# Patient Record
Sex: Male | Born: 1960
Health system: Southern US, Community
[De-identification: ages and names within clinical notes are randomized; demographics above are authoritative.]

## PROBLEM LIST (undated history)

## (undated) DIAGNOSIS — Z87442 Personal history of urinary calculi: Secondary | ICD-10-CM

## (undated) DIAGNOSIS — K219 Gastro-esophageal reflux disease without esophagitis: Secondary | ICD-10-CM

## (undated) DIAGNOSIS — N200 Calculus of kidney: Secondary | ICD-10-CM

## (undated) DIAGNOSIS — I10 Essential (primary) hypertension: Secondary | ICD-10-CM

## (undated) DIAGNOSIS — I2510 Atherosclerotic heart disease of native coronary artery without angina pectoris: Secondary | ICD-10-CM

## (undated) DIAGNOSIS — H7290 Unspecified perforation of tympanic membrane, unspecified ear: Secondary | ICD-10-CM

## (undated) DIAGNOSIS — G4730 Sleep apnea, unspecified: Secondary | ICD-10-CM

## (undated) HISTORY — PX: NASAL SEPTUM SURGERY: SHX37

## (undated) HISTORY — DX: Unspecified perforation of tympanic membrane, unspecified ear: H72.90

## (undated) HISTORY — DX: Sleep apnea, unspecified: G47.30

## (undated) HISTORY — PX: DENTAL SURGERY: SHX609

## (undated) HISTORY — DX: Gastro-esophageal reflux disease without esophagitis: K21.9

---

## 2006-12-07 ENCOUNTER — Ambulatory Visit (HOSPITAL_COMMUNITY): Admission: RE | Admit: 2006-12-07 | Discharge: 2006-12-07 | Payer: Self-pay | Admitting: Specialist

## 2010-01-11 ENCOUNTER — Encounter (INDEPENDENT_AMBULATORY_CARE_PROVIDER_SITE_OTHER): Payer: Self-pay | Admitting: *Deleted

## 2010-01-24 ENCOUNTER — Ambulatory Visit: Payer: Self-pay | Admitting: Gastroenterology

## 2010-01-28 ENCOUNTER — Ambulatory Visit (HOSPITAL_COMMUNITY): Admission: RE | Admit: 2010-01-28 | Discharge: 2010-01-28 | Payer: Self-pay | Admitting: Gastroenterology

## 2010-02-05 ENCOUNTER — Ambulatory Visit: Payer: Self-pay | Admitting: Gastroenterology

## 2010-02-12 ENCOUNTER — Encounter: Payer: Self-pay | Admitting: Gastroenterology

## 2010-03-18 ENCOUNTER — Telehealth: Payer: Self-pay | Admitting: Gastroenterology

## 2010-03-19 ENCOUNTER — Ambulatory Visit: Payer: Self-pay | Admitting: Gastroenterology

## 2010-04-23 ENCOUNTER — Ambulatory Visit
Admission: RE | Admit: 2010-04-23 | Discharge: 2010-04-23 | Payer: Self-pay | Source: Home / Self Care | Attending: Gastroenterology | Admitting: Gastroenterology

## 2010-04-23 ENCOUNTER — Encounter: Payer: Self-pay | Admitting: Gastroenterology

## 2010-04-24 ENCOUNTER — Encounter: Payer: Self-pay | Admitting: Gastroenterology

## 2010-05-14 NOTE — Assessment & Plan Note (Signed)
Summary: gas//bloating--ch.   History of Present Illness Visit Type: Initial Consult Primary GI MD: Melvia Heaps MD [facility] Primary Provider: Merri Brunette, MD Requesting Provider: Merri Brunette, MD Chief Complaint: Bloating  & gas x 3months History of Present Illness:   Luke Smith is a pleasant 50 year old white male referred at the request of Dr. Renne Crigler for evaluation of abdominal discomfort.  Approximately 3 months ago he and his wife developed an acute diarrheal illness accompanied  by nausea.  Since that time he has been complaining of excess belching and generalized abdominal discomfort.  He has had a few subsequent limited bouts of diarrhea.  He has rare pyrosis.  He recently  took an acid blocker without much improvement.  He has sleep apnea and uses CPAP at night.  He feels that this clearly causes more abdominal distention and excess belching.  He received a course of Flagyl without improvement.  He denies melena or hematochezia.  Incidentally noted were abnormal liver tests.  On January 10, 2010 AST was 63 and ALT 132 phosphatase was normal.  H. pylori antibody was negative.  Hemoccults x3 were negative ALT in August, 2011 was 76.  There is no history of hepatitis.  He did receive hepatitis vaccines.  He drinks up to 4 beers on the weekend.   GI Review of Systems    Reports abdominal pain, belching, and  bloating.     Location of  Abdominal pain: generalized.    Denies acid reflux, chest pain, dysphagia with liquids, dysphagia with solids, heartburn, loss of appetite, nausea, vomiting, vomiting blood, weight loss, and  weight gain.      Reports change in bowel habits.     Denies anal fissure, black tarry stools, constipation, diarrhea, diverticulosis, fecal incontinence, heme positive stool, hemorrhoids, irritable bowel syndrome, jaundice, light color stool, liver problems, rectal bleeding, and  rectal pain. Preventive Screening-Counseling & Management  Alcohol-Tobacco     Smoking  Status: quit      Drug Use:  no.      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  Alcohol Use - yes Daily Caffeine Use Illicit Drug Use - no Smoking Status:  quit Drug Use:  no  Review of Systems       The patient complains of back pain, headaches-new, and sleeping problems.  The patient denies allergy/sinus, anemia, anxiety-new, arthritis/joint pain, blood in urine, breast changes/lumps, change in vision, confusion, cough, coughing up blood, depression-new, fainting, fatigue, fever, hearing problems, heart murmur, heart rhythm changes, itching, menstrual pain, muscle pains/cramps, night sweats, nosebleeds, pregnancy symptoms, shortness of breath, skin rash, sore throat, swelling of feet/legs, swollen lymph glands, thirst - excessive , urination - excessive , urination changes/pain, urine leakage, vision changes, and voice change.         All other systems were reviewed and were negative   Vital Signs:  Patient profile:   50 year old male Height:      69 inches Weight:      186 pounds BMI:     27.57 Pulse rate:   64 / minute Pulse rhythm:   regular BP sitting:   128 / 80  (left arm) Cuff size:   regular  Vitals Entered  By: June McMurray CMA Duncan Dull) (January 24, 2010 10:43 AM)  Physical Exam  Additional Exam:  Physical Exam He Is a Well-Developed Well-Nourished Male  skin: anicteric HEENT: normocephalic; PEERLA; no nasal or pharyngeal abnormalities neck: supple nodes: no cervical lymphadenopathy chest: clear to ausculatation and percussion heart: no murmurs, gallops, or rubs abd: soft, nontender; BS normoactive; no abdominal masses, tenderness,  organomegaly; there is mild abdominal distention and minimal tympany.  There is no succussion splash. rectal: deferred ext: no cynanosis, clubbing, edema skeletal: no deformities neuro: oriented x 3; no focal abnormalities    Impression & Recommendations:  Problem # 1:  DYSPEPSIA (ICD-536.8) Assessment New He has nonspecific symptoms that suggest both upper GI and lower GI abnormalities.  There is a  mild change in bowel habits which raises the question of a lower GI structural abnormalityt.  Alternatively, he may have ulcer or nonulcer dyspepsia.  Recommendations #1 empiric therapy with   zegerid 40 mg q.h.s. #2 colonoscopy  Risks, alternatives, and complications of the procedure, including bleeding, perforation, and possible need for surgery, were explained to the patient.  Patient's questions were answered.  Problem # 2:  NONSPECIFIC ABNORMAL RESULTS LIVR FUNCTION STUDY (ICD-794.8)  Abnormal liver tests could be due to mild hepatic steatosis.  Recommendations #1 abdominal ultrasound  Orders: Ultrasound Abdomen (UAS) Colonoscopy (Colon)  Problem # 3:  SLEEP APNEA (ICD-780.57) Assessment: Comment Only  Patient Instructions: 1)  Copy sent to : Merri Brunette, MD 2)  Your colonoscopy is scheduled for 02/05/2010 at 4pm 3)  Your Ultrasound is scheduled for 01/28/2010 at 9am at [facility] 4)  The medication list was reviewed and reconciled.  All changed / newly prescribed medications were explained.  A complete medication list was provided to the patient / caregiver. 5)  Colonoscopy and Flexible Sigmoidoscopy brochure given.  6)  Conscious Sedation brochure given.  Prescriptions: ZEGERID 40-1100 MG CAPS (OMEPRAZOLE-SODIUM BICARBONATE) take one tab q.h.s.  #30 x 2   Entered and Authorized by:   Louis Meckel MD   Signed by:   Louis Meckel MD on 01/25/2010   Method used:   Electronically to        [facility] [phone]* (retail)       [address]        [place], [place]  [address]       Ph: [phone] or [phone]       Fax: [phone]   RxID:   [id number] MOVIPREP 100 GM  SOLR (PEG-KCL-NACL-NASULF-NA ASC-C) As per prep instructions.  #1 x 0   Entered by:   Merri Ray CMA (AAMA)   Authorized by:   Louis Meckel MD   Signed by:   Merri Ray CMA (AAMA) on 01/24/2010   Method used:   Electronically to        [facility] [phone]* (retail)       [address]       [place], [place]  [address]       Ph: [phone] or [phone]       Fax: [phone]   RxID:   [id number]

## 2010-05-14 NOTE — Progress Notes (Signed)
Summary: Triage  Phone Note Call from Patient Call back at [phone]   Caller: Patient Call For: Dr. Arlyce Dice Reason for Call: Talk to Nurse Summary of Call: Pt is having problems with his stomach, he is still having abdominal discomfort and bloating, would like to discuss with nurse, had a colon in october and is still having the same problems as before the procedure Initial call taken by: [place] Johnson,  March 18, 2010 11:33 AM  Follow-up for Phone Call        Spoke with patient and gave him appointment with Dr. Arlyce Dice tomorrow 03/19/10 11am.  Selinda Michaels, RN Follow-up by: Selinda Michaels RN,  March 18, 2010 1:53 PM

## 2010-05-14 NOTE — Procedures (Signed)
Summary: Colonoscopy  Patient: Luke Smith Note: All result statuses are Final unless otherwise noted.  Tests: (1) Colonoscopy (COL)   COL Colonoscopy           DONE     [facility]     [address]     [place], [place]  [address]           COLONOSCOPY PROCEDURE REPORT           PATIENT:  [name], [name]  MR#:  [id number]     BIRTHDATE:  [birth year], 49 yrs. old  GENDER:  male           ENDOSCOPIST:  Barbette Hair. Arlyce Dice, MD     Referred by:           PROCEDURE DATE:  02/05/2010     PROCEDURE:  Colonoscopy with snare polypectomy     ASA CLASS:  Class II     INDICATIONS:  1) Abdominal pain           MEDICATIONS:   Fentanyl 75 mcg IV, Versed 7 mg IV           DESCRIPTION OF PROCEDURE:   After the risks benefits and     alternatives of the procedure were thoroughly explained, informed     consent was obtained.  Digital rectal exam was performed and     revealed no abnormalities.   The LB CF-[id number] endoscope     was introduced through the anus and advanced to the cecum, which     was identified by both the appendix and ileocecal valve, without     limitations.  The quality of the prep was excellent, using     MoviPrep.  The instrument was then slowly withdrawn as the colon     was fully examined.     <<PROCEDUREIMAGES>>           FINDINGS:  A sessile polyp was found in the descending colon. It     was 4 mm in size. Polyp was snared without cautery. Retrieval was     successful (see image13 and image15). snare polyp  Scattered     diverticula were found (see image14 and image16). sigmoid to     transverse colon  This was otherwise a normal examination of the     colon (see image2, image3, image4, image8, image11, image17, and     image18).   Retroflexed views in the rectum revealed no     abnormalities.    The time to cecum =  2.75  minutes. The scope     was then withdrawn (time =  9.75  min) from the patient and the     procedure completed.        COMPLICATIONS:  None           ENDOSCOPIC IMPRESSION:     1) 4 mm sessile polyp in the descending colon     2) Diverticula, scattered     3) Otherwise normal examination           No findings to explain abdominal discomfort.     RECOMMENDATIONS:     1) If the polyp(s) removed today are proven to be adenomatous     (pre-cancerous) polyps, you will need a repeat colonoscopy in 5     years. Otherwise you should continue to follow colorectal cancer     screening guidelines for "routine risk" patients with colonoscopy     in 10 years.  2) call office next 1-3 days to schedule followup visit in 3-4     weeks     3) begin zegerid at bedtime           REPEAT EXAM:   You will receive a letter from Dr. Arlyce Dice in 1-2     weeks, after reviewing the final pathology, with followup     recommendations.           ______________________________     Barbette Hair Arlyce Dice, MD           CC: Romero Liner, MD           n.     Rosalie Doctor:   Barbette Hair. Kaplan at 02/05/2010 04:03 PM           Page 2 of 3   Assad, [place], [phone]  Note: An exclamation mark (!) indicates a result that was not dispersed into the flowsheet. Document Creation Date: 02/05/2010 4:02 PM _______________________________________________________________________  (1) Order result status: Final Collection or observation date-time: 02/05/2010 15:55 Requested date-time:  Receipt date-time:  Reported date-time:  Referring Physician:   Ordering Physician: Melvia Heaps [phone]) Specimen Source:  Source: Launa Grill Order Number: [id number] Lab site:   Appended Document: Colonoscopy     Procedures Next Due Date:    Colonoscopy: 01/2015

## 2010-05-14 NOTE — Letter (Signed)
Summary: Patient Notice- Polyp Results   Gastroenterology  [address] [place], [place] [address]   Phone: [phone]  Fax: [phone]        February 12, 2010 MRN: [id number]    BRANTLY KALMAN [address] [place], [place]  [address]    Dear Mr. Romey,  I am pleased to inform you that the colon polyp(s) removed during your recent colonoscopy was (were) found to be benign (no cancer detected) upon pathologic examination.  I recommend you have a repeat colonoscopy examination in _5 years to look for recurrent polyps, as having colon polyps increases your risk for having recurrent polyps or even colon cancer in the future.  Should you develop new or worsening symptoms of abdominal pain, bowel habit changes or bleeding from the rectum or bowels, please schedule an evaluation with either your primary care physician or with me.  Additional information/recommendations:  __ No further action with gastroenterology is needed at this time. Please      follow-up with your primary care physician for your other healthcare      needs.  __ Please call [phone] to schedule a return visit to review your      situation.  __ Please keep your follow-up visit as already scheduled.  __x Continue treatment plan as outlined the day of your exam.  Please call us if you are having persistent problems or have questions about your condition that have not been fully answered at this time.  Sincerely,  Louis Meckel MD  This letter has been electronically signed by your physician.  Appended Document: Patient Notice- Polyp Results letter mailed

## 2010-05-14 NOTE — Letter (Signed)
Summary: Results Letter  [facility]  [address] [place], [place] [address]   Phone: [phone]  Fax: [phone]        January 24, 2010 MRN: [id number]    SAVALAS MONJE [address] [place], [place]  [address]    Dear Mr. Holquin,  It is my pleasure to have treated you recently as a new patient in my office. I appreciate your confidence and the opportunity to participate in your care.  Since I do have a busy inpatient endoscopy schedule and office schedule, my office hours vary weekly. I am, however, available for emergency calls everyday through my office. If I am not available for an urgent office appointment, another one of our gastroenterologist will be able to assist you.  My well-trained staff are prepared to help you at all times. For emergencies after office hours, a physician from our Gastroenterology section is always available through my 24 hour answering service  Once again I welcome you as a new patient and I look forward to a happy and healthy relationship             Sincerely,  Louis Meckel MD  This letter has been electronically signed by your physician.  Appended Document: Results Letter LETTER MAILED

## 2010-05-14 NOTE — Letter (Signed)
Summary: [facility] Instructions  [facility]  [address] [place], [place] [address]   Phone: [phone]  Fax: [phone]       Luke Smith    Sep 02, 1960    MRN: [id number]        Procedure Day /Date:TUESDAY 02/05/2010     Arrival Time:3PM     Procedure Time:4PM     Location of Procedure:                    X   [facility] (4th Floor)  PREPARATION FOR COLONOSCOPY WITH MOVIPREP   Starting 5 days prior to your procedure 10/20do not eat nuts, seeds, popcorn, corn, beans, peas,  salads, or any raw vegetables.  Do not take any fiber supplements (e.g. Metamucil, Citrucel, and Benefiber).  THE DAY BEFORE YOUR PROCEDURE         DATE: 02/04/2010  DAY: MONDAY  1.  Drink clear liquids the entire day-NO SOLID FOOD  2.  Do not drink anything colored red or purple.  Avoid juices with pulp.  No orange juice.  3.  Drink at least 64 oz. (8 glasses) of fluid/clear liquids during the day to prevent dehydration and help the prep work efficiently.  CLEAR LIQUIDS INCLUDE: Water Jello Ice Popsicles Tea (sugar ok, no milk/cream) Powdered fruit flavored drinks Coffee (sugar ok, no milk/cream) Gatorade Juice: apple, white grape, white cranberry  Lemonade Clear bullion, consomm, broth Carbonated beverages (any kind) Strained chicken noodle soup Hard Candy                             4.  In the morning, mix first dose of MoviPrep solution:    Empty 1 Pouch A and 1 Pouch B into the disposable container    Add lukewarm drinking water to the top line of the container. Mix to dissolve    Refrigerate (mixed solution should be used within 24 hrs)  5.  Begin drinking the prep at 5:00 p.m. The MoviPrep container is divided by 4 marks.   Every 15 minutes drink the solution down to the next mark (approximately 8 oz) until the full liter is complete.   6.  Follow completed prep with 16 oz of clear liquid of your choice (Nothing red or purple).  Continue to drink  clear liquids until bedtime.  7.  Before going to bed, mix second dose of MoviPrep solution:    Empty 1 Pouch A and 1 Pouch B into the disposable container    Add lukewarm drinking water to the top line of the container. Mix to dissolve    Refrigerate  THE DAY OF YOUR PROCEDURE      DATE: 10/25/2011DAY: TUESDAY  Beginning at 11a.m. (5 hours before procedure):         1. Every 15 minutes, drink the solution down to the next mark (approx 8 oz) until the full liter is complete.  2. Follow completed prep with 16 oz. of clear liquid of your choice.    3. You may drink clear liquids until 2PM (2 HOURS BEFORE PROCEDURE).   MEDICATION INSTRUCTIONS  Unless otherwise instructed, you should take regular prescription medications with a small sip of water   as early as possible the morning of your procedure.        OTHER INSTRUCTIONS  You will need a responsible adult at least 49 years of age to accompany you and drive you home.  This person must remain in the waiting room during your procedure.  Wear loose fitting clothing that is easily removed.  Leave jewelry and other valuables at home.  However, you may wish to bring a book to read or  an iPod/MP3 player to listen to music as you wait for your procedure to start.  Remove all body piercing jewelry and leave at home.  Total time from sign-in until discharge is approximately 2-3 hours.  You should go home directly after your procedure and rest.  You can resume normal activities the  day after your procedure.  The day of your procedure you should not:   Drive   Make legal decisions   Operate machinery   Drink alcohol   Return to work  You will receive specific instructions about eating, activities and medications before you leave.    The above instructions have been reviewed and explained to me by   _______________________    I fully understand and can verbalize these instructions _____________________________  Date _________

## 2010-05-14 NOTE — Letter (Signed)
Summary: New Patient letter  [facility]  [address] [place], [place] [address]   Phone: [phone]  Fax: [phone]       01/11/2010 MRN: [id number]  Luke Smith [address] [place], [place]  [address]  Dear Luke Smith,  Welcome to the Gastroenterology Division at [facility].    You are scheduled to see Dr.  Arlyce Dice on 03-04-10 at 9:00a.m. on the 3rd floor at [facility], [address]  We ask that you try to arrive at our office 15 minutes prior to your appointment time to allow for check-in.  We would like you to complete the enclosed self-administered evaluation form prior to your visit and bring it with you on the day of your appointment.  We will review it with you.  Also, please bring a complete list of all your medications or, if you prefer, bring the medication bottles and we will list them.  Please bring your insurance card so that we may make a copy of it.  If your insurance requires a referral to see a specialist, please bring your referral form from your primary care physician.  Co-payments are due at the time of your visit and may be paid by cash, check or credit card.     Your office visit will consist of a consult with your physician (includes a physical exam), any laboratory testing he/she may order, scheduling of any necessary diagnostic testing (e.g. x-ray, ultrasound, CT-scan), and scheduling of a procedure (e.g. Endoscopy, Colonoscopy) if required.  Please allow enough time on your schedule to allow for any/all of these possibilities.    If you cannot keep your appointment, please call [phone] to cancel or reschedule prior to your appointment date.  This allows [place] the opportunity to schedule an appointment for another patient in need of care.  If you do not cancel or reschedule by 5 p.m. the business day prior to your appointment date, you will be charged a $50.00 late cancellation/no-show fee.    Thank you for choosing  [facility] for your medical needs.  We appreciate the opportunity to care for you.  Please visit [place] at our website  to learn more about our practice.                     Sincerely,                                                             The Gastroenterology Division

## 2010-05-14 NOTE — Letter (Signed)
Summary: EGD Instructions  [facility]  [address] [place], [place] [address]   Phone: [phone]  Fax: [phone]       Luke Smith    [birth year]    MRN: [id number]       Procedure Day /Date:TUESDAY 04/23/2010     Arrival Time: 2PM     Procedure Time:3PM     Location of Procedure:                    X  [facility] (4th Floor)  PREPARATION FOR ENDOSCOPY   On 04/23/2010 THE DAY OF THE PROCEDURE:  1.   No solid foods, milk or milk products are allowed after midnight the night before your procedure.  2.   Do not drink anything colored red or purple.  Avoid juices with pulp.  No orange juice.  3.  You may drink clear liquids until1PM, which is 2 hours before your procedure.                                                                                                CLEAR LIQUIDS INCLUDE: Water Jello Ice Popsicles Tea (sugar ok, no milk/cream) Powdered fruit flavored drinks Coffee (sugar ok, no milk/cream) Gatorade Juice: apple, white grape, white cranberry  Lemonade Clear bullion, consomm, broth Carbonated beverages (any kind) Strained chicken noodle soup Hard Candy   MEDICATION INSTRUCTIONS  Unless otherwise instructed, you should take regular prescription medications with a small sip of water as early as possible the morning of your procedure           OTHER INSTRUCTIONS  You will need a responsible adult at least 49 years of age to accompany you and drive you home.   This person must remain in the waiting room during your procedure.  Wear loose fitting clothing that is easily removed.  Leave jewelry and other valuables at home.  However, you may wish to bring a book to read or an iPod/MP3 player to listen to music as you wait for your procedure to start.  Remove all body piercing jewelry and leave at home.  Total time from sign-in until discharge is approximately 2-3 hours.  You should go home directly after your  procedure and rest.  You can resume normal activities the day after your procedure.  The day of your procedure you should not:   Drive   Make legal decisions   Operate machinery   Drink alcohol   Return to work  You will receive specific instructions about eating, activities and medications before you leave.    The above instructions have been reviewed and explained to me by   _______________________    I fully understand and can verbalize these instructions _____________________________ Date _________

## 2010-05-16 NOTE — Letter (Signed)
Summary: Appt Reminder 2  [facility]  [address] [place], [place] [address]   Phone: [phone]  Fax: [phone]        April 24, 2010 MRN: [id number]    DARYLL SPISAK [address] [place], [place]  [address]    Dear Mr. Pelzer,   You have a return appointment with Dr. Arlyce Dice on 05/27/10 at 9:15am.  Please remember to bring a complete list of the medicines you are taking, your insurance card and your co-pay.  If you have to cancel or reschedule this appointment, please call before 5:00 pm the evening before to avoid a cancellation fee.  If you have any questions or concerns, please call [phone].    Sincerely,    Selinda Michaels RN  Appended Document: Appt Reminder 2 Letter is mailed to the patient's home address

## 2010-05-16 NOTE — Procedures (Signed)
Summary: Upper Endoscopy  Patient: Luke Smith Note: All result statuses are Final unless otherwise noted.  Tests: (1) Upper Endoscopy (EGD)   EGD Upper Endoscopy       DONE     [facility]     [address]     [place], [place]  [address]           ENDOSCOPY PROCEDURE REPORT           PATIENT:  Luke Smith, Luke Smith  MR#:  [id number]     BIRTHDATE:  1961/02/02, 49 yrs. old  GENDER:  male           ENDOSCOPIST:  Barbette Hair. Arlyce Dice, MD     Referred by:  Soyla Murphy. Renne Crigler, M.D.           PROCEDURE DATE:  04/23/2010     PROCEDURE:  EGD, diagnostic 43235     ASA CLASS:  Class II     INDICATIONS:  dyspepsia           MEDICATIONS:   Fentanyl 50 mcg IV, Versed 5 mg IV, glycopyrrolate     (Robinal) 0.2 mg IV, 0.6cc simethancone 0.6 cc PO     TOPICAL ANESTHETIC:  Exactacain Spray           DESCRIPTION OF PROCEDURE:   After the risks benefits and     alternatives of the procedure were thoroughly explained, informed     consent was obtained.  The [facility] [id number] [id number] endoscope was     introduced through the mouth and advanced to the third portion of     the duodenum, without limitations.  The instrument was slowly     withdrawn as the mucosa was fully examined.     <<PROCEDUREIMAGES>>           The upper, middle, and distal third of the esophagus were     carefully inspected and no abnormalities were noted. The z-line     was well seen at the GEJ. The endoscope was pushed into the fundus     which was normal including a retroflexed view. The antrum,gastric     body, first and second part of the duodenum were unremarkable (see     image1, image2, image3, image4, image5, image6, and image7).     Retroflexed views revealed no abnormalities.    The scope was then     withdrawn from the patient and the procedure completed.     COMPLICATIONS:  None           ENDOSCOPIC IMPRESSION:     1) Normal EGD     RECOMMENDATIONS:     1) Trial of amitiza bid     2) Call office next 2-3 days to  schedule an office appointment for     3-4 weeks           REPEAT EXAM:  No           ______________________________     Barbette Hair. Arlyce Dice, MD           CC:           n.     eSIGNED:   Barbette Hair. Kaplan at 04/23/2010 02:58 PM           Phoebe Perch, [id number]  Note: An exclamation mark (!) indicates a result that was not dispersed into the flowsheet. Document Creation Date: 04/23/2010 2:58 PM _______________________________________________________________________  (1) Order result status: Final Collection or observation date-time: 04/23/2010  14:53 Requested date-time:  Receipt date-time:  Reported date-time:  Referring Physician:   Ordering Physician: Melvia Heaps [phone]) Specimen Source:  Source: Launa Grill Order Number: [id number] Lab site:

## 2010-05-16 NOTE — Assessment & Plan Note (Signed)
Summary: Abdominal discomfort/LRH   History of Present Illness Visit Type: Follow-up Visit Primary GI MD: Melvia Heaps MD [facility] Primary Provider: Merri Brunette, MD Requesting Provider: Merri Brunette, MD Chief Complaint: abdominal discomfort  History of Present Illness:   Luke Smith has returned for followup of his abdominal complaints.  Colonoscopy demonstrated a 4 mm descending colon polyp which was removed.  Adenomatous changes were seen.  He continues to complain of postprandial bloating, distention and mild discomfort.  Symptoms did not improve with empiric therapy with Zegerid for one week.   He denies nausea or pyrosis.  He has excess belching.   GI Review of Systems    Reports abdominal pain, acid reflux, and  chest pain.      Denies belching, bloating, dysphagia with liquids, dysphagia with solids, heartburn, loss of appetite, nausea, vomiting, vomiting blood, weight loss, and  weight gain.        Denies anal fissure, black tarry stools, change in bowel habit, constipation, diarrhea, diverticulosis, fecal incontinence, heme positive stool, hemorrhoids, irritable bowel syndrome, jaundice, light color stool, liver problems, rectal bleeding, and  rectal pain.    Current Medications (verified): 1)  Fish Oil 1000 Mg Caps (Omega-3 Fatty Acids) .... 2 By Mouth Once Daily 2)  Vitamin C 1000 Mg Tabs (Ascorbic Acid) .Marland Kitchen.. 1 By Mouth Once Daily 3)  Vitamin D3 5000 Unit Caps (Cholecalciferol) .Marland Kitchen.. 1 By Mouth Once Daily  Allergies (verified): 1)  ! Aspirin  Past History:  Past Medical History: Reviewed history from 01/24/2010 and no changes required. Pneumonia Sleep Apnea  Past Surgical History: Reviewed history from 01/24/2010 and no changes required. Oral surgery  Family History: Reviewed history from 01/24/2010 and no changes required. Family History of Heart Disease:  Bone Cancer: Father No FH of Colon Cancer:  Social History: Reviewed history from 01/24/2010 and no changes  required. 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  Alcohol Use - yes Daily Caffeine Use Illicit Drug Use - no  Review of Systems       The patient complains of cough and sleeping problems.  The patient denies allergy/sinus, anemia, anxiety-new, arthritis/joint pain, back pain, blood in urine, breast changes/lumps, change in vision, confusion, coughing up blood, depression-new, fainting, fatigue, fever, headaches-new, hearing problems, heart murmur, heart rhythm changes, itching, muscle pains/cramps, night sweats, nosebleeds, shortness of breath, skin rash, sore throat, swelling of feet/legs, swollen lymph glands, thirst - excessive, urination - excessive, urination changes/pain, urine leakage, vision changes, and voice change.    Vital Signs:  Patient profile:   50 year old male Height:      69 inches Weight:      186 pounds BMI:     27.57 Pulse rate:   104 / minute Pulse rhythm:   regular BP sitting:   138 / 74  (left arm)  Vitals Entered By: Luke Smith NCMA (March 19, 2010 10:41 AM)  Physical Exam  Additional Exam:  On exam he is a well-developed well-nourished male  Abdominal exam there is mild distention with tympany.  There is no succussion splash.  There are no abdominal masses or organomegaly   Impression & Recommendations:  Problem # 1:  DYSPEPSIA (ICD-536.8) Assessment Unchanged  Plan upper endoscopy.  Patient was also instructed to try an over-the-counter H2 receptor antagonist  Orders: EGD (EGD)  Problem # 2:  PERSONAL HISTORY OF COLONIC POLYPS (ICD-V12.72) Plan followup colonoscopy in 2016  Problem # 3:  NONSPECIFIC ABNORMAL RESULTS LIVR FUNCTION STUDY (ICD-794.8)  Ultrasound  shows mild hepatic steatosis which would account for his LFT abnormalities.  Orders: EGD (EGD)  Patient Instructions: 1)  Copy sent to : Merri Brunette, MD 2)  Your EGD is scheduled on 04/23/2010 at 3pm 3)  Conscious Sedation brochure given.  4)  Upper  Endoscopy brochure given.  5)  The medication list was reviewed and reconciled.  All changed / newly prescribed medications were explained.  A complete medication list was provided to the patient / caregiver.

## 2010-05-16 NOTE — Miscellaneous (Signed)
  Clinical Lists Changes  Medications: Added new medication of AMITIZA 8 MCG CAPS (LUBIPROSTONE) take 1 tab two times a day - Signed Rx of AMITIZA 8 MCG CAPS (LUBIPROSTONE) take 1 tab two times a day;  #15 x 2;  Signed;  Entered by: Louis Meckel MD;  Authorized by: Louis Meckel MD;  Method used: [facility]  [address] [phone] [address] [place], [place], [place]  [address], Ph: [phone] or [phone], Fax: [phone]    Prescriptions: AMITIZA 8 MCG CAPS (LUBIPROSTONE) take 1 tab two times a day  #15 x 2   Entered and Authorized by:   Louis Meckel MD   Signed by:   Louis Meckel MD on 04/23/2010   Method used:   [facility]ron[facility] to        [facility]  [address] [phone] (retail)       [address] [place], [place]  [address]       Ph: [phone] or [phone]       Fax: [phone]   RxID:   [id number]

## 2010-05-27 ENCOUNTER — Ambulatory Visit (INDEPENDENT_AMBULATORY_CARE_PROVIDER_SITE_OTHER): Payer: BC Managed Care – PPO | Admitting: Gastroenterology

## 2010-05-27 ENCOUNTER — Encounter: Payer: Self-pay | Admitting: Gastroenterology

## 2010-05-27 DIAGNOSIS — K3189 Other diseases of stomach and duodenum: Secondary | ICD-10-CM

## 2010-05-27 DIAGNOSIS — R1013 Epigastric pain: Secondary | ICD-10-CM

## 2010-06-05 NOTE — Assessment & Plan Note (Signed)
Summary: EGD FU   History of Present Illness Visit Type: Follow-up Visit Primary GI MD: Melvia Heaps MD [facility] Primary Provider: Merri Brunette, MD Requesting Provider: Merri Brunette, MD Chief Complaint: abdominal pain LLQ, now and then History of Present Illness:   Mr. Luke Smith has returned for followup of his abdominal pain.  He is symptom-free.  He attributes his symptoms to CPAP where he feels that he was swallowing a great deal of air.  He has been off CPAP and has noted a significant improvement.  Amitiza caused diarrhea.   GI Review of Systems    Reports abdominal pain and  bloating.     Location of  Abdominal pain: LUQ.    Denies acid reflux, belching, chest pain, dysphagia with liquids, dysphagia with solids, heartburn, loss of appetite, nausea, vomiting, vomiting blood, weight loss, and  weight gain.        Denies anal fissure, black tarry stools, change in bowel habit, constipation, diarrhea, diverticulosis, fecal incontinence, heme positive stool, hemorrhoids, irritable bowel syndrome, jaundice, light color stool, liver problems, rectal bleeding, and  rectal pain.    Current Medications (verified): 1)  Fish Oil 1000 Mg Caps (Omega-3 Fatty Acids) .... 2 By Mouth Once Daily 2)  Vitamin C 1000 Mg Tabs (Ascorbic Acid) .Marland Kitchen.. 1 By Mouth Once Daily 3)  Vitamin D3 5000 Unit Caps (Cholecalciferol) .Marland Kitchen.. 1 By Mouth Once Daily  Allergies (verified): 1)  ! Aspirin  Past History:  Past Medical History: Reviewed history from 01/24/2010 and no changes required. Pneumonia Sleep Apnea  Past Surgical History: Reviewed history from 01/24/2010 and no changes required. Oral surgery  Family History: Reviewed history from 01/24/2010 and no changes required. Family History of Heart Disease:  Bone Cancer: Father No FH of Colon Cancer:  Social History: Reviewed history from 01/24/2010 and no changes required. 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  Alcohol Use -  yes Daily Caffeine Use Illicit Drug Use - no  Review of Systems  The patient denies allergy/sinus, anemia, anxiety-new, arthritis/joint pain, back pain, blood in urine, breast changes/lumps, change in vision, confusion, cough, coughing up blood, depression-new, fainting, fatigue, fever, headaches-new, hearing problems, heart murmur, heart rhythm changes, itching, menstrual pain, muscle pains/cramps, night sweats, nosebleeds, pregnancy symptoms, shortness of breath, skin rash, sleeping problems, sore throat, swelling of feet/legs, swollen lymph glands, thirst - excessive , urination - excessive , urination changes/pain, urine leakage, vision changes, and voice change.    Vital Signs:  Patient profile:   50 year old male Height:      69 inches Weight:      188.25 pounds BMI:     27.90 Pulse rate:   80 / minute Pulse rhythm:   regular BP sitting:   140 / 92  (left arm) Cuff size:   regular  Vitals Entered By: June McMurray CMA Duncan Dull) (May 27, 2010 9:22 AM)   Impression & Recommendations:  Problem # 1:  DYSPEPSIA (ICD-536.8) This is likely related to aerophagia secondary to CPAP.  Recommendations #1 I instructed the patient to talk with Dr. Renne Crigler regarding CPAP and the manipulation of the apparatus #2 simethicone p.r.n.  Patient Instructions: 1)  Copy sent to : Merri Brunette, MD 2)  Call back as needed  3)  The medication list was reviewed and reconciled.  All changed / newly prescribed medications were explained.  A complete medication list was provided to the patient / caregiver.

## 2011-12-08 ENCOUNTER — Other Ambulatory Visit: Payer: Self-pay | Admitting: Otolaryngology

## 2011-12-19 ENCOUNTER — Ambulatory Visit (HOSPITAL_BASED_OUTPATIENT_CLINIC_OR_DEPARTMENT_OTHER): Payer: BC Managed Care – PPO | Attending: Otolaryngology

## 2011-12-19 VITALS — Ht 69.0 in | Wt 185.0 lb

## 2011-12-19 DIAGNOSIS — Z9989 Dependence on other enabling machines and devices: Secondary | ICD-10-CM

## 2011-12-19 DIAGNOSIS — G4733 Obstructive sleep apnea (adult) (pediatric): Secondary | ICD-10-CM

## 2011-12-27 DIAGNOSIS — G4733 Obstructive sleep apnea (adult) (pediatric): Secondary | ICD-10-CM

## 2011-12-28 NOTE — Procedures (Signed)
NAME:  Luke Smith, LOWERS NO.:  [id number]  MEDICAL RECORD NO.:  [id number]          PATIENT TYPE:  OUT  LOCATION:  SLEEP CENTER                 FACILITY:  [facility]  PHYSICIAN:  Cierah Crader D. Maple Hudson, MD, FCCP, FACPDATE OF BIRTH:  1960-08-26  DATE OF STUDY:  12/19/2011                           NOCTURNAL POLYSOMNOGRAM  REFERRING PHYSICIAN:  Newman Pies, MD  INDICATION FOR STUDY:  Hypersomnia with sleep apnea.  EPWORTH SLEEPINESS SCORE:  9/24.  BMI 27.3, weight 185 pounds, height 69 inches, neck 15 inches.  MEDICATIONS:  Home medications are charted as "none."  SLEEP ARCHITECTURE:  Split study protocol.  During the diagnostic phase, total sleep time minutes with sleep efficiency 93.8%.  Stage I was 2.9%, stage II 97.1%, stage III absent.  REM absent.  Sleep latency 6.5 minutes, awake after sleep onset 1.5 minutes, arousal index 65.  Bedtime Medication:  Ambien.  RESPIRATORY DATA:  Split study protocol.  Apnea-hypopnea index (AHI) 77.5 per hour.  A total of 155 events was scored including 53 obstructive apneas, 3 central apneas, 96 mixed apneas.  Events were associated with nonsupine sleep position.  CPAP was then titrated to 10 CWP, AHI 0 per hour.  He wore a 1 size [employer] Pilairo mask with heated humidifier.  OXYGEN DATA:  Before CPAP, snoring was moderate with oxygen desaturation to a nadir of 63% on room air.  With CPAP titration, mean oxygen saturation held 95.1% on room air and snoring was prevented.  CARDIAC DATA:  Normal sinus rhythm.  MOVEMENT-PARASOMNIA:  No significant movement disturbance.  No bathroom trips.  IMPRESSIONS-RECOMMENDATIONS: 1. Severe obstructive sleep apnea/hypopnea syndrome, AHI 77.5 per hour     with non-supine events.  Moderate snoring and oxygen desaturation     to a nadir of 63% on room air. 2. Successful CPAP titration to 10 CWP, AHI 0 per hour.  He wore a One     Size [employer] Paykel Pilairo mask with heated humidifier.   Snoring     was prevented and mean oxygen saturation held 95.7% on room air.     Aftin Lye D. Maple Hudson, MD, [facility], FACP Diplomate, American Board of Sleep Medicine    CDY/MEDQ  D:  12/27/2011 09:21:15  T:  12/28/2011 04:01:58  Job:  [id number]

## 2011-12-29 ENCOUNTER — Encounter (HOSPITAL_COMMUNITY): Payer: Self-pay | Admitting: Respiratory Therapy

## 2011-12-30 ENCOUNTER — Encounter (HOSPITAL_BASED_OUTPATIENT_CLINIC_OR_DEPARTMENT_OTHER): Payer: BC Managed Care – PPO

## 2011-12-31 ENCOUNTER — Encounter (HOSPITAL_COMMUNITY): Admission: RE | Admit: 2011-12-31 | Payer: BC Managed Care – PPO | Source: Ambulatory Visit

## 2012-01-07 ENCOUNTER — Encounter (HOSPITAL_COMMUNITY): Admission: RE | Payer: Self-pay | Source: Ambulatory Visit

## 2012-01-07 ENCOUNTER — Ambulatory Visit (HOSPITAL_COMMUNITY): Admission: RE | Admit: 2012-01-07 | Payer: BC Managed Care – PPO | Source: Ambulatory Visit | Admitting: Otolaryngology

## 2012-01-07 SURGERY — UPPP (UVULOPALATOPHARYNGOPLASTY)
Anesthesia: General

## 2012-08-24 ENCOUNTER — Other Ambulatory Visit: Payer: Self-pay | Admitting: Registered Nurse

## 2012-09-02 ENCOUNTER — Encounter: Payer: Self-pay | Admitting: Cardiovascular Disease

## 2012-09-02 ENCOUNTER — Ambulatory Visit (INDEPENDENT_AMBULATORY_CARE_PROVIDER_SITE_OTHER): Payer: BC Managed Care – PPO | Admitting: Cardiovascular Disease

## 2012-09-02 ENCOUNTER — Encounter: Payer: Self-pay | Admitting: Cardiology

## 2012-09-02 VITALS — BP 116/80 | HR 76 | Ht 69.0 in | Wt 186.0 lb

## 2012-09-02 DIAGNOSIS — R9439 Abnormal result of other cardiovascular function study: Secondary | ICD-10-CM

## 2012-09-02 NOTE — Patient Instructions (Signed)
Your physician wants you to follow-up in:  12 months.  You will receive a reminder letter in the mail two months in advance. If you don't receive a letter, please call our office to schedule the follow-up appointment.   

## 2012-09-02 NOTE — Assessment & Plan Note (Signed)
Patient had a met test that showed an ischemic response with a flattened VO2 curve and elevated heart rate suggesting microvascular ischemia

## 2012-09-02 NOTE — Progress Notes (Signed)
09/02/2012 AUDIEL SCHEIBER   1960/07/21  [id number]  Primary Physician Londell Moh, MD Primary Cardiologist: Runell Gess MD Roseanne Reno   HPI:  Mr. Mizrahi is a playful fit-appearing 52 year old married Caucasian male father of 2 who works as a Sales promotion account executive at [employer]. He was referred by Dr. Merri Brunette for cardiovascular evaluation because of an abnormal MET test. His Crestor such profiles positive for remote tobacco abuse having quit 11 years ago and smoked 10 pack years prior to that. There is no family history of heart disease. Never had a heart attack or stroke. He does have obstructive sleep apnea intolerant to CPAP. He specifically denies chest pain or shortness of breath.   Current Outpatient Prescriptions  Medication Sig Dispense Refill  . Cholecalciferol (VITAMIN D3) 5000 UNITS TABS Take 5,000 Units by mouth daily.      . fish oil-omega-3 fatty acids 1000 MG capsule Take 1 g by mouth daily.      Marland Kitchen HYDROcodone-acetaminophen (NORCO/VICODIN) 5-325 MG per tablet Take 1 tablet by mouth at bedtime as needed.      . methocarbamol (ROBAXIN) 500 MG tablet Take 1 tablet by mouth at bedtime as needed.      . zolpidem (AMBIEN) 10 MG tablet Take 1 tablet by mouth at bedtime as needed.       No current facility-administered medications for this visit.    Allergies  Allergen Reactions  . Aspirin Other (See Comments)    REACTION: stomach upset    History   Social History  . Marital Status: Married    Spouse Name: N/A    Number of Children: N/A  . Years of Education: N/A   Occupational History  . Not on file.   Social History Main Topics  . Smoking status: Former Smoker -- 0.50 packs/day for 20 years    Types: Cigarettes    Quit date: 04/14/2001  . Smokeless tobacco: Not on file  . Alcohol Use: 3.6 oz/week    6 Cans of beer per week  . Drug Use: No  . Sexually Active: Not on file   Other Topics Concern  . Not on file    Social History Narrative  . No narrative on file     Review of Systems: General: negative for chills, fever, night sweats or weight changes.  Cardiovascular: negative for chest pain, dyspnea on exertion, edema, orthopnea, palpitations, paroxysmal nocturnal dyspnea or shortness of breath Dermatological: negative for rash Respiratory: negative for cough or wheezing Urologic: negative for hematuria Abdominal: negative for nausea, vomiting, diarrhea, bright red blood per rectum, melena, or hematemesis Neurologic: negative for visual changes, syncope, or dizziness All other systems reviewed and are otherwise negative except as noted above.    Blood pressure 116/80, pulse 76, height 5\' 9"  (1.753 m), weight 186 lb (84.369 kg).  General appearance: alert and no distress Neck: no adenopathy, no carotid bruit, no JVD, supple, symmetrical, trachea midline and thyroid not enlarged, symmetric, no tenderness/mass/nodules Lungs: clear to auscultation bilaterally Heart: regular rate and rhythm, S1, S2 normal, no murmur, click, rub or gallop Abdomen: soft, non-tender; bowel sounds normal; no masses,  no organomegaly Extremities: extremities normal, atraumatic, no cyanosis or edema Pulses: 2+ and symmetric  EKG normal sinus rhythm without ST or T wave changes.  ASSESSMENT AND PLAN:   Abnormal stress test Patient had a met test that showed an ischemic response with a flattened VO2 curve and elevated heart rate suggesting microvascular ischemia  Runell Gess MD FACP,FACC,FAHA, [facility] 09/02/2012 4:43 PM

## 2014-12-22 ENCOUNTER — Encounter: Payer: Self-pay | Admitting: Gastroenterology

## 2014-12-26 ENCOUNTER — Encounter: Payer: Self-pay | Admitting: Gastroenterology

## 2015-02-07 ENCOUNTER — Telehealth: Payer: Self-pay | Admitting: Gastroenterology

## 2015-02-08 NOTE — Telephone Encounter (Signed)
Happy to see him.  Please offer my next available ngi appt, not with extender and please do not double book. thanks

## 2015-02-08 NOTE — Telephone Encounter (Signed)
Please see Dr. Ardis Hughs note below and schedule pt

## 2015-02-08 NOTE — Telephone Encounter (Signed)
Previous Deatra Ina pt requesting to see Dr. Ardis Hughs. Pt was offered Nandigam and Armbruster but would like to see Dr. Ardis Hughs. Will you accept this pt? Please advise.

## 2015-11-26 ENCOUNTER — Telehealth: Payer: Self-pay | Admitting: Cardiology

## 2015-11-26 NOTE — Telephone Encounter (Signed)
Received records from [facility] for appointment on 11/29/15 with Kerin Ransom, [place].  Records given to [employer] (medical records) for Luke's schedule on 11/29/15. lp

## 2015-11-29 ENCOUNTER — Encounter: Payer: Self-pay | Admitting: Cardiology

## 2015-11-29 ENCOUNTER — Ambulatory Visit (INDEPENDENT_AMBULATORY_CARE_PROVIDER_SITE_OTHER): Payer: BLUE CROSS/BLUE SHIELD | Admitting: Cardiology

## 2015-11-29 VITALS — BP 140/88 | HR 79 | Ht 69.0 in | Wt 192.4 lb

## 2015-11-29 DIAGNOSIS — G4730 Sleep apnea, unspecified: Secondary | ICD-10-CM | POA: Diagnosis not present

## 2015-11-29 DIAGNOSIS — Z87891 Personal history of nicotine dependence: Secondary | ICD-10-CM | POA: Insufficient documentation

## 2015-11-29 DIAGNOSIS — R7989 Other specified abnormal findings of blood chemistry: Secondary | ICD-10-CM | POA: Diagnosis not present

## 2015-11-29 DIAGNOSIS — R945 Abnormal results of liver function studies: Secondary | ICD-10-CM

## 2015-11-29 DIAGNOSIS — R072 Precordial pain: Secondary | ICD-10-CM | POA: Diagnosis not present

## 2015-11-29 DIAGNOSIS — R079 Chest pain, unspecified: Secondary | ICD-10-CM | POA: Diagnosis not present

## 2015-11-29 DIAGNOSIS — Z720 Tobacco use: Secondary | ICD-10-CM

## 2015-11-29 NOTE — Assessment & Plan Note (Signed)
Not compliant with C-pap recently

## 2015-11-29 NOTE — Assessment & Plan Note (Signed)
Quit 15 yrs ago

## 2015-11-29 NOTE — Patient Instructions (Addendum)
Procedures  Your physician has requested that you have en exercise stress myoview. For further information please visit [email or web address].  --As soon as possible. He is going out of the country next week.--   Labwork  Your physician recommends that you return for lab work the morning of your test.   Follow-Up  Your physician recommends that you schedule a follow-up appointment in: 6 weeks with Dr. Gwenlyn Found.  If you need a refill on your cardiac medications before your next appointment, please call your pharmacy.

## 2015-11-29 NOTE — Assessment & Plan Note (Signed)
Referred by Dr Shelia Media for chest pain

## 2015-11-29 NOTE — Progress Notes (Signed)
11/29/2015 Luke Smith   1961/02/24  [id number]  Primary Physician Horatio Pel, MD Primary Cardiologist: Dr Gwenlyn Found  HPI:  55 y/o Caucasian male with a history of an abnormal Met test in 2014, though the pt says he feels that test was not accurate secondary to technical problems during the test. Dr Gwenlyn Found felt he might have microvascular ischemia. The pt had no further work up and his LOV with Dr berry was 2014.           He is referred today by Dr Shelia Media for evaluation of chest pain. 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 Several weeks ago he developed a perforated ear drum from flying and was placed on antibiotics. Since he has been flying so frequently he has also developed a flair up chronic back pain and has taken Hydrocodone off and on. # weeks ago he was walking with his wife when he developed SSCP and Lt arm numbness. He didn't say anything then, "just walked through it". A couple of weeks later he was in the [place] airport and developed SSCP again, this time almost immediately after taking a Hydrocodone for back pain. He denies any associated nausea, vomiting, or diaphoresis.     Current Outpatient Prescriptions  Medication Sig Dispense Refill  . aspirin 81 MG EC tablet Take 81 mg by mouth daily.     . Cholecalciferol (VITAMIN D3) 5000 UNITS TABS Take 5,000 Units by mouth daily.    . cyclobenzaprine (FLEXERIL) 10 MG tablet Take 10 mg by mouth 3 (three) times daily as needed for muscle spasms.    . fish oil-omega-3 fatty acids 1000 MG capsule Take 1 g by mouth daily.    Marland Kitchen HYDROcodone-acetaminophen (NORCO/VICODIN) 5-325 MG per tablet Take 1 tablet by mouth at bedtime as needed.    . nitroGLYCERIN (NITROSTAT) 0.4 MG SL tablet Place 0.4 mg under the tongue every 5 (five) minutes as needed for chest pain (x 3 doses).     Marland Kitchen omeprazole (PRILOSEC) 40 MG capsule Take 40 mg  by mouth daily.     Marland Kitchen zolpidem (AMBIEN) 10 MG tablet Take 1 tablet by mouth at bedtime as needed.     No current facility-administered medications for this visit.     Allergies  Allergen Reactions  . Aspirin Other (See Comments)    REACTION: acid reflux   FM Hx-no immediate family history of CAD but he has an uncle that had CAD  Social History   Social History  . Marital status: Married    Spouse name: N/A  . Number of children: N/A  . Years of education: N/A   Occupational History  . Not on file.   Social History Main Topics  . Smoking status: Former Smoker    Packs/day: 0.50    Years: 20.00    Types: Cigarettes    Quit date: 04/14/2001  . Smokeless tobacco: Not on file  . Alcohol use 3.6 oz/week    6 Cans of beer per week  . Drug use: No  . Sexual activity: Not on file   Other Topics Concern  . Not on file   Social History Narrative  . No narrative on file     Review of Systems: General: negative for chills, fever, night sweats or weight changes.  Cardiovascular: negative for chest pain, dyspnea on exertion, edema, orthopnea, palpitations, paroxysmal nocturnal dyspnea  or shortness of breath Dermatological: negative for rash Respiratory: negative for cough or wheezing Urologic: negative for hematuria Abdominal: negative for nausea, vomiting, diarrhea, bright red blood per rectum, melena, or hematemesis Neurologic: negative for visual changes, syncope, or dizziness He admits he has not been using his C-pap lat ley All other systems reviewed and are otherwise negative except as noted above.    Blood pressure 140/88, pulse 79, height _0  (1.753 m), weight 192 lb 6.4 oz (87.3 kg).  General appearance: alert, cooperative and no distress Neck: no carotid bruit and no JVD Lungs: clear to auscultation bilaterally Heart: regular rate and rhythm Abdomen: soft, non-tender; bowel sounds normal; no masses,  no organomegaly Extremities: extremities normal, atraumatic,  no cyanosis or edema Pulses: 2+ and symmetric Skin: Skin color, texture, turgor normal. No rashes or lesions Neurologic: Grossly normal  EKG NSR  ASSESSMENT AND PLAN:   Chest pain with moderate risk of acute coronary syndrome Referred by Dr Shelia Media for chest pain. The episode he had when walking sounds suspicious for angina to me.   Sleep apnea Not compliant with C-pap recently  History of smoking Quit 15 yrs ago   PLAN  Will proceed with with Exercise Myoview. He is on a baby ASA and PPI. I ordered fasting lipids the AM of his test. I did not add a beta blocker since he will have an exercise test in the day or two, will consider adding based on stress test results.   Kerin Ransom PA-C 11/29/2015 9:34 AM

## 2015-11-30 ENCOUNTER — Telehealth (HOSPITAL_COMMUNITY): Payer: Self-pay

## 2015-11-30 NOTE — Telephone Encounter (Signed)
Encounter complete. 

## 2015-12-05 ENCOUNTER — Ambulatory Visit (HOSPITAL_COMMUNITY)
Admission: RE | Admit: 2015-12-05 | Discharge: 2015-12-05 | Disposition: A | Discharge status: Home / Self Care | Payer: BLUE CROSS/BLUE SHIELD | Source: Ambulatory Visit | Attending: Cardiovascular Disease | Admitting: Cardiovascular Disease

## 2015-12-05 DIAGNOSIS — Z87891 Personal history of nicotine dependence: Secondary | ICD-10-CM | POA: Diagnosis not present

## 2015-12-05 DIAGNOSIS — R9439 Abnormal result of other cardiovascular function study: Secondary | ICD-10-CM | POA: Insufficient documentation

## 2015-12-05 DIAGNOSIS — Z8249 Family history of ischemic heart disease and other diseases of the circulatory system: Secondary | ICD-10-CM | POA: Diagnosis not present

## 2015-12-05 DIAGNOSIS — Z6828 Body mass index (BMI) 28.0-28.9, adult: Secondary | ICD-10-CM | POA: Insufficient documentation

## 2015-12-05 DIAGNOSIS — G4733 Obstructive sleep apnea (adult) (pediatric): Secondary | ICD-10-CM | POA: Diagnosis not present

## 2015-12-05 DIAGNOSIS — E663 Overweight: Secondary | ICD-10-CM | POA: Insufficient documentation

## 2015-12-05 DIAGNOSIS — R072 Precordial pain: Secondary | ICD-10-CM | POA: Diagnosis not present

## 2015-12-05 LAB — MYOCARDIAL PERFUSION IMAGING
Estimated workload: 11.4 METS
Exercise duration (min): 10 min
Exercise duration (sec): 16 s
LV dias vol: 75 mL (ref 62–150)
LV sys vol: 33 mL
MPHR: 166 {beats}/min
Peak HR: 157 {beats}/min
Percent HR: 94 %
RPE: 17
Rest HR: 73 {beats}/min
SDS: 4
SRS: 4
SSS: 8
TID: 0.96

## 2015-12-05 MED ORDER — TECHNETIUM TC 99M TETROFOSMIN IV KIT
29.6000 | PACK | Freq: Once | INTRAVENOUS | Status: AC | PRN
  Administered 2015-12-05: 29.6 via INTRAVENOUS
  Filled 2015-12-05: qty 30

## 2015-12-05 MED ORDER — TECHNETIUM TC 99M TETROFOSMIN IV KIT
10.3000 | PACK | Freq: Once | INTRAVENOUS | Status: AC | PRN
  Administered 2015-12-05: 10.3 via INTRAVENOUS
  Filled 2015-12-05: qty 10

## 2016-01-04 LAB — COMPLETE METABOLIC PANEL WITH GFR
ALT: 87 U/L — ABNORMAL HIGH (ref 9–46)
AST: 44 U/L — ABNORMAL HIGH (ref 10–35)
Albumin: 4.6 g/dL (ref 3.6–5.1)
Alkaline Phosphatase: 57 U/L (ref 40–115)
BUN: 14 mg/dL (ref 7–25)
CO2: 26 mmol/L (ref 20–31)
Calcium: 9.3 mg/dL (ref 8.6–10.3)
Chloride: 102 mmol/L (ref 98–110)
Creat: 1.23 mg/dL (ref 0.70–1.33)
GFR, Est African American: 76 mL/min (ref >=60)
GFR, Est Non African American: 66 mL/min (ref >=60)
Glucose, Bld: 114 mg/dL — ABNORMAL HIGH (ref 65–99)
Potassium: 4.4 mmol/L (ref 3.5–5.3)
Sodium: 139 mmol/L (ref 135–146)
Total Bilirubin: 0.7 mg/dL (ref 0.2–1.2)
Total Protein: 7 g/dL (ref 6.1–8.1)

## 2016-01-04 LAB — LIPID PANEL
Cholesterol: 234 mg/dL — ABNORMAL HIGH (ref 125–200)
HDL: 43 mg/dL (ref >=40)
LDL Cholesterol: 139 mg/dL — ABNORMAL HIGH (ref <130)
Total CHOL/HDL Ratio: 5.4 Ratio — ABNORMAL HIGH (ref <=5.0)
Triglycerides: 259 mg/dL — ABNORMAL HIGH (ref <150)
VLDL: 52 mg/dL — ABNORMAL HIGH (ref <30)

## 2016-01-10 ENCOUNTER — Encounter: Payer: Self-pay | Admitting: Gastroenterology

## 2016-01-10 ENCOUNTER — Ambulatory Visit (INDEPENDENT_AMBULATORY_CARE_PROVIDER_SITE_OTHER): Payer: BLUE CROSS/BLUE SHIELD | Admitting: Gastroenterology

## 2016-01-10 VITALS — BP 160/88 | HR 84 | Ht 69.0 in | Wt 189.0 lb

## 2016-01-10 DIAGNOSIS — Z1211 Encounter for screening for malignant neoplasm of colon: Secondary | ICD-10-CM

## 2016-01-10 DIAGNOSIS — Z860100 Personal history of colon polyps, unspecified: Secondary | ICD-10-CM

## 2016-01-10 DIAGNOSIS — R0789 Other chest pain: Secondary | ICD-10-CM | POA: Diagnosis not present

## 2016-01-10 DIAGNOSIS — K219 Gastro-esophageal reflux disease without esophagitis: Secondary | ICD-10-CM | POA: Diagnosis not present

## 2016-01-10 MED ORDER — DEXLANSOPRAZOLE 60 MG PO CPDR: 60.0000 mg | DELAYED_RELEASE_CAPSULE | Freq: Every day | ORAL | 3 refills | Status: DC

## 2016-01-10 MED ORDER — RANITIDINE HCL 300 MG PO TABS: 300.0000 mg | ORAL_TABLET | Freq: Every day | ORAL | 3 refills | Status: DC

## 2016-01-10 NOTE — Patient Instructions (Signed)
You have been scheduled for an endoscopy and colonoscopy. Please follow the written instructions given to you at your visit today. Please pick up your prep supplies at the pharmacy within the next 1-3 days. If you use inhalers (even only as needed), please bring them with you on the day of your procedure. Your physician has requested that you go to www.startemmi.com and enter the access code given to you at your visit today. This web site gives a general overview about your procedure. However, you should still follow specific instructions given to you by our office regarding your preparation for the procedure.   You have been scheduled for an Upper GI Series at 02/01/2016. Your appointment is on 10:30am at [facility] . Please arrive 15 minutes prior to your test for registration. Make sure not to eat or drink anything after midnight on the night before your test. If you need to reschedule, please call radiology at [phone]. ________________________________________________________________ An upper GI series uses x rays to help diagnose problems of the upper GI tract, which includes the esophagus, stomach, and duodenum. The duodenum is the first part of the small intestine. An upper GI series is conducted by a radiology technologist or a radiologist-a doctor who specializes in x-ray imaging-at a hospital or outpatient center. While sitting or standing in front of an x-ray machine, the patient drinks barium liquid, which is often white and has a chalky consistency and taste. The barium liquid coats the lining of the upper GI tract and makes signs of disease show up more clearly on x rays. X-ray video, called fluoroscopy, is used to view the barium liquid moving through the esophagus, stomach, and duodenum. Additional x rays and fluoroscopy are performed while the patient lies on an x-ray table. To fully coat the upper GI tract with barium liquid, the technologist or radiologist may press on  the abdomen or ask the patient to change position. Patients hold still in various positions, allowing the technologist or radiologist to take x rays of the upper GI tract at different angles. If a technologist conducts the upper GI series, a radiologist will later examine the images to look for problems.  This test typically takes about 1 hour to complete.     We will send Dexilant to your pharmacy  We will send Zantac to your pharmacy   Gastroesophageal Reflux Disease, Adult Normally, food travels down the esophagus and stays in the stomach to be digested. However, when a person has gastroesophageal reflux disease (GERD), food and stomach acid move back up into the esophagus. When this happens, the esophagus becomes sore and inflamed. Over time, GERD can create small holes (ulcers) in the lining of the esophagus.  CAUSES This condition is caused by a problem with the muscle between the esophagus and the stomach (lower esophageal sphincter, or LES). Normally, the LES muscle closes after food passes through the esophagus to the stomach. When the LES is weakened or abnormal, it does not close properly, and that allows food and stomach acid to go back up into the esophagus. The LES can be weakened by certain dietary substances, medicines, and medical conditions, including:  Tobacco use.  Pregnancy.  Having a hiatal hernia.  Heavy alcohol use.  Certain foods and beverages, such as coffee, chocolate, onions, and peppermint. RISK FACTORS This condition is more likely to develop in:  People who have an increased body weight.  People who have connective tissue disorders.  People who use NSAID medicines. SYMPTOMS Symptoms of  this condition include:  Heartburn.  Difficult or painful swallowing.  The feeling of having a lump in the throat.  Abitter taste in the mouth.  Bad breath.  Having a large amount of saliva.  Having an upset or bloated stomach.  Belching.  Chest  pain.  Shortness of breath or wheezing.  Ongoing (chronic) cough or a night-time cough.  Wearing away of tooth enamel.  Weight loss. Different conditions can cause chest pain. Make sure to see your health care provider if you experience chest pain. DIAGNOSIS Your health care provider will take a medical history and perform a physical exam. To determine if you have mild or severe GERD, your health care provider may also monitor how you respond to treatment. You may also have other tests, including:  An endoscopy toexamine your stomach and esophagus with a small camera.  A test thatmeasures the acidity level in your esophagus.  A test thatmeasures how much pressure is on your esophagus.  A barium swallow or modified barium swallow to show the shape, size, and functioning of your esophagus. TREATMENT The goal of treatment is to help relieve your symptoms and to prevent complications. Treatment for this condition may vary depending on how severe your symptoms are. Your health care provider may recommend:  Changes to your diet.  Medicine.  Surgery. HOME CARE INSTRUCTIONS Diet  Follow a diet as recommended by your health care provider. This may involve avoiding foods and drinks such as:  Coffee and tea (with or without caffeine).  Drinks that containalcohol.  Energy drinks and sports drinks.  Carbonated drinks or sodas.  Chocolate and cocoa.  Peppermint and mint flavorings.  Garlic and onions.  Horseradish.  Spicy and acidic foods, including peppers, chili powder, curry powder, vinegar, hot sauces, and barbecue sauce.  Citrus fruit juices and citrus fruits, such as oranges, lemons, and limes.  Tomato-based foods, such as red sauce, chili, salsa, and pizza with red sauce.  Fried and fatty foods, such as donuts, french fries, potato chips, and high-fat dressings.  High-fat meats, such as hot dogs and fatty cuts of red and white meats, such as rib eye steak,  sausage, ham, and bacon.  High-fat dairy items, such as whole milk, butter, and cream cheese.  Eat small, frequent meals instead of large meals.  Avoid drinking large amounts of liquid with your meals.  Avoid eating meals during the 2-3 hours before bedtime.  Avoid lying down right after you eat.  Do not exercise right after you eat. General Instructions  Pay attention to any changes in your symptoms.  Take over-the-counter and prescription medicines only as told by your health care provider. Do not take aspirin, ibuprofen, or other NSAIDs unless your health care provider told you to do so.  Do not use any tobacco products, including cigarettes, chewing tobacco, and e-cigarettes. If you need help quitting, ask your health care provider.  Wear loose-fitting clothing. Do not wear anything tight around your waist that causes pressure on your abdomen.  Raise (elevate) the head of your bed 6 inches (15cm).  Try to reduce your stress, such as with yoga or meditation. If you need help reducing stress, ask your health care provider.  If you are overweight, reduce your weight to an amount that is healthy for you. Ask your health care provider for guidance about a safe weight loss goal.  Keep all follow-up visits as told by your health care provider. This is important. SEEK MEDICAL CARE IF:  You have  new symptoms.  You have unexplained weight loss.  You have difficulty swallowing, or it hurts to swallow.  You have wheezing or a persistent cough.  Your symptoms do not improve with treatment.  You have a hoarse voice. SEEK IMMEDIATE MEDICAL CARE IF:  You have pain in your arms, neck, jaw, teeth, or back.  You feel sweaty, dizzy, or light-headed.  You have chest pain or shortness of breath.  You vomit and your vomit looks like blood or coffee grounds.  You faint.  Your stool is bloody or black.  You cannot swallow, drink, or eat.   This information is not intended to  replace advice given to you by your health care provider. Make sure you discuss any questions you have with your health care provider.   Document Released: 01/08/2005 Document Revised: 12/20/2014 Document Reviewed: 07/26/2014 Elsevier Interactive Patient Education [employer].      __________________________________________________________________

## 2016-01-10 NOTE — Progress Notes (Signed)
JARICK HARKINS    [id number]    08/28/1960  Primary Care Physician:PHARR,WALTER DAVIDSON, MD  Referring Physician: Deland Pretty, MD [address] [place], [place] [address]  Chief complaint:  GERD, atypical chest pain, surveillance colonsocopy HPI: 59 yr M followed by Dr Deatra Ina in the past here with c/o severe heartburn and had an episode of chest pain, that was relieved after nitroglycerine was similar to the episodes he has had in the past few months, underwent cardiac stress test that was negative for any ischemic heart disease. He also has regurgitation, intermittent cough and sore throat associated with acid taste in the mouth. PPI dose was increased to twice daily with no significant improvement. Denies any dysphagia or odynophagia. Denies any nausea, vomiting, abdominal pain, melena or bright red blood per rectum. Last colonoscopy in 2011 with removal of polyps is due for surveillance colonoscopy.    Outpatient Encounter Prescriptions as of 01/10/2016  Medication Sig  . aspirin 81 MG EC tablet Take 81 mg by mouth daily.   . Cholecalciferol (VITAMIN D3) 5000 UNITS TABS Take 5,000 Units by mouth daily.  . cyclobenzaprine (FLEXERIL) 10 MG tablet Take 10 mg by mouth 3 (three) times daily as needed for muscle spasms.  . fish oil-omega-3 fatty acids 1000 MG capsule Take 1 g by mouth daily.  Marland Kitchen HYDROcodone-acetaminophen (NORCO/VICODIN) 5-325 MG per tablet Take 1 tablet by mouth at bedtime as needed.  . nitroGLYCERIN (NITROSTAT) 0.4 MG SL tablet Place 0.4 mg under the tongue every 5 (five) minutes as needed for chest pain (x 3 doses).   . pantoprazole (PROTONIX) 40 MG tablet Take 40 mg by mouth 2 (two) times daily.  Marland Kitchen zolpidem (AMBIEN) 10 MG tablet Take 1 tablet by mouth at bedtime as needed.  . [DISCONTINUED] omeprazole (PRILOSEC) 40 MG capsule Take 40 mg by mouth daily.    No facility-administered encounter medications on file as of 01/10/2016.     Allergies as  of 01/10/2016 - Review Complete 01/10/2016  Allergen Reaction Noted  . Aspirin Other (See Comments)     Past Medical History:  Diagnosis Date  . GERD (gastroesophageal reflux disease)   . Perforated ear drum    left ear  . Shortness of breath 05/24/2012   Abnormal MET TEST  . Sleep apnea     Past Surgical History:  Procedure Laterality Date  . NASAL SEPTUM SURGERY      Family History  Problem Relation Age of Onset  . Bone cancer Father   . Colon cancer Neg Hx     Social History   Social History  . Marital status: Married    Spouse name: N/A  . Number of children: 2  . Years of education: N/A   Occupational History  . engineer    Social History Main Topics  . Smoking status: Former Smoker    Packs/day: 0.50    Years: 20.00    Types: Cigarettes    Quit date: 04/14/2001  . Smokeless tobacco: Never Used  . Alcohol use 3.6 oz/week    6 Cans of beer per week     Comment: weekends  . Drug use: No  . Sexual activity: Not on file   Other Topics Concern  . Not on file   Social History Narrative  . No narrative on file      Review of systems: Review of Systems  Constitutional: Negative for fever and chills.  HENT: Negative.  Eyes: Negative for blurred vision.  Respiratory:  Positive for cough, Negative for shortness of breath and wheezing.   Cardiovascular: Negative for chest pain and palpitations.  Gastrointestinal: as per HPI Genitourinary: Negative for dysuria, urgency, frequency and hematuria.  Musculoskeletal: Negative for myalgias, back pain and joint pain.  Skin: Negative for itching and rash.  Neurological: Negative for dizziness, tremors, focal weakness, seizures and loss of consciousness.  Endo/Heme/Allergies: Positive for seasonal allergies.  Psychiatric/Behavioral: Negative for depression, suicidal ideas and hallucinations.  All other systems reviewed and are negative.   Physical Exam: Vitals:   01/10/16 0934  BP: (!) 160/88  Pulse: 84    Body mass index is 27.91 kg/m. Gen:      No acute distress HEENT:  EOMI, sclera anicteric Neck:     No masses; no thyromegaly Lungs:    Clear to auscultation bilaterally; normal respiratory effort CV:         Regular rate and rhythm; no murmurs Abd:      + bowel sounds; soft, non-tender; no palpable masses, no distension Ext:    No edema; adequate peripheral perfusion Skin:      Warm and dry; no rash Neuro: alert and oriented x 3 Psych: normal mood and affect  Data Reviewed:  Reviewed labs, radiology imaging, old records and pertinent past GI work up   Assessment and Plan/Recommendations:  4 yr M with h/o GERD here with worsening heartburn and intermittent atypical chest pain Will switch to Dexilant 75m daily for better acid suppression and add Zantac at bedtime for nocturnal acid suppression Schedule for EGD for evaluation and r/o severe esophagitis/candida He is also past due for surveillance colonoscopy, schedule it at the same time The risks and benefits as well as alternatives of endoscopic procedure(s) have been discussed and reviewed. All questions answered. The patient agrees to proceed.  25 minutes was spent face-to-face with the patient. Greater than 50% of the time used for counseling as well as treatment plan and follow-up. She had multiple questions which were answered to her satisfaction  K. VDenzil Magnuson, MD [phone]-Fri 8a-5p [phone] 5p, weekends, holidays  CC: PDeland Pretty MD

## 2016-01-11 ENCOUNTER — Encounter: Payer: Self-pay | Admitting: Gastroenterology

## 2016-01-11 ENCOUNTER — Ambulatory Visit (AMBULATORY_SURGERY_CENTER): Payer: BLUE CROSS/BLUE SHIELD | Admitting: Gastroenterology

## 2016-01-11 VITALS — BP 135/93 | HR 75 | Temp 97.7°F | Resp 17 | Ht 69.0 in | Wt 189.0 lb

## 2016-01-11 DIAGNOSIS — Z1211 Encounter for screening for malignant neoplasm of colon: Secondary | ICD-10-CM | POA: Diagnosis not present

## 2016-01-11 DIAGNOSIS — D122 Benign neoplasm of ascending colon: Secondary | ICD-10-CM

## 2016-01-11 DIAGNOSIS — D124 Benign neoplasm of descending colon: Secondary | ICD-10-CM | POA: Diagnosis not present

## 2016-01-11 DIAGNOSIS — K219 Gastro-esophageal reflux disease without esophagitis: Secondary | ICD-10-CM | POA: Diagnosis not present

## 2016-01-11 MED ORDER — SODIUM CHLORIDE 0.9 % IV SOLN: 500.0000 mL | INTRAVENOUS | Status: DC

## 2016-01-11 MED ORDER — RANITIDINE HCL 300 MG PO TABS: 300.0000 mg | ORAL_TABLET | Freq: Every day | ORAL | 3 refills | Status: DC

## 2016-01-11 NOTE — Progress Notes (Signed)
Report given to PACU RN, vss 

## 2016-01-11 NOTE — Patient Instructions (Signed)
YOU HAD AN ENDOSCOPIC PROCEDURE TODAY AT [facility]:   Refer to the procedure report that was given to you for any specific questions about what was found during the examination.  If the procedure report does not answer your questions, please call your gastroenterologist to clarify.  If you requested that your care partner not be given the details of your procedure findings, then the procedure report has been included in a sealed envelope for you to review at your convenience later.  YOU SHOULD EXPECT: Some feelings of bloating in the abdomen. Passage of more gas than usual.  Walking can help get rid of the air that was put into your GI tract during the procedure and reduce the bloating. If you had a lower endoscopy (such as a colonoscopy or flexible sigmoidoscopy) you may notice spotting of blood in your stool or on the toilet paper. If you underwent a bowel prep for your procedure, you may not have a normal bowel movement for a few days.  Please Note:  You might notice some irritation and congestion in your nose or some drainage.  This is from the oxygen used during your procedure.  There is no need for concern and it should clear up in a day or so.  SYMPTOMS TO REPORT IMMEDIATELY:   Following lower endoscopy (colonoscopy or flexible sigmoidoscopy):  Excessive amounts of blood in the stool  Significant tenderness or worsening of abdominal pains  Swelling of the abdomen that is new, acute  Fever of 100F or higher   Following upper endoscopy (EGD)  Vomiting of blood or coffee ground material  New chest pain or pain under the shoulder blades  Painful or persistently difficult swallowing  New shortness of breath  Fever of 100F or higher  Black, tarry-looking stools  For urgent or emergent issues, a gastroenterologist can be reached at any hour by calling [phone].   DIET:  We do recommend a small meal at first, but then you may proceed to your regular diet.  Drink  plenty of fluids but you should avoid alcoholic beverages for 24 hours.  ACTIVITY:  You should plan to take it easy for the rest of today and you should NOT DRIVE or use heavy machinery until tomorrow (because of the sedation medicines used during the test).    FOLLOW UP: Our staff will call the number listed on your records the next business day following your procedure to check on you and address any questions or concerns that you may have regarding the information given to you following your procedure. If we do not reach you, we will leave a message.  However, if you are feeling well and you are not experiencing any problems, there is no need to return our call.  We will assume that you have returned to your regular daily activities without incident.  If any biopsies were taken you will be contacted by phone or by letter within the next 1-3 weeks.  Please call us at [phone] if you have not heard about the biopsies in 3 weeks.    SIGNATURES/CONFIDENTIALITY: You and/or your care partner have signed paperwork which will be entered into your electronic medical record.  These signatures attest to the fact that that the information above on your After Visit Summary has been reviewed and is understood.  Full responsibility of the confidentiality of this discharge information lies with you and/or your care-partner.  Polyp, diverticulosis, high fiber diet  And hemorrhoid information given.  Await biopsy results from upper and lower endoscopies.  No ibuprofen, naproxen, or other nonsteroidal anti inflammatory meds.  Follow up with Dr. Shelia Media for evaluation of hypertension.

## 2016-01-11 NOTE — Op Note (Addendum)
[facility] Patient Name: Luke Smith Procedure Date: 01/11/2016 10:09 AM MRN: [id number] Endoscopist: Mauri Pole , MD Age: 55 Referring MD:  Date of Birth: Feb 28, 1961 Gender: Male Account #: [id number] Procedure:                Colonoscopy Indications:              Surveillance: Personal history of adenomatous                            polyps on last colonoscopy > 5 years ago, High risk                            colon cancer surveillance: Personal history of                            adenoma less than 10 mm in size Medicines:                Monitored Anesthesia Care Procedure:                Pre-Anesthesia Assessment:                           - Prior to the procedure, a History and Physical                            was performed, and patient medications and                            allergies were reviewed. The patient's tolerance of                            previous anesthesia was also reviewed. The risks                            and benefits of the procedure and the sedation                            options and risks were discussed with the patient.                            All questions were answered, and informed consent                            was obtained. Prior Anticoagulants: The patient has                            taken no previous anticoagulant or antiplatelet                            agents. ASA Grade Assessment: II - A patient with                            mild systemic disease. After reviewing the risks  and benefits, the patient was deemed in                            satisfactory condition to undergo the procedure.                           After obtaining informed consent, the colonoscope                            was passed under direct vision. Throughout the                            procedure, the patient's blood pressure, pulse, and                            oxygen saturations were monitored  continuously. The                            Model CF-HQ190L [id number]) scope was introduced                            through the anus and advanced to the the terminal                            ileum, with identification of the appendiceal                            orifice and IC valve. The colonoscopy was performed                            without difficulty. The patient tolerated the                            procedure well. The quality of the bowel                            preparation was adequate. The ileocecal valve,                            appendiceal orifice, and rectum were photographed. Scope In: 10:23:52 AM Scope Out: 10:37:12 AM Scope Withdrawal Time: 0 hours 8 minutes 12 seconds  Total Procedure Duration: 0 hours 13 minutes 20 seconds  Findings:                 The perianal and digital rectal examinations were                            normal.                           A 9 mm polyp was found in the descending colon. The                            polyp was sessile. The polyp was removed with a  cold snare. Resection and retrieval were complete.                           A 3 mm polyp was found in the ascending colon. The                            polyp was sessile. The polyp was removed with a                            cold biopsy forceps. Resection and retrieval were                            complete.                           Scattered small and large-mouthed diverticula were                            found in the sigmoid colon, descending colon,                            transverse colon and ascending colon. There was                            evidence of diverticular spasm. There was evidence                            of an impacted diverticulum. There was no evidence                            of diverticular bleeding.                           Non-bleeding internal hemorrhoids were found during                             retroflexion. The hemorrhoids were small.                           The exam was otherwise without abnormality.                           The terminal ileum contained a single non-bleeding                            erosion. Complications:            No immediate complications. Estimated Blood Loss:     Estimated blood loss was minimal. Impression:               - One 9 mm polyp in the descending colon, removed                            with a cold snare. Resected and retrieved.                           -  One 3 mm polyp in the ascending colon, removed                            with a cold biopsy forceps. Resected and retrieved.                           - Moderate diverticulosis in the sigmoid colon, in                            the descending colon, in the transverse colon and                            in the ascending colon. There was evidence of                            diverticular spasm. There was evidence of an                            impacted diverticulum. There was no evidence of                            diverticular bleeding.                           - Non-bleeding internal hemorrhoids.                           - The examination was otherwise normal.                           - A single erosion in the terminal ileum likely                            secondary to NSAID use. Recommendation:           - Patient has a contact number available for                            emergencies. The signs and symptoms of potential                            delayed complications were discussed with the                            patient. Return to normal activities tomorrow.                            Written discharge instructions were provided to the                            patient.                           - Resume previous diet.                           -  Continue present medications.                           - Await pathology results.                           -  Repeat colonoscopy in 3 - 5 years for                            surveillance based on pathology results.                           - Return to GI clinic PRN.                           - No ibuprofen, naproxen, or other non-steroidal                            anti-inflammatory drugs.                           -Follow up with PMD for evaluation and management                            of Hypertension (Noted to have elevated BP systolic                            [id number] and diastolic [id number] throughout the procedure                            with good adequate sedation with propofol) Mauri Pole, MD 01/11/2016 10:48:19 AM This report has been signed electronically.

## 2016-01-11 NOTE — Progress Notes (Signed)
Called to room to assist during endoscopic procedure.  Patient ID and intended procedure confirmed with present staff. Received instructions for my participation in the procedure from the performing physician.  

## 2016-01-11 NOTE — Op Note (Signed)
[facility] Patient Name: Luke Smith Procedure Date: 01/11/2016 10:10 AM MRN: [id number] Endoscopist: Mauri Pole , MD Age: 55 Referring MD:  Date of Birth: [birth year] Gender: Male Account #: [id number] Procedure:                Upper GI endoscopy Indications:              Esophageal reflux symptoms that persist despite                            appropriate therapy, Esophageal reflux symptoms                            that recur despite appropriate therapy, Upper                            abdominal symptoms that persist despite an                            appropriate trial of therapy Medicines:                Monitored Anesthesia Care Procedure:                Pre-Anesthesia Assessment:                           - Prior to the procedure, a History and Physical                            was performed, and patient medications and                            allergies were reviewed. The patient's tolerance of                            previous anesthesia was also reviewed. The risks                            and benefits of the procedure and the sedation                            options and risks were discussed with the patient.                            All questions were answered, and informed consent                            was obtained. Prior Anticoagulants: The patient has                            taken no previous anticoagulant or antiplatelet                            agents. ASA Grade Assessment: II - A patient with  mild systemic disease. After reviewing the risks                            and benefits, the patient was deemed in                            satisfactory condition to undergo the procedure.                           After obtaining informed consent, the endoscope was                            passed under direct vision. Throughout the                            procedure, the patient's blood pressure,  pulse, and                            oxygen saturations were monitored continuously. The                            Model GIF-[id number]) scope was introduced                            through the mouth, and advanced to the second part                            of duodenum. The upper GI endoscopy was                            accomplished without difficulty. The patient                            tolerated the procedure well. Scope In: Scope Out: Findings:                 There were esophageal mucosal changes suspicious                            for short-segment Barrett's esophagus present in                            the lower third of the esophagus 38cm from                            incissors with irregular Z-line. The maximum                            longitudinal extent of these mucosal changes was <3                            cm in length. Mucosa was biopsied with a cold  forceps for histology. One specimen bottle was sent                            to pathology.                           The stomach was normal.                           The examined duodenum was normal. Complications:            No immediate complications. Estimated Blood Loss:     Estimated blood loss was minimal. Impression:               - Esophageal mucosal changes suspicious for                            short-segment Barrett's esophagus. Biopsied.                           - Normal stomach.                           - Normal examined duodenum. Recommendation:           - Patient has a contact number available for                            emergencies. The signs and symptoms of potential                            delayed complications were discussed with the                            patient. Return to normal activities tomorrow.                            Written discharge instructions were provided to the                            patient.                            - Resume previous diet.                           - Continue present medications.                           - Await pathology results.                           - No ibuprofen, naproxen, or other non-steroidal                            anti-inflammatory drugs.                           - Repeat upper endoscopy  after studies are complete                            for surveillance based on pathology results.                           - Return to GI clinic PRN. Mauri Pole, MD 01/11/2016 10:41:39 AM This report has been signed electronically.

## 2016-01-14 ENCOUNTER — Other Ambulatory Visit: Payer: Self-pay

## 2016-01-14 ENCOUNTER — Telehealth: Payer: Self-pay | Admitting: Gastroenterology

## 2016-01-14 ENCOUNTER — Telehealth: Payer: Self-pay

## 2016-01-14 MED ORDER — LIDOCAINE VISCOUS 2 % MT SOLN: OROMUCOSAL | 0 refills | Status: DC

## 2016-01-14 NOTE — Telephone Encounter (Signed)
Pt is calling back to see what he needs to do

## 2016-01-14 NOTE — Telephone Encounter (Signed)
  Follow up Call-  Call back number 01/11/2016  Post procedure Call Back phone  # [phone]  Permission to leave phone message Yes  Some recent data might be hidden     Patient questions:  Do you have a fever, pain , or abdominal swelling? No. Pain Score  0 *  Have you tolerated food without any problems? Yes.    Have you been able to return to your normal activities? Yes.    Do you have any questions about your discharge instructions: Diet   No. Medications  No. Follow up visit  No.  Do you have questions or concerns about your Care? No.  Actions: * If pain score is 4 or above: No action needed, pain <4.

## 2016-01-14 NOTE — Telephone Encounter (Signed)
He just had biopsies at EG junction to exclude Barrett's disease, did not have dilation or proximal esophageal biopsies. Continue PPI daily and H2 blocker at bedtime. He can take Lidocaine 2% viscous 5-10 cc as needed q4-6 hours. No eating or drinking after viscous lidocaine for 1-2 hours to prevent aspiration. Thanks

## 2016-01-14 NOTE — Telephone Encounter (Signed)
Advised the patient. Discussed soft foods, putting pills in pudding or yogurt to help swallow and liquids.

## 2016-01-14 NOTE — Telephone Encounter (Signed)
Spoke with Mr Ehly. He has a sharp sensation with swallowing. Definitely worse with solids or hot beverages. Somewhat more painful than before the procedure. Pills are extremely painful and feel like they choke him. He is taking Dexilant am and Zantac pm. Stayed home today. Not able to sleep due to his pain waking him up.

## 2016-01-14 NOTE — Telephone Encounter (Signed)
  Follow up Call-  Call back number 01/11/2016  Post procedure Call Back phone  # [phone]  Permission to leave phone message Yes  Some recent data might be hidden    Patient was called for follow up after his procedure on 01/11/2016. No answer at the number given for follow up phone call. A message was left on the answering machine.

## 2016-01-15 ENCOUNTER — Encounter (HOSPITAL_COMMUNITY): Payer: Self-pay | Admitting: Emergency Medicine

## 2016-01-15 ENCOUNTER — Encounter: Payer: Self-pay | Admitting: Cardiovascular Disease

## 2016-01-15 ENCOUNTER — Inpatient Hospital Stay (HOSPITAL_COMMUNITY)
Admission: EM | Admit: 2016-01-15 | Discharge: 2016-01-22 | DRG: 234 | Disposition: A | Discharge status: Home / Self Care | Payer: BLUE CROSS/BLUE SHIELD | Attending: Surgery | Admitting: Surgery

## 2016-01-15 ENCOUNTER — Emergency Department (HOSPITAL_COMMUNITY): Payer: BLUE CROSS/BLUE SHIELD

## 2016-01-15 ENCOUNTER — Other Ambulatory Visit: Payer: Self-pay | Admitting: *Deleted

## 2016-01-15 ENCOUNTER — Ambulatory Visit (INDEPENDENT_AMBULATORY_CARE_PROVIDER_SITE_OTHER): Payer: BLUE CROSS/BLUE SHIELD | Admitting: Cardiovascular Disease

## 2016-01-15 VITALS — BP 169/100 | HR 85 | Ht 69.0 in | Wt 190.8 lb

## 2016-01-15 DIAGNOSIS — I10 Essential (primary) hypertension: Secondary | ICD-10-CM | POA: Diagnosis present

## 2016-01-15 DIAGNOSIS — R079 Chest pain, unspecified: Secondary | ICD-10-CM

## 2016-01-15 DIAGNOSIS — Z9229 Personal history of other drug therapy: Secondary | ICD-10-CM

## 2016-01-15 DIAGNOSIS — Z79899 Other long term (current) drug therapy: Secondary | ICD-10-CM

## 2016-01-15 DIAGNOSIS — Z7901 Long term (current) use of anticoagulants: Secondary | ICD-10-CM

## 2016-01-15 DIAGNOSIS — I2510 Atherosclerotic heart disease of native coronary artery without angina pectoris: Secondary | ICD-10-CM | POA: Diagnosis present

## 2016-01-15 DIAGNOSIS — Z09 Encounter for follow-up examination after completed treatment for conditions other than malignant neoplasm: Secondary | ICD-10-CM

## 2016-01-15 DIAGNOSIS — D62 Acute posthemorrhagic anemia: Secondary | ICD-10-CM | POA: Diagnosis not present

## 2016-01-15 DIAGNOSIS — Z886 Allergy status to analgesic agent status: Secondary | ICD-10-CM

## 2016-01-15 DIAGNOSIS — Z7982 Long term (current) use of aspirin: Secondary | ICD-10-CM

## 2016-01-15 DIAGNOSIS — E8770 Fluid overload, unspecified: Secondary | ICD-10-CM | POA: Diagnosis present

## 2016-01-15 DIAGNOSIS — G4730 Sleep apnea, unspecified: Secondary | ICD-10-CM | POA: Diagnosis present

## 2016-01-15 DIAGNOSIS — R9439 Abnormal result of other cardiovascular function study: Secondary | ICD-10-CM | POA: Diagnosis present

## 2016-01-15 DIAGNOSIS — K5900 Constipation, unspecified: Secondary | ICD-10-CM | POA: Diagnosis not present

## 2016-01-15 DIAGNOSIS — Z01818 Encounter for other preprocedural examination: Secondary | ICD-10-CM

## 2016-01-15 DIAGNOSIS — E119 Type 2 diabetes mellitus without complications: Secondary | ICD-10-CM | POA: Diagnosis present

## 2016-01-15 DIAGNOSIS — Z87891 Personal history of nicotine dependence: Secondary | ICD-10-CM

## 2016-01-15 DIAGNOSIS — I214 Non-ST elevation (NSTEMI) myocardial infarction: Secondary | ICD-10-CM

## 2016-01-15 DIAGNOSIS — Z951 Presence of aortocoronary bypass graft: Secondary | ICD-10-CM | POA: Diagnosis present

## 2016-01-15 DIAGNOSIS — I252 Old myocardial infarction: Secondary | ICD-10-CM | POA: Diagnosis present

## 2016-01-15 DIAGNOSIS — J982 Interstitial emphysema: Secondary | ICD-10-CM | POA: Diagnosis present

## 2016-01-15 DIAGNOSIS — K219 Gastro-esophageal reflux disease without esophagitis: Secondary | ICD-10-CM | POA: Diagnosis present

## 2016-01-15 HISTORY — DX: Essential (primary) hypertension: I10

## 2016-01-15 LAB — BASIC METABOLIC PANEL
ANION GAP: 10 (ref 5–15)
BUN: 15 mg/dL (ref 6–20)
CHLORIDE: 102 mmol/L (ref 101–111)
CO2: 22 mmol/L (ref 22–32)
Calcium: 9.8 mg/dL (ref 8.9–10.3)
Creatinine, Ser: 0.98 mg/dL (ref 0.61–1.24)
GFR calc non Af Amer: >60 mL/min (ref >60)
Glucose, Bld: 169 mg/dL — ABNORMAL HIGH (ref 65–99)
POTASSIUM: 3.8 mmol/L (ref 3.5–5.1)
Sodium: 134 mmol/L — ABNORMAL LOW (ref 135–145)

## 2016-01-15 LAB — I-STAT TROPONIN, ED: TROPONIN I, POC: 1.92 ng/mL — AB (ref 0.00–0.08)

## 2016-01-15 LAB — CBC
HEMATOCRIT: 45.7 % (ref 39.0–52.0)
HEMOGLOBIN: 15.5 g/dL (ref 13.0–17.0)
MCH: 30.5 pg (ref 26.0–34.0)
MCHC: 33.9 g/dL (ref 30.0–36.0)
MCV: 89.9 fL (ref 78.0–100.0)
Platelets: 242 10*3/uL (ref 150–400)
RBC: 5.09 MIL/uL (ref 4.22–5.81)
RDW: 12.4 % (ref 11.5–15.5)
WBC: 14.7 10*3/uL — ABNORMAL HIGH (ref 4.0–10.5)

## 2016-01-15 LAB — HEPATIC FUNCTION PANEL
ALT: 68 U/L — ABNORMAL HIGH (ref 17–63)
AST: 51 U/L — ABNORMAL HIGH (ref 15–41)
Albumin: 4.2 g/dL (ref 3.5–5.0)
Alkaline Phosphatase: 62 U/L (ref 38–126)
BILIRUBIN DIRECT: 0.2 mg/dL (ref 0.1–0.5)
BILIRUBIN TOTAL: 0.6 mg/dL (ref 0.3–1.2)
Indirect Bilirubin: 0.4 mg/dL (ref 0.3–0.9)
Total Protein: 7 g/dL (ref 6.5–8.1)

## 2016-01-15 LAB — LIPASE, BLOOD: LIPASE: 34 U/L (ref 11–51)

## 2016-01-15 LAB — D-DIMER, QUANTITATIVE (NOT AT ARMC): D DIMER QUANT: 0.28 ug{FEU}/mL (ref 0.00–0.50)

## 2016-01-15 MED ORDER — METOPROLOL TARTRATE 5 MG/5ML IV SOLN
5.0000 mg | Freq: Once | INTRAVENOUS | Status: AC
  Administered 2016-01-15: 5 mg via INTRAVENOUS
  Filled 2016-01-15: qty 5

## 2016-01-15 MED ORDER — HYDROMORPHONE HCL 1 MG/ML IJ SOLN
1.0000 mg | Freq: Once | INTRAMUSCULAR | Status: AC
  Administered 2016-01-15: 1 mg via INTRAVENOUS
  Filled 2016-01-15: qty 1

## 2016-01-15 MED ORDER — LORAZEPAM 2 MG/ML IJ SOLN
0.5000 mg | Freq: Once | INTRAMUSCULAR | Status: AC
  Administered 2016-01-15: 0.5 mg via INTRAVENOUS
  Filled 2016-01-15: qty 1

## 2016-01-15 MED ORDER — HEPARIN (PORCINE) IN NACL 100-0.45 UNIT/ML-% IJ SOLN
1050.0000 [IU]/h | INTRAMUSCULAR | Status: DC
  Administered 2016-01-15: 1050 [IU]/h via INTRAVENOUS
  Filled 2016-01-15: qty 250

## 2016-01-15 MED ORDER — NITROGLYCERIN 2 % TD OINT
1.0000 [in_us] | TOPICAL_OINTMENT | Freq: Once | TRANSDERMAL | Status: AC
  Administered 2016-01-15: 1 [in_us] via TOPICAL
  Filled 2016-01-15: qty 1

## 2016-01-15 MED ORDER — HEPARIN BOLUS VIA INFUSION
4000.0000 [IU] | Freq: Once | INTRAVENOUS | Status: AC
  Administered 2016-01-15: 4000 [IU] via INTRAVENOUS
  Filled 2016-01-15: qty 4000

## 2016-01-15 NOTE — Assessment & Plan Note (Signed)
Mr. Abila has had a month of fairly debilitating chest pain. There are some atypical features were consistent with reflux although the pain has caused diaphoresis, upper extremity radiation, shortness of breath and has been nitrate responsive. A recent Myoview stress test was low risk with diaphragmatic attenuation. Given the severe nature of the pain I feel compelled to perform coronary angiography to rule out with complete certainty at ischemic etiology. We'll plan on performing radial heart cath this coming Thursday.The patient understands that risks included but are not limited to stroke (1 in 1000), death (1 in 67), kidney failure [usually temporary] (1 in 500), bleeding (1 in 200), allergic reaction [possibly serious] (1 in 200). The patient understands and agrees to proceed

## 2016-01-15 NOTE — H&P (Signed)
History and Physical   Admit date: 01/15/2016 Name:  Luke Smith Medical record number: [id number] DOB/Age:  05/24/1960  55 y.o. male  Referring Physician:   [facility]  Primary Cardiologist:  Dr. Quay Burow  Primary Physician:   Dr. Deland Pretty  Chief complaint/reason for admission: Chest pain  HPI:  This very nice 55 year old male has a history of sleep apnea and borderline hypertension.  He has had chest discomfort the past month or so and has been evaluated with a negative myocardial perfusion scan as well as endoscopy.  He is continued to have chest discomfort described as pain in his throat along with some radiation in his chest.  He saw Dr. Gwenlyn Found earlier today and arrangements are made have a catheterization on Thursday.  After leaving the office he had worsening chest discomfort and the discomfort became severe tonight associated with sweating and some mild diaphoresis the discomfort was in his chest also radiated to his left side and he presented to the emergency room.  Initial EKG did not show any acute abnormality on the head ST flattening laterally.  His troponin point-of-care returned elevated 1.92.  He was treated with nitroglycerin as well as narcotics and his pain has improved now and he is comfortable.  He was somewhat hypertensive on arrival here.  He is currently pain-free.  He normally denies PND, orthopnea or edema.    Past Medical History:  Diagnosis Date  . GERD (gastroesophageal reflux disease)   . Hypertension   . Perforated ear drum    left ear  . Sleep apnea      Past Surgical History:  Procedure Laterality Date  . DENTAL SURGERY  childhood  . NASAL SEPTUM SURGERY     Allergies: is allergic to aspirin.   Medications: Prior to Admission medications   Medication Sig Start Date End Date Taking? Authorizing Provider  aspirin 81 MG EC tablet Take 81 mg by mouth daily.  11/26/15  Yes Historical Provider, MD  dexlansoprazole (DEXILANT) 60 MG  capsule Take 1 capsule (60 mg total) by mouth daily. 01/10/16  Yes Mauri Pole, MD  lidocaine (XYLOCAINE) 2 % solution Lidocaine Slurry- 20 mls every 4 to 6 hours as needed for throat pain. Do not eat or drink for 2 hours after using to prevent choking 01/14/16  Yes Mauri Pole, MD  nitroGLYCERIN (NITROSTAT) 0.4 MG SL tablet Place 0.4 mg under the tongue every 5 (five) minutes as needed for chest pain (x 3 doses).  11/26/15  Yes Historical Provider, MD  ranitidine (ZANTAC) 300 MG tablet Take 1 tablet (300 mg total) by mouth at bedtime. 01/11/16  Yes Mauri Pole, MD  zolpidem (AMBIEN) 10 MG tablet Take 1 tablet by mouth at bedtime as needed for sleep.  07/02/12  Yes Historical Provider, MD   Family History:  Family Status  Relation Status  . Father Deceased at age 27  . Mother Alive  . Neg Hx    Social History:   reports that he quit smoking about 14 years ago. His smoking use included Cigarettes. He has a 10.00 pack-year smoking history. He has never used smokeless tobacco. He reports that he drinks about 3.6 oz of alcohol per week . He reports that he does not use drugs.   Social History   Social History Narrative   Married for 37 years.  Works as a Chief Financial Officer for [employer]: He has had some mild back pain  recently. Other than as noted above, the remainder of the review of systems is normal  Physical Exam: BP (!) 158/104   Pulse 84   Temp 97.8 F (36.6 C) (Oral)   Resp 17   Ht 5\' 9"  (1.753 m)   Wt 83.9 kg (185 lb)   SpO2 100%   BMI 27.32 kg/m  General appearance: Pleasant male currently in no acute distress Head: Normocephalic, without obvious abnormality, atraumatic Eyes: conjunctivae/corneas clear. PERRL, EOM's intact. Fundi not examined  Neck: no adenopathy, no carotid bruit, no JVD and supple, symmetrical, trachea midline Lungs: clear to auscultation bilaterally Heart: regular rate and rhythm, S1, S2 normal, no murmur, click, rub or  gallop Abdomen: soft, non-tender; bowel sounds normal; no masses,  no organomegaly Rectal: deferred Extremities: extremities normal, atraumatic, no cyanosis or edema Pulses: 2+ and symmetric Skin: Skin color, texture, turgor normal. No rashes or lesions Neurologic: Grossly normal  Labs: CBC  Recent Labs  01/15/16 2114  WBC 14.7*  RBC 5.09  HGB 15.5  HCT 45.7  PLT 242  MCV 89.9  MCH 30.5  MCHC 33.9  RDW 12.4   CMP   Recent Labs  01/15/16 2114  NA 134*  K 3.8  CL 102  CO2 22  GLUCOSE 169*  BUN 15  CREATININE 0.98  CALCIUM 9.8  PROT 7.0  ALBUMIN 4.2  AST 51*  ALT 68*  ALKPHOS 62  BILITOT 0.6  GFRNONAA >60  GFRAA >60   Cardiac Panel (last 3 results) Troponin (Point of Care Test)  Recent Labs  01/15/16 2135  TROPIPOC 1.92*    EKG: Sinus rhythm, nonspecific ST flattening laterally  Radiology: No active cardiopulmonary disease   IMPRESSIONS: 1.  Chest pain consistent with non-STEMI 2.  Hypertension  PLAN: Admit to telemetry bed, begin intravenous heparin.  Check serial troponins.  Keep nothing by mouth after midnight for catheterization tomorrow.  Catheterization risks discussed again with patient and previously discussed earlier today by Dr. Gwenlyn Found.  Signed: Kerry Hough MD [facility]  01/15/2016, 11:13 PM

## 2016-01-15 NOTE — Progress Notes (Signed)
ANTICOAGULATION CONSULT NOTE - Initial Consult  Pharmacy Consult for Heparin Indication: chest pain/ACS  Allergies  Allergen Reactions  . Aspirin Other (See Comments)    REACTION: acid reflux    Patient Measurements: Height: '5\' 9"'  (175.3 cm) Weight: 185 lb (83.9 kg) IBW/kg (Calculated) : 70.7 Heparin Dosing Weight: 84 kg  Vital Signs: Temp: 97.8 F (36.6 C) (10/03 2033) Temp Source: Oral (10/03 2033) BP: 159/104 (10/03 2100) Pulse Rate: 102 (10/03 2100)  Labs:  Recent Labs  01/15/16 2114  HGB 15.5  HCT 45.7  PLT 242  CREATININE 0.98    Estimated Creatinine Clearance (by C-G formula based on SCr of 0.98 mg/dL) Male: 75.9 mL/min Male: 86.2 mL/min   Medical History: Past Medical History:  Diagnosis Date  . GERD (gastroesophageal reflux disease)   . Perforated ear drum    left ear  . Shortness of breath 05/24/2012   Abnormal MET TEST  . Sleep apnea     Medications:  See electronic med rec  Assessment: 55 y.o. M presents with CP. To begin heparin for r/o ACS. Noted pt saw Dr. Gwenlyn Found 10/3 and was scheduled for cath on [place]. CBC stable.  Goal of Therapy:  Heparin level 0.3-0.7 units/ml Monitor platelets by anticoagulation protocol: Yes   Plan:  Heparin IV bolus 4000 units Heparin gtt at 1050 units/hr Will f/u heparin level in 6 hours Daily heparin level and CBC   Sherlon Handing, PharmD, BCPS Clinical pharmacist, pager [phone] 01/15/2016,10:15 PM

## 2016-01-15 NOTE — ED Provider Notes (Signed)
[facility] DEPT Provider Note   CSN: [id number] Arrival date & time: 01/15/16  2029     History   Chief Complaint Chief Complaint  Patient presents with  . Chest Pain    HPI Luke Smith is a 55 y.o. male.  Patient states that he has been having chest pain off-and-on for a while. He had a stress test that was negative he also had upper endoscopy which did not show disease. He states that he started having mild to moderate chest pain today around 2 PM. he was seen by cardiology and it was arranged for him to get a cardiac cath in 2 days.  The patient states around 6 PM his pain became worse and became sweaty and he came to the hospital   The history is provided by the patient. No language interpreter was used.  Chest Pain   This is a recurrent problem. The current episode started 3 to 5 hours ago. The problem occurs constantly. The problem has been gradually worsening. Associated with: Nothing. The pain is present in the substernal region. The pain is at a severity of 7/10. The pain is moderate. The quality of the pain is described as dull. The pain does not radiate. The symptoms are aggravated by exertion. Pertinent negatives include no abdominal pain, no back pain, no cough and no headaches.  Pertinent negatives for past medical history include no seizures.    Past Medical History:  Diagnosis Date  . GERD (gastroesophageal reflux disease)   . Perforated ear drum    left ear  . Shortness of breath 05/24/2012   Abnormal MET TEST  . Sleep apnea     Patient Active Problem List   Diagnosis Date Noted  . Chest pain with moderate risk of acute coronary syndrome 11/29/2015  . History of smoking 11/29/2015  . Sleep apnea   . Abnormal stress test 09/02/2012  . PERSONAL HISTORY OF COLONIC POLYPS 03/19/2010  . DYSPEPSIA 01/24/2010  . ABDOMINAL PAIN, GENERALIZED 01/24/2010  . NONSPECIFIC ABNORMAL RESULTS LIVR FUNCTION STUDY 01/24/2010    Past Surgical History:  Procedure  Laterality Date  . DENTAL SURGERY  childhood  . NASAL SEPTUM SURGERY      OB History    No data available       Home Medications    Prior to Admission medications   Medication Sig Start Date End Date Taking? Authorizing Provider  aspirin 81 MG EC tablet Take 81 mg by mouth daily.  11/26/15  Yes Historical Provider, MD  dexlansoprazole (DEXILANT) 60 MG capsule Take 1 capsule (60 mg total) by mouth daily. 01/10/16  Yes Mauri Pole, MD  lidocaine (XYLOCAINE) 2 % solution Lidocaine Slurry- 20 mls every 4 to 6 hours as needed for throat pain. Do not eat or drink for 2 hours after using to prevent choking 01/14/16  Yes Mauri Pole, MD  nitroGLYCERIN (NITROSTAT) 0.4 MG SL tablet Place 0.4 mg under the tongue every 5 (five) minutes as needed for chest pain (x 3 doses).  11/26/15  Yes Historical Provider, MD  ranitidine (ZANTAC) 300 MG tablet Take 1 tablet (300 mg total) by mouth at bedtime. 01/11/16  Yes Mauri Pole, MD  zolpidem (AMBIEN) 10 MG tablet Take 1 tablet by mouth at bedtime as needed for sleep.  07/02/12  Yes Historical Provider, MD    Family History Family History  Problem Relation Age of Onset  . Bone cancer Father   . Colon cancer Neg Hx  Social History Social History  Substance Use Topics  . Smoking status: Former Smoker    Packs/day: 0.50    Years: 20.00    Types: Cigarettes    Quit date: 04/14/2001  . Smokeless tobacco: Never Used  . Alcohol use 3.6 oz/week    6 Cans of beer per week     Comment: weekends     Allergies   Aspirin   Review of Systems Review of Systems  Constitutional: Negative for appetite change and fatigue.  HENT: Negative for congestion, ear discharge and sinus pressure.   Eyes: Negative for discharge.  Respiratory: Negative for cough.   Cardiovascular: Positive for chest pain.  Gastrointestinal: Negative for abdominal pain and diarrhea.  Genitourinary: Negative for frequency and hematuria.  Musculoskeletal: Negative  for back pain.  Skin: Negative for rash.  Neurological: Negative for seizures and headaches.  Psychiatric/Behavioral: Negative for hallucinations.     Physical Exam Updated Vital Signs BP (!) 159/104   Pulse 102   Temp 97.8 F (36.6 C) (Oral)   Resp 17   Ht '5\' 9"'  (1.753 m)   Wt 185 lb (83.9 kg)   SpO2 99%   BMI 27.32 kg/m   Physical Exam  Constitutional: He is oriented to person, place, and time. He appears well-developed.  HENT:  Head: Normocephalic.  Eyes: Conjunctivae and EOM are normal. No scleral icterus.  Neck: Neck supple. No thyromegaly present.  Cardiovascular: Normal rate and regular rhythm.  Exam reveals no gallop and no friction rub.   No murmur heard. Pulmonary/Chest: No stridor. He has no wheezes. He has no rales. He exhibits no tenderness.  Abdominal: He exhibits no distension. There is no tenderness. There is no rebound.  Musculoskeletal: Normal range of motion. He exhibits no edema.  Lymphadenopathy:    He has no cervical adenopathy.  Neurological: He is oriented to person, place, and time. He exhibits normal muscle tone. Coordination normal.  Skin: No rash noted. No erythema.  Psychiatric: He has a normal mood and affect. His behavior is normal.     ED Treatments / Results  Labs (all labs ordered are listed, but only abnormal results are displayed) Labs Reviewed  BASIC METABOLIC PANEL - Abnormal; Notable for the following:       Result Value   Sodium 134 (*)    Glucose, Bld 169 (*)    All other components within normal limits  CBC - Abnormal; Notable for the following:    WBC 14.7 (*)    All other components within normal limits  HEPATIC FUNCTION PANEL - Abnormal; Notable for the following:    AST 51 (*)    ALT 68 (*)    All other components within normal limits  LIPASE, BLOOD  D-DIMER, QUANTITATIVE (NOT AT [facility])  HEPARIN LEVEL (UNFRACTIONATED)  HEPARIN LEVEL (UNFRACTIONATED)  I-STAT TROPOININ, ED    EKG  EKG  Interpretation  Date/Time:  Tuesday January 15 2016 20:31:50 EDT Ventricular Rate:  97 PR Interval:    QRS Duration: 101 QT Interval:  376 QTC Calculation: 478 R Axis:   67 Text Interpretation:  Sinus rhythm Probable left atrial enlargement Inferior infarct, age indeterminate Lateral leads are also involved Confirmed by Rennae Ferraiolo  MD, Sagrario Lineberry [id number]) on 01/15/2016 9:49:15 PM       Radiology Dg Chest 2 View  Result Date: 01/15/2016 CLINICAL DATA:  55 year old male with chest pain. EXAM: CHEST  2 VIEW COMPARISON:  None. FINDINGS: The heart size and mediastinal contours are within normal limits. Both  lungs are clear. The visualized skeletal structures are unremarkable. IMPRESSION: No active cardiopulmonary disease. Electronically Signed   By: Anner Crete M.D.   On: 01/15/2016 22:17    Procedures Procedures (including critical care time)  Medications Ordered in ED Medications  heparin bolus via infusion 4,000 Units (not administered)  heparin ADULT infusion 100 units/mL (25000 units/229m sodium chloride 0.45%) (1,050 Units/hr Intravenous New Bag/Given 01/15/16 2233)  metoprolol (LOPRESSOR) injection 5 mg (not administered)  HYDROmorphone (DILAUDID) injection 1 mg (1 mg Intravenous Given 01/15/16 2107)  LORazepam (ATIVAN) injection 0.5 mg (0.5 mg Intravenous Given 01/15/16 2106)  nitroGLYCERIN (NITROGLYN) 2 % ointment 1 inch (1 inch Topical Given 01/15/16 2107)     Initial Impression / Assessment and Plan / ED Course  I have reviewed the triage vital signs and the nursing notes.  Pertinent labs & imaging results that were available during my care of the patient were reviewed by me and considered in my medical decision making (see chart for details).  Clinical Course   CRITICAL CARE Performed by: Naida Escalante L Total critical care time:419mutes Critical care time was exclusive of separately billable procedures and treating other patients. Critical care was necessary to treat or  prevent imminent or life-threatening deterioration. Critical care was time spent personally by me on the following activities: development of treatment plan with patient and/or surrogate as well as nursing, discussions with consultants, evaluation of patient's response to treatment, examination of patient, obtaining history from patient or surrogate, ordering and performing treatments and interventions, ordering and review of laboratory studies, ordering and review of radiographic studies, pulse oximetry and re-evaluation of patient's condition.  Patient had a troponin that was 1.92. Patient's chest pain was improved with 1 inch of Nitropaste 1 mg of Dilaudid and a half milligram Ativan. His EKG had no acute changes. Patient was started on heparin and cardiology will consult on the patient  Final Clinical Impressions(s) / ED Diagnoses   Final diagnoses:  None    New Prescriptions New Prescriptions   No medications on file     JoMilton FergusonMD 01/15/16 2237

## 2016-01-15 NOTE — Patient Instructions (Signed)
Medication Instructions:  NO CHANGES.  PROCEDURES: Your physician has requested that you have a LEFT cardiac catheterization. Cardiac catheterization is used to diagnose and/or treat various heart conditions. Doctors may recommend this procedure for a number of different reasons. The most common reason is to evaluate chest pain. Chest pain can be a symptom of coronary artery disease (CAD), and cardiac catheterization can show whether plaque is narrowing or blocking your heart's arteries. This procedure is also used to evaluate the valves, as well as measure the blood flow and oxygen levels in different parts of your heart. For further information please visit [email or web address].   Following your catheterization, you will not be allowed to drive for 3 days.  No lifting, pushing, or pulling greater that 10 pounds is allowed for 1 week.  You will be required to have the following tests prior to the procedure:  1. Blood work-the blood work can be done no more than 14 days prior to the procedure.  It can be done at any [facility] lab.  There is one downstairs on the first floor of this building and one in the [facility] building [phone]).  2. Chest Xray-the chest xray order has already been placed at the [facility].      Puncture site  RIGHT RADIAL    If you need a refill on your cardiac medications before your next appointment, please call your pharmacy.

## 2016-01-15 NOTE — ED Triage Notes (Signed)
Per [facility]   Pt states he had a cold or a cough about a week ago. 6 weeks agio made an appt with a cardiologist   Substernal CP. And feeling in his throat. Supposed to have a cardiac cath on Thursday. Did well on a stress test 3 weeks ago. Endoscopy on Thur. Heartburn throughout the weekend. Pain went away on itself. When he arrived home the CP came back. Self administered 2 NTG with no relief. 1 NTG with EMS. 6/10 CP prior to EMS NTG and went down to 1/10 pain. No ASA in the last week.

## 2016-01-15 NOTE — Progress Notes (Signed)
01/15/2016 Luke Smith   January 26, 1961  [id number]  Primary Physician Horatio Pel, MD Primary Cardiologist: Lorretta Harp MD Renae Gloss  HPI:  Luke Smith is a fit-appearing 55 year old married Caucasian male father of 2 who works as a Administrator, sports at [employer]. I last saw him in the office 09/02/12. He was referred by Dr. Deland Pretty for cardiovascular evaluation because of an abnormal MET test. His cardiovascular risk profile is  positive for remote tobacco abuse having quit 14 years ago and smoked 10 pack years prior to that. There is no family history of heart disease. Never had a heart attack or stroke. He does have obstructive sleep apnea intolerant to CPAP. I have not seen him back for several years. Over the last month he developed new onset substernal chest burning associated with diaphoresis, shortness of breath and upper extremity radiation. He did undergo colonoscopy and endoscopy recently. This chest pain has been nitrate responsive. A Myoview stress test performed 12/07/15 was low risk evidence of ischemia. There was inferior basal thinning probably related to valve plane artifact.   Current Outpatient Prescriptions  Medication Sig Dispense Refill  . aspirin 81 MG EC tablet Take 81 mg by mouth daily.     . Cholecalciferol (VITAMIN D3) 5000 UNITS TABS Take 5,000 Units by mouth daily.    Marland Kitchen dexlansoprazole (DEXILANT) 60 MG capsule Take 1 capsule (60 mg total) by mouth daily. 90 capsule 3  . fish oil-omega-3 fatty acids 1000 MG capsule Take 1 g by mouth daily.    Marland Kitchen lidocaine (XYLOCAINE) 2 % solution Lidocaine Slurry- 20 mls every 4 to 6 hours as needed for throat pain. Do not eat or drink for 2 hours after using to prevent choking 250 mL 0  . nitroGLYCERIN (NITROSTAT) 0.4 MG SL tablet Place 0.4 mg under the tongue every 5 (five) minutes as needed for chest pain (x 3 doses).     . pantoprazole (PROTONIX) 40 MG tablet     .  ranitidine (ZANTAC) 300 MG tablet Take 1 tablet (300 mg total) by mouth at bedtime. 30 tablet 3  . zolpidem (AMBIEN) 10 MG tablet Take 1 tablet by mouth at bedtime as needed.     Current Facility-Administered Medications  Medication Dose Route Frequency Provider Last Rate Last Dose  . 0.9 %  sodium chloride infusion  500 mL Intravenous Continuous Mauri Pole, MD        Allergies  Allergen Reactions  . Aspirin Other (See Comments)    REACTION: acid reflux    Social History   Social History  . Marital status: Married    Spouse name: N/A  . Number of children: 2  . Years of education: N/A   Occupational History  . engineer    Social History Main Topics  . Smoking status: Former Smoker    Packs/day: 0.50    Years: 20.00    Types: Cigarettes    Quit date: 04/14/2001  . Smokeless tobacco: Never Used  . Alcohol use 3.6 oz/week    6 Cans of beer per week     Comment: weekends  . Drug use: No  . Sexual activity: Not on file   Other Topics Concern  . Not on file   Social History Narrative  . No narrative on file     Review of Systems: General: negative for chills, fever, night sweats or weight changes.  Cardiovascular: negative for chest pain, dyspnea on exertion,  edema, orthopnea, palpitations, paroxysmal nocturnal dyspnea or shortness of breath Dermatological: negative for rash Respiratory: negative for cough or wheezing Urologic: negative for hematuria Abdominal: negative for nausea, vomiting, diarrhea, bright red blood per rectum, melena, or hematemesis Neurologic: negative for visual changes, syncope, or dizziness All other systems reviewed and are otherwise negative except as noted above.    Blood pressure (!) 169/100, pulse 85, height '5\' 9"'  (1.753 m), weight 190 lb 12.8 oz (86.5 kg), SpO2 100 %.  General appearance: alert and no distress Neck: no adenopathy, no carotid bruit, no JVD, supple, symmetrical, trachea midline and thyroid not enlarged, symmetric,  no tenderness/mass/nodules Lungs: clear to auscultation bilaterally Heart: regular rate and rhythm, S1, S2 normal, no murmur, click, rub or gallop Extremities: extremities normal, atraumatic, no cyanosis or edema  EKG not performed today  ASSESSMENT AND PLAN:   Chest pain with moderate risk of acute coronary syndrome Luke Smith has had a month of fairly debilitating chest pain. There are some atypical features were consistent with reflux although the pain has caused diaphoresis, upper extremity radiation, shortness of breath and has been nitrate responsive. A recent Myoview stress test was low risk with diaphragmatic attenuation. Given the severe nature of the pain I feel compelled to perform coronary angiography to rule out with complete certainty at ischemic etiology. We'll plan on performing radial heart cath this coming Thursday.The patient understands that risks included but are not limited to stroke (1 in 1000), death (1 in 46), kidney failure [usually temporary] (1 in 500), bleeding (1 in 200), allergic reaction [possibly serious] (1 in 200). The patient understands and agrees to proceed      Lorretta Harp MD [facility], [facility] 01/15/2016 5:08 PM

## 2016-01-16 ENCOUNTER — Observation Stay (HOSPITAL_COMMUNITY): Payer: BLUE CROSS/BLUE SHIELD

## 2016-01-16 ENCOUNTER — Other Ambulatory Visit: Payer: Self-pay | Admitting: *Deleted

## 2016-01-16 ENCOUNTER — Encounter (HOSPITAL_COMMUNITY): Payer: Self-pay | Admitting: Interventional Cardiology

## 2016-01-16 ENCOUNTER — Encounter (HOSPITAL_COMMUNITY)
Admission: EM | Disposition: A | Discharge status: Home / Self Care | Payer: Self-pay | Source: Home / Self Care | Attending: Surgery

## 2016-01-16 DIAGNOSIS — R079 Chest pain, unspecified: Secondary | ICD-10-CM | POA: Diagnosis present

## 2016-01-16 DIAGNOSIS — I2510 Atherosclerotic heart disease of native coronary artery without angina pectoris: Secondary | ICD-10-CM

## 2016-01-16 DIAGNOSIS — K219 Gastro-esophageal reflux disease without esophagitis: Secondary | ICD-10-CM | POA: Diagnosis present

## 2016-01-16 DIAGNOSIS — I214 Non-ST elevation (NSTEMI) myocardial infarction: Secondary | ICD-10-CM | POA: Diagnosis present

## 2016-01-16 DIAGNOSIS — D62 Acute posthemorrhagic anemia: Secondary | ICD-10-CM | POA: Diagnosis not present

## 2016-01-16 DIAGNOSIS — G4730 Sleep apnea, unspecified: Secondary | ICD-10-CM | POA: Diagnosis present

## 2016-01-16 DIAGNOSIS — Z87891 Personal history of nicotine dependence: Secondary | ICD-10-CM | POA: Diagnosis not present

## 2016-01-16 DIAGNOSIS — K5900 Constipation, unspecified: Secondary | ICD-10-CM | POA: Diagnosis not present

## 2016-01-16 DIAGNOSIS — Z01810 Encounter for preprocedural cardiovascular examination: Secondary | ICD-10-CM | POA: Diagnosis not present

## 2016-01-16 DIAGNOSIS — Z79899 Other long term (current) drug therapy: Secondary | ICD-10-CM | POA: Diagnosis not present

## 2016-01-16 DIAGNOSIS — Z7982 Long term (current) use of aspirin: Secondary | ICD-10-CM | POA: Diagnosis not present

## 2016-01-16 DIAGNOSIS — E8770 Fluid overload, unspecified: Secondary | ICD-10-CM | POA: Diagnosis present

## 2016-01-16 DIAGNOSIS — I10 Essential (primary) hypertension: Secondary | ICD-10-CM | POA: Diagnosis present

## 2016-01-16 DIAGNOSIS — E119 Type 2 diabetes mellitus without complications: Secondary | ICD-10-CM | POA: Diagnosis present

## 2016-01-16 DIAGNOSIS — J982 Interstitial emphysema: Secondary | ICD-10-CM | POA: Diagnosis present

## 2016-01-16 DIAGNOSIS — Z886 Allergy status to analgesic agent status: Secondary | ICD-10-CM | POA: Diagnosis not present

## 2016-01-16 DIAGNOSIS — I25110 Atherosclerotic heart disease of native coronary artery with unstable angina pectoris: Secondary | ICD-10-CM | POA: Diagnosis not present

## 2016-01-16 HISTORY — PX: CARDIAC CATHETERIZATION: SHX172

## 2016-01-16 LAB — TROPONIN I
TROPONIN I: 3.37 ng/mL — AB (ref <0.03)
Troponin I: 10.26 ng/mL (ref <0.03)
Troponin I: 7.43 ng/mL (ref <0.03)

## 2016-01-16 LAB — LIPID PANEL
CHOLESTEROL: 168 mg/dL (ref 0–200)
HDL: 41 mg/dL (ref >40)
LDL CALC: 96 mg/dL (ref 0–99)
Total CHOL/HDL Ratio: 4.1 RATIO
Triglycerides: 156 mg/dL — ABNORMAL HIGH (ref <150)
VLDL: 31 mg/dL (ref 0–40)

## 2016-01-16 LAB — HEPARIN LEVEL (UNFRACTIONATED): HEPARIN UNFRACTIONATED: 0.6 [IU]/mL (ref 0.30–0.70)

## 2016-01-16 LAB — PULMONARY FUNCTION TEST
FEF 25-75 POST: 5.09 L/s
FEF 25-75 PRE: 3.46 L/s
FEF2575-%Change-Post: 46 %
FEF2575-%Pred-Post: 160 %
FEF2575-%Pred-Pre: 109 %
FEV1-%CHANGE-POST: 8 %
FEV1-%PRED-POST: 93 %
FEV1-%PRED-PRE: 85 %
FEV1-POST: 3.41 L
FEV1-Pre: 3.14 L
FEV1FVC-%Change-Post: 6 %
FEV1FVC-%PRED-PRE: 107 %
FEV6-%CHANGE-POST: 1 %
FEV6-%PRED-POST: 84 %
FEV6-%Pred-Pre: 82 %
FEV6-POST: 3.85 L
FEV6-Pre: 3.79 L
FEV6FVC-%CHANGE-POST: 0 %
FEV6FVC-%PRED-POST: 104 %
FEV6FVC-%Pred-Pre: 103 %
FVC-%CHANGE-POST: 1 %
FVC-%PRED-POST: 81 %
FVC-%Pred-Pre: 79 %
FVC-PRE: 3.81 L
FVC-Post: 3.88 L
PRE FEV6/FVC RATIO: 100 %
Post FEV1/FVC ratio: 88 %
Post FEV6/FVC ratio: 100 %
Pre FEV1/FVC ratio: 83 %

## 2016-01-16 LAB — CBC
HCT: 43 % (ref 39.0–52.0)
Hemoglobin: 14.6 g/dL (ref 13.0–17.0)
MCH: 31 pg (ref 26.0–34.0)
MCHC: 34 g/dL (ref 30.0–36.0)
MCV: 91.3 fL (ref 78.0–100.0)
PLATELETS: 214 10*3/uL (ref 150–400)
RBC: 4.71 MIL/uL (ref 4.22–5.81)
RDW: 12.5 % (ref 11.5–15.5)
WBC: 10.3 10*3/uL (ref 4.0–10.5)

## 2016-01-16 LAB — PROTIME-INR
INR: 1.12
Prothrombin Time: 14.4 seconds (ref 11.4–15.2)

## 2016-01-16 LAB — TYPE AND SCREEN
ABO/RH(D): O POS
Antibody Screen: NEGATIVE

## 2016-01-16 SURGERY — LEFT HEART CATH AND CORONARY ANGIOGRAPHY
Anesthesia: LOCAL

## 2016-01-16 MED ORDER — DIAZEPAM 5 MG PO TABS
5.0000 mg | ORAL_TABLET | Freq: Once | ORAL | Status: AC
  Administered 2016-01-17: 5 mg via ORAL
  Filled 2016-01-16: qty 1

## 2016-01-16 MED ORDER — NITROGLYCERIN IN D5W 200-5 MCG/ML-% IV SOLN
INTRAVENOUS | Status: AC
  Filled 2016-01-16: qty 250

## 2016-01-16 MED ORDER — METOPROLOL TARTRATE 25 MG PO TABS
25.0000 mg | ORAL_TABLET | Freq: Two times a day (BID) | ORAL | Status: DC
  Administered 2016-01-16 – 2016-01-17 (×2): 25 mg via ORAL
  Filled 2016-01-16 (×2): qty 1

## 2016-01-16 MED ORDER — ATORVASTATIN CALCIUM 80 MG PO TABS
80.0000 mg | ORAL_TABLET | Freq: Every day | ORAL | Status: DC
  Administered 2016-01-18 – 2016-01-21 (×5): 80 mg via ORAL
  Filled 2016-01-16 (×4): qty 1

## 2016-01-16 MED ORDER — ASPIRIN EC 81 MG PO TBEC: 81.0000 mg | DELAYED_RELEASE_TABLET | Freq: Every day | ORAL | Status: DC

## 2016-01-16 MED ORDER — TRANEXAMIC ACID 1000 MG/10ML IV SOLN
1.5000 mg/kg/h | INTRAVENOUS | Status: AC
  Administered 2016-01-17: 1.5 mg/kg/h via INTRAVENOUS
  Filled 2016-01-16: qty 25

## 2016-01-16 MED ORDER — ACETAMINOPHEN 325 MG PO TABS
650.0000 mg | ORAL_TABLET | ORAL | Status: DC | PRN
  Administered 2016-01-16 – 2016-01-17 (×2): 650 mg via ORAL
  Filled 2016-01-16 (×2): qty 2

## 2016-01-16 MED ORDER — METOPROLOL TARTRATE 5 MG/5ML IV SOLN
INTRAVENOUS | Status: DC | PRN
  Administered 2016-01-16: 5 mg via INTRAVENOUS

## 2016-01-16 MED ORDER — ALBUTEROL SULFATE (2.5 MG/3ML) 0.083% IN NEBU
2.5000 mg | INHALATION_SOLUTION | Freq: Once | RESPIRATORY_TRACT | Status: AC
  Administered 2016-01-16: 2.5 mg via RESPIRATORY_TRACT

## 2016-01-16 MED ORDER — ZOLPIDEM TARTRATE 5 MG PO TABS: 10.0000 mg | ORAL_TABLET | Freq: Every evening | ORAL | Status: DC | PRN

## 2016-01-16 MED ORDER — FENTANYL CITRATE (PF) 100 MCG/2ML IJ SOLN
INTRAMUSCULAR | Status: AC
  Filled 2016-01-16: qty 2

## 2016-01-16 MED ORDER — TEMAZEPAM 15 MG PO CAPS: 15.0000 mg | ORAL_CAPSULE | Freq: Once | ORAL | Status: DC | PRN

## 2016-01-16 MED ORDER — NITROGLYCERIN IN D5W 200-5 MCG/ML-% IV SOLN: 0.0000 ug/min | INTRAVENOUS | Status: DC

## 2016-01-16 MED ORDER — TRANEXAMIC ACID (OHS) BOLUS VIA INFUSION
15.0000 mg/kg | INTRAVENOUS | Status: AC
  Administered 2016-01-17: 1284 mg via INTRAVENOUS
  Filled 2016-01-16: qty 1284

## 2016-01-16 MED ORDER — OXYCODONE-ACETAMINOPHEN 5-325 MG PO TABS: 1.0000 | ORAL_TABLET | ORAL | Status: DC | PRN

## 2016-01-16 MED ORDER — LIDOCAINE HCL (PF) 1 % IJ SOLN
INTRAMUSCULAR | Status: DC | PRN
  Administered 2016-01-16: 2 mL

## 2016-01-16 MED ORDER — SODIUM CHLORIDE 0.9% FLUSH: 3.0000 mL | Freq: Two times a day (BID) | INTRAVENOUS | Status: DC

## 2016-01-16 MED ORDER — POTASSIUM CHLORIDE 2 MEQ/ML IV SOLN
80.0000 meq | INTRAVENOUS | Status: DC
  Filled 2016-01-16: qty 40

## 2016-01-16 MED ORDER — ASPIRIN 81 MG PO CHEW: 81.0000 mg | CHEWABLE_TABLET | ORAL | Status: DC

## 2016-01-16 MED ORDER — SODIUM CHLORIDE 0.9% FLUSH: 3.0000 mL | INTRAVENOUS | Status: DC | PRN

## 2016-01-16 MED ORDER — PANTOPRAZOLE SODIUM 40 MG PO TBEC
40.0000 mg | DELAYED_RELEASE_TABLET | Freq: Every day | ORAL | Status: DC
  Administered 2016-01-16: 40 mg via ORAL
  Filled 2016-01-16: qty 1

## 2016-01-16 MED ORDER — PLASMA-LYTE 148 IV SOLN
INTRAVENOUS | Status: AC
  Administered 2016-01-17: 500 mL
  Filled 2016-01-16: qty 2.5

## 2016-01-16 MED ORDER — ASPIRIN EC 81 MG PO TBEC
81.0000 mg | DELAYED_RELEASE_TABLET | Freq: Every day | ORAL | Status: DC
  Administered 2016-01-16: 81 mg via ORAL
  Filled 2016-01-16: qty 1

## 2016-01-16 MED ORDER — HEPARIN SODIUM (PORCINE) 1000 UNIT/ML IJ SOLN
INTRAMUSCULAR | Status: AC
  Filled 2016-01-16: qty 1

## 2016-01-16 MED ORDER — ONDANSETRON HCL 4 MG/2ML IJ SOLN: 4.0000 mg | Freq: Four times a day (QID) | INTRAMUSCULAR | Status: DC | PRN

## 2016-01-16 MED ORDER — SODIUM CHLORIDE 0.9 % WEIGHT BASED INFUSION: 1.0000 mL/kg/h | INTRAVENOUS | Status: DC

## 2016-01-16 MED ORDER — HEPARIN SODIUM (PORCINE) 1000 UNIT/ML IJ SOLN
INTRAMUSCULAR | Status: DC | PRN
  Administered 2016-01-16: 4500 [IU] via INTRAVENOUS
  Administered 2016-01-16: 4000 [IU] via INTRAVENOUS

## 2016-01-16 MED ORDER — FENTANYL CITRATE (PF) 100 MCG/2ML IJ SOLN
INTRAMUSCULAR | Status: DC | PRN
  Administered 2016-01-16: 50 ug via INTRAVENOUS

## 2016-01-16 MED ORDER — SODIUM CHLORIDE 0.9 % IV SOLN
INTRAVENOUS | Status: DC
  Filled 2016-01-16: qty 30

## 2016-01-16 MED ORDER — HEPARIN (PORCINE) IN NACL 100-0.45 UNIT/ML-% IJ SOLN
1050.0000 [IU]/h | INTRAMUSCULAR | Status: DC
  Administered 2016-01-16: 1050 [IU]/h via INTRAVENOUS
  Filled 2016-01-16: qty 250

## 2016-01-16 MED ORDER — PHENYLEPHRINE HCL 10 MG/ML IJ SOLN
30.0000 ug/min | INTRAVENOUS | Status: AC
  Administered 2016-01-17: 15 ug/min via INTRAVENOUS
  Filled 2016-01-16: qty 2

## 2016-01-16 MED ORDER — SODIUM CHLORIDE 0.9 % IV SOLN: 250.0000 mL | INTRAVENOUS | Status: DC | PRN

## 2016-01-16 MED ORDER — DEXMEDETOMIDINE HCL IN NACL 400 MCG/100ML IV SOLN
0.1000 ug/kg/h | INTRAVENOUS | Status: AC
  Administered 2016-01-17: .3 ug/kg/h via INTRAVENOUS
  Filled 2016-01-16: qty 100

## 2016-01-16 MED ORDER — VANCOMYCIN HCL 10 G IV SOLR
1250.0000 mg | INTRAVENOUS | Status: AC
  Administered 2016-01-17: 1250 mg via INTRAVENOUS
  Filled 2016-01-16: qty 1250

## 2016-01-16 MED ORDER — EPINEPHRINE HCL 1 MG/ML IJ SOLN
0.0000 ug/min | INTRAMUSCULAR | Status: DC
  Filled 2016-01-16: qty 4

## 2016-01-16 MED ORDER — BISACODYL 5 MG PO TBEC
5.0000 mg | DELAYED_RELEASE_TABLET | Freq: Once | ORAL | Status: DC
Start: 2016-01-16 — End: 2016-01-17

## 2016-01-16 MED ORDER — METOPROLOL TARTRATE 12.5 MG HALF TABLET: 12.5000 mg | ORAL_TABLET | Freq: Once | ORAL | Status: DC

## 2016-01-16 MED ORDER — MAGNESIUM SULFATE 50 % IJ SOLN
40.0000 meq | INTRAMUSCULAR | Status: DC
  Filled 2016-01-16: qty 10

## 2016-01-16 MED ORDER — HEPARIN (PORCINE) IN NACL 2-0.9 UNIT/ML-% IJ SOLN
INTRAMUSCULAR | Status: DC | PRN
  Administered 2016-01-16: 1500 mL

## 2016-01-16 MED ORDER — SODIUM CHLORIDE 0.9 % IV SOLN
INTRAVENOUS | Status: AC
  Administered 2016-01-17: 1 [IU]/h via INTRAVENOUS
  Filled 2016-01-16: qty 2.5

## 2016-01-16 MED ORDER — VERAPAMIL HCL 2.5 MG/ML IV SOLN
INTRAVENOUS | Status: DC | PRN
  Administered 2016-01-16: 10 mL via INTRA_ARTERIAL

## 2016-01-16 MED ORDER — IOPAMIDOL (ISOVUE-370) INJECTION 76%
INTRAVENOUS | Status: DC | PRN
  Administered 2016-01-16: 80 mL via INTRA_ARTERIAL

## 2016-01-16 MED ORDER — NITROGLYCERIN IN D5W 200-5 MCG/ML-% IV SOLN
2.0000 ug/min | INTRAVENOUS | Status: AC
  Administered 2016-01-17: 15 ug/min via INTRAVENOUS
  Filled 2016-01-16: qty 250

## 2016-01-16 MED ORDER — MORPHINE SULFATE (PF) 2 MG/ML IV SOLN
2.0000 mg | INTRAVENOUS | Status: DC | PRN
Start: 2016-01-16 — End: 2016-01-17

## 2016-01-16 MED ORDER — HEPARIN (PORCINE) IN NACL 2-0.9 UNIT/ML-% IJ SOLN
INTRAMUSCULAR | Status: AC
  Filled 2016-01-16: qty 1500

## 2016-01-16 MED ORDER — DOPAMINE-DEXTROSE 3.2-5 MG/ML-% IV SOLN
0.0000 ug/kg/min | INTRAVENOUS | Status: DC
Start: 2016-01-17 — End: 2016-01-17
  Filled 2016-01-16: qty 250

## 2016-01-16 MED ORDER — TRANEXAMIC ACID (OHS) PUMP PRIME SOLUTION
2.0000 mg/kg | INTRAVENOUS | Status: DC
  Filled 2016-01-16: qty 1.71

## 2016-01-16 MED ORDER — NITROGLYCERIN 0.4 MG SL SUBL: 0.4000 mg | SUBLINGUAL_TABLET | SUBLINGUAL | Status: DC | PRN

## 2016-01-16 MED ORDER — NITROGLYCERIN IN D5W 200-5 MCG/ML-% IV SOLN
INTRAVENOUS | Status: DC | PRN
  Administered 2016-01-16: 10 ug/min via INTRAVENOUS

## 2016-01-16 MED ORDER — ATORVASTATIN CALCIUM 40 MG PO TABS: 40.0000 mg | ORAL_TABLET | Freq: Every day | ORAL | Status: DC

## 2016-01-16 MED ORDER — IOPAMIDOL (ISOVUE-370) INJECTION 76%
INTRAVENOUS | Status: AC
  Filled 2016-01-16: qty 100

## 2016-01-16 MED ORDER — MIDAZOLAM HCL 2 MG/2ML IJ SOLN
INTRAMUSCULAR | Status: DC | PRN
  Administered 2016-01-16 (×2): 1 mg via INTRAVENOUS

## 2016-01-16 MED ORDER — LIDOCAINE HCL (PF) 1 % IJ SOLN
INTRAMUSCULAR | Status: AC
  Filled 2016-01-16: qty 30

## 2016-01-16 MED ORDER — ASPIRIN 81 MG PO CHEW: 81.0000 mg | CHEWABLE_TABLET | Freq: Every day | ORAL | Status: DC

## 2016-01-16 MED ORDER — CHLORHEXIDINE GLUCONATE CLOTH 2 % EX PADS: 6.0000 | MEDICATED_PAD | Freq: Once | CUTANEOUS | Status: DC

## 2016-01-16 MED ORDER — ALPRAZOLAM 0.25 MG PO TABS: 0.2500 mg | ORAL_TABLET | Freq: Two times a day (BID) | ORAL | Status: DC | PRN

## 2016-01-16 MED ORDER — VERAPAMIL HCL 2.5 MG/ML IV SOLN
INTRAVENOUS | Status: AC
  Filled 2016-01-16: qty 2

## 2016-01-16 MED ORDER — DEXTROSE 5 % IV SOLN
750.0000 mg | INTRAVENOUS | Status: DC
  Filled 2016-01-16: qty 750

## 2016-01-16 MED ORDER — MIDAZOLAM HCL 2 MG/2ML IJ SOLN
INTRAMUSCULAR | Status: AC
  Filled 2016-01-16: qty 2

## 2016-01-16 MED ORDER — SODIUM CHLORIDE 0.9 % WEIGHT BASED INFUSION: 1.0000 mL/kg/h | INTRAVENOUS | Status: AC

## 2016-01-16 MED ORDER — CHLORHEXIDINE GLUCONATE 0.12 % MT SOLN
15.0000 mL | Freq: Once | OROMUCOSAL | Status: AC
  Administered 2016-01-17: 15 mL via OROMUCOSAL
  Filled 2016-01-16: qty 15

## 2016-01-16 MED ORDER — DEXTROSE 5 % IV SOLN
1.5000 g | INTRAVENOUS | Status: AC
  Administered 2016-01-17: .75 g via INTRAVENOUS
  Administered 2016-01-17: 1.5 g via INTRAVENOUS
  Filled 2016-01-16: qty 1.5

## 2016-01-16 MED ORDER — ALPRAZOLAM 0.5 MG PO TABS
0.5000 mg | ORAL_TABLET | Freq: Two times a day (BID) | ORAL | Status: DC | PRN
  Administered 2016-01-16: 0.5 mg via ORAL
  Filled 2016-01-16: qty 1

## 2016-01-16 MED ORDER — SODIUM CHLORIDE 0.9 % WEIGHT BASED INFUSION
3.0000 mL/kg/h | INTRAVENOUS | Status: DC
  Administered 2016-01-16: 3 mL/kg/h via INTRAVENOUS

## 2016-01-16 MED ORDER — METOPROLOL TARTRATE 5 MG/5ML IV SOLN
INTRAVENOUS | Status: AC
  Filled 2016-01-16: qty 5

## 2016-01-16 MED ORDER — CHLORHEXIDINE GLUCONATE CLOTH 2 % EX PADS
6.0000 | MEDICATED_PAD | Freq: Once | CUTANEOUS | Status: AC
  Administered 2016-01-17: 6 via TOPICAL

## 2016-01-16 SURGICAL SUPPLY — 9 items
CATH INFINITI 5 FR JL3.5 (CATHETERS) ×1 IMPLANT
CATH INFINITI JR4 5F (CATHETERS) ×1 IMPLANT
DEVICE RAD COMP TR BAND LRG (VASCULAR PRODUCTS) ×1 IMPLANT
GLIDESHEATH SLEND A-KIT 6F 22G (SHEATH) ×1 IMPLANT
KIT HEART LEFT (KITS) ×2 IMPLANT
PACK CARDIAC CATHETERIZATION (CUSTOM PROCEDURE TRAY) ×2 IMPLANT
TRANSDUCER W/STOPCOCK (MISCELLANEOUS) ×2 IMPLANT
TUBING CIL FLEX 10 FLL-RA (TUBING) ×2 IMPLANT
WIRE SAFE-T 1.5MM-J .035X260CM (WIRE) ×1 IMPLANT

## 2016-01-16 NOTE — Progress Notes (Signed)
ANTICOAGULATION CONSULT NOTE - Follow-up Consult  Pharmacy Consult for Heparin Indication: chest pain/ACS  Allergies  Allergen Reactions  . Aspirin Other (See Comments)    REACTION: acid reflux    Patient Measurements: Height: 5\' 9"  (175.3 cm) Weight: 188 lb 12.8 oz (85.6 kg) IBW/kg (Calculated) : 70.7 Heparin Dosing Weight: 84 kg  Vital Signs: Temp: 97.5 F (36.4 C) (10/04 0009) Temp Source: Oral (10/04 0009) BP: 136/84 (10/04 0009) Pulse Rate: 90 (10/04 0009)  Labs:  Recent Labs  01/15/16 2114 01/16/16 0242 01/16/16 0245  HGB 15.5  --  14.6  HCT 45.7  --  43.0  PLT 242  --  214  HEPARINUNFRC  --  0.60  --   CREATININE 0.98  --   --     Estimated Creatinine Clearance (by C-G formula based on SCr of 0.98 mg/dL) Male: 76.7 mL/min Male: 93.5 mL/min   Assessment: 55 y.o. M on heparin for r/o ACS. Heparin level 0.6 (therapeutic) but drawn a couple of hours early. Plan for cath today.  Goal of Therapy:  Heparin level 0.3-0.7 units/ml Monitor platelets by anticoagulation protocol: Yes   Plan:  Continue heparin gtt at 1050 units/hr Will f/u 6h confirmatory heparin level  Sherlon Handing, PharmD, BCPS Clinical pharmacist, pager [phone] 01/16/2016,3:21 AM

## 2016-01-16 NOTE — Progress Notes (Signed)
ANTICOAGULATION CONSULT NOTE - Follow-up Consult  Pharmacy Consult for Heparin Indication: chest pain/ACS  Allergies  Allergen Reactions  . Aspirin Other (See Comments)    REACTION: acid reflux    Patient Measurements: Height: 5\' 9"  (175.3 cm) Weight: 188 lb 12.8 oz (85.6 kg) IBW/kg (Calculated) : 70.7 Heparin Dosing Weight: 84 kg  Vital Signs: Temp: 97.8 F (36.6 C) (10/04 0359) Temp Source: Oral (10/04 0359) BP: 115/71 (10/04 1030) Pulse Rate: 85 (10/04 1030)  Labs:  Recent Labs  01/15/16 2114 01/16/16 0242 01/16/16 0245 01/16/16 0809  HGB 15.5  --  14.6  --   HCT 45.7  --  43.0  --   PLT 242  --  214  --   LABPROT  --   --  14.4  --   INR  --   --  1.12  --   HEPARINUNFRC  --  0.60  --   --   CREATININE 0.98  --   --   --   TROPONINI  --   --  3.37* 10.26*    Estimated Creatinine Clearance (by C-G formula based on SCr of 0.98 mg/dL) Male: 76.7 mL/min Male: 93.5 mL/min   Assessment: 55 y.o. M on heparin for r/o ACS. He is now s/p cath with 3VCAD with possible plans for CABG. Pharmacy consulted to restart heparin 8 hours post sheath removal (removed ~ 10:15am). -Last heparin rate was 0.6 (collected early) on 1050 units/hr  Goal of Therapy:  Heparin level 0.3-0.7 units/ml Monitor platelets by anticoagulation protocol: Yes   Plan:  -restart heparin at 1050 units/hr at 6:30pm -Heparin level in 8 hours and daily wth CBC daily  Hildred Laser, Pharm D 01/16/2016 11:00 AM

## 2016-01-16 NOTE — Interval H&P Note (Signed)
Cath Lab Visit (complete for each Cath Lab visit)  Clinical Evaluation Leading to the Procedure:   ACS: Yes.    Non-ACS:    Anginal Classification: CCS IV  Anti-ischemic medical therapy: Minimal Therapy (1 class of medications)  Non-Invasive Test Results: Low-risk stress test findings: cardiac mortality <1%/year  Prior CABG: No previous CABG      History and Physical Interval Note:  01/16/2016 9:39 AM  Luke Smith  has presented today for surgery, with the diagnosis of unstable angina  The various methods of treatment have been discussed with the patient and family. After consideration of risks, benefits and other options for treatment, the patient has consented to  Procedure(s): Left Heart Cath and Coronary Angiography (N/A) as a surgical intervention .  The patient's history has been reviewed, patient examined, no change in status, stable for surgery.  I have reviewed the patient's chart and labs.  Questions were answered to the patient's satisfaction.     Luke Smith

## 2016-01-16 NOTE — Progress Notes (Signed)
Back to Room from Cath Lab via pt bed. Denies CP, Denies SOB or other c/o at this time. TR Band intact to Rt wrist with 10 cc air pressure as applied in cath lab. + RP. RUE elevated on pillow. No s/s bruise, bleed, hematoma or other complications at site. Instructed to call prn s/s bleed, numbness, tingling or other. Verbalized understanding. Call Bell in reach.

## 2016-01-16 NOTE — Consult Note (Signed)
[address]       [place] [address]             [phone]      Cardiothoracic Surgery Consultation  Reason for Consult: Severe multi-vessel coronary artery disease s/p NSTEMI Referring Physician:  Dr. Daneen Schick Primary Physician: Dr. Deland Pretty  JI FELDNER is an 55 y.o. male.  HPI:   The patient is a 55 year old gentleman with a history of hypertension, sleep apnea and remote smoking who reports a several month history of substernal chest discomfort radiation up into his throat which he thought was GERD. He underwent EGD and colonoscopy recently which were normal.He was recently in [place] and doing a lot of walking and got the pain but kept walking and it seemed to get better. He was seen by Dr. Gwenlyn Found and underwent a nuclear stress test on 12/05/2015 which showed an EF of 56% with no ischemic changes. His symptoms were not getting better and were occurring at rest and with exertion. He saw Dr. Gwenlyn Found yesterday and arrangements were made for cath on Thursday. After leaving the office he had worsening chest discomfort and diaphoresis and he called 911 last pm. His ECG initially was unremarkable but his troponin poc was 1.92. He was treated with NTG and pain meds and he improved. Cath today shows severe multivessel CAD with 99% mid RCA stenosis with TIMI 2 flow and left to right collaterals. There is a 95% mid LCX stenosis after the first large OM, 85% mid LAD and 75% apical LAD. There is a large diagonal with 70% proximal stenosis. LVEF is 45-50% with an elevated LVEDP of 23. He did not have any further pain until tonight after eating a large dinner and he started getting the typical chest pain which he is having now. His troponin did peak this am at 10.26   Past Medical History:  Diagnosis Date  . GERD (gastroesophageal reflux disease)   . Hypertension   . Perforated ear drum    left ear  . Sleep apnea     Past Surgical History:  Procedure Laterality Date    . CARDIAC CATHETERIZATION N/A 01/16/2016   Procedure: Left Heart Cath and Coronary Angiography;  Surgeon: Belva Crome, MD;  Location: [facility];  Service: Cardiovascular;  Laterality: N/A;  . DENTAL SURGERY  childhood  . NASAL SEPTUM SURGERY      Family History  Problem Relation Age of Onset  . Bone cancer Father   . Colon cancer Neg Hx     Social History:  reports that he quit smoking about 14 years ago. His smoking use included Cigarettes. He has a 10.00 pack-year smoking history. He has never used smokeless tobacco. He reports that he drinks about 3.6 oz of alcohol per week . He reports that he does not use drugs.  Allergies:  Allergies  Allergen Reactions  . Aspirin Other (See Comments)    REACTION: acid reflux    Medications:  I have reviewed the patient's current medications. Prior to Admission:  Facility-Administered Medications Prior to Admission  Medication Dose Route Frequency Provider Last Rate Last Dose  . 0.9 %  sodium chloride infusion  500 mL Intravenous Continuous Mauri Pole, MD       Prescriptions Prior to Admission  Medication Sig Dispense Refill Last Dose  . aspirin 81 MG EC tablet Take 81 mg by mouth daily.    01/15/2016 at Unknown time  .  dexlansoprazole (DEXILANT) 60 MG capsule Take 1 capsule (60 mg total) by mouth daily. 90 capsule 3 01/15/2016 at Unknown time  . lidocaine (XYLOCAINE) 2 % solution Lidocaine Slurry- 20 mls every 4 to 6 hours as needed for throat pain. Do not eat or drink for 2 hours after using to prevent choking 250 mL 0 unk  . nitroGLYCERIN (NITROSTAT) 0.4 MG SL tablet Place 0.4 mg under the tongue every 5 (five) minutes as needed for chest pain (x 3 doses).    unk  . ranitidine (ZANTAC) 300 MG tablet Take 1 tablet (300 mg total) by mouth at bedtime. 30 tablet 3 01/14/2016 at Unknown time  . zolpidem (AMBIEN) 10 MG tablet Take 1 tablet by mouth at bedtime as needed for sleep.    last month   Scheduled: . aspirin EC  81 mg  Oral Daily  . [START ON 01/17/2016] atorvastatin  80 mg Oral q1800  . metoprolol tartrate  25 mg Oral BID  . pantoprazole  40 mg Oral Daily  . sodium chloride flush  3 mL Intravenous Q12H   Continuous: . heparin 1,050 Units/hr (01/16/16 1824)  . nitroGLYCERIN 15 mcg/min (01/16/16 1656)   JKK:XFGHWE chloride, acetaminophen, ALPRAZolam, morphine injection, nitroGLYCERIN, ondansetron (ZOFRAN) IV, oxyCODONE-acetaminophen, sodium chloride flush, zolpidem Anti-infectives    None      Results for orders placed or performed during the hospital encounter of 01/15/16 (from the past 48 hour(s))  D-dimer, quantitative (not at [facility])     Status: None   Collection Time: 01/15/16  8:55 PM  Result Value Ref Range   D-Dimer, Quant 0.28 0.00 - 0.50 ug/mL-FEU    Comment: (NOTE) At the manufacturer cut-off of 0.50 ug/mL FEU, this assay has been documented to exclude PE with a sensitivity and negative predictive value of 97 to 99%.  At this time, this assay has not been approved by the FDA to exclude DVT/VTE. Results should be correlated with clinical presentation.   Basic metabolic panel     Status: Abnormal   Collection Time: 01/15/16  9:14 PM  Result Value Ref Range   Sodium 134 (L) 135 - 145 mmol/L   Potassium 3.8 3.5 - 5.1 mmol/L   Chloride 102 101 - 111 mmol/L   CO2 22 22 - 32 mmol/L   Glucose, Bld 169 (H) 65 - 99 mg/dL   BUN 15 6 - 20 mg/dL   Creatinine, Ser 0.98 0.61 - 1.24 mg/dL   Calcium 9.8 8.9 - 10.3 mg/dL   GFR calc non Af Amer >60 >60 mL/min   GFR calc Af Amer >60 >60 mL/min    Comment: (NOTE) The eGFR has been calculated using the CKD EPI equation. This calculation has not been validated in all clinical situations. eGFR's persistently <60 mL/min signify possible Chronic Kidney Disease.    Anion gap 10 5 - 15  CBC     Status: Abnormal   Collection Time: 01/15/16  9:14 PM  Result Value Ref Range   WBC 14.7 (H) 4.0 - 10.5 K/uL   RBC 5.09 4.22 - 5.81 MIL/uL   Hemoglobin 15.5  13.0 - 17.0 g/dL   HCT 45.7 39.0 - 52.0 %   MCV 89.9 78.0 - 100.0 fL   MCH 30.5 26.0 - 34.0 pg   MCHC 33.9 30.0 - 36.0 g/dL   RDW 12.4 11.5 - 15.5 %   Platelets 242 150 - 400 K/uL  Hepatic function panel     Status: Abnormal   Collection Time: 01/15/16  9:14 PM  Result Value Ref Range   Total Protein 7.0 6.5 - 8.1 g/dL   Albumin 4.2 3.5 - 5.0 g/dL   AST 51 (H) 15 - 41 U/L   ALT 68 (H) 17 - 63 U/L   Alkaline Phosphatase 62 38 - 126 U/L   Total Bilirubin 0.6 0.3 - 1.2 mg/dL   Bilirubin, Direct 0.2 0.1 - 0.5 mg/dL   Indirect Bilirubin 0.4 0.3 - 0.9 mg/dL  Lipase, blood     Status: None   Collection Time: 01/15/16  9:14 PM  Result Value Ref Range   Lipase 34 11 - 51 U/L  I-stat troponin, ED     Status: Abnormal   Collection Time: 01/15/16  9:35 PM  Result Value Ref Range   Troponin i, poc 1.92 (HH) 0.00 - 0.08 ng/mL   Comment NOTIFIED PHYSICIAN    Comment 3            Comment: Due to the release kinetics of cTnI, a negative result within the first hours of the onset of symptoms does not rule out myocardial infarction with certainty. If myocardial infarction is still suspected, repeat the test at appropriate intervals.   Heparin level (unfractionated)     Status: None   Collection Time: 01/16/16  2:42 AM  Result Value Ref Range   Heparin Unfractionated 0.60 0.30 - 0.70 IU/mL    Comment:        IF HEPARIN RESULTS ARE BELOW EXPECTED VALUES, AND PATIENT DOSAGE HAS BEEN CONFIRMED, SUGGEST FOLLOW UP TESTING OF ANTITHROMBIN III LEVELS.   CBC     Status: None   Collection Time: 01/16/16  2:45 AM  Result Value Ref Range   WBC 10.3 4.0 - 10.5 K/uL   RBC 4.71 4.22 - 5.81 MIL/uL   Hemoglobin 14.6 13.0 - 17.0 g/dL   HCT 43.0 39.0 - 52.0 %   MCV 91.3 78.0 - 100.0 fL   MCH 31.0 26.0 - 34.0 pg   MCHC 34.0 30.0 - 36.0 g/dL   RDW 12.5 11.5 - 15.5 %   Platelets 214 150 - 400 K/uL  Troponin I-(serum)     Status: Abnormal   Collection Time: 01/16/16  2:45 AM  Result Value Ref Range    Troponin I 3.37 (HH) <0.03 ng/mL    Comment: CRITICAL RESULT CALLED TO, READ BACK BY AND VERIFIED WITH: BUCKNER B,RN 01/16/16 0341 WAYK   Protime-INR     Status: None   Collection Time: 01/16/16  2:45 AM  Result Value Ref Range   Prothrombin Time 14.4 11.4 - 15.2 seconds   INR 1.12   Lipid panel     Status: Abnormal   Collection Time: 01/16/16  2:48 AM  Result Value Ref Range   Cholesterol 168 0 - 200 mg/dL   Triglycerides 156 (H) <150 mg/dL   HDL 41 >40 mg/dL   Total CHOL/HDL Ratio 4.1 RATIO   VLDL 31 0 - 40 mg/dL   LDL Cholesterol 96 0 - 99 mg/dL    Comment:        Total Cholesterol/HDL:CHD Risk Coronary Heart Disease Risk Table                     Men   Women  1/2 Average Risk   3.4   3.3  Average Risk       5.0   4.4  2 X Average Risk   9.6   7.1  3 X Average Risk  23.4  11.0        Use the calculated Patient Ratio above and the CHD Risk Table to determine the patient's CHD Risk.        ATP III CLASSIFICATION (LDL):  <100     mg/dL   Optimal  100-129  mg/dL   Near or Above                    Optimal  130-159  mg/dL   Borderline  160-189  mg/dL   High  >190     mg/dL   Very High   Troponin I-(serum)     Status: Abnormal   Collection Time: 01/16/16  8:09 AM  Result Value Ref Range   Troponin I 10.26 (HH) <0.03 ng/mL    Comment: CRITICAL VALUE NOTED.  VALUE IS CONSISTENT WITH PREVIOUSLY REPORTED AND CALLED VALUE.  Troponin I-(serum)     Status: Abnormal   Collection Time: 01/16/16 12:56 PM  Result Value Ref Range   Troponin I 7.43 (HH) <0.03 ng/mL    Comment: CRITICAL VALUE NOTED.  VALUE IS CONSISTENT WITH PREVIOUSLY REPORTED AND CALLED VALUE.    Dg Chest 2 View  Result Date: 01/15/2016 CLINICAL DATA:  55 year old male with chest pain. EXAM: CHEST  2 VIEW COMPARISON:  None. FINDINGS: The heart size and mediastinal contours are within normal limits. Both lungs are clear. The visualized skeletal structures are unremarkable. IMPRESSION: No active cardiopulmonary  disease. Electronically Signed   By: Anner Crete M.D.   On: 01/15/2016 22:17    Review of Systems  Constitutional: Positive for diaphoresis, malaise/fatigue and weight loss. Negative for chills and fever.  HENT: Negative.   Eyes: Negative.   Respiratory: Negative for cough and shortness of breath.   Cardiovascular: Positive for chest pain. Negative for palpitations, orthopnea, leg swelling and PND.  Gastrointestinal: Positive for heartburn. Negative for abdominal pain, constipation, diarrhea, nausea and vomiting.  Genitourinary: Negative.   Musculoskeletal: Negative.   Skin: Negative.   Neurological: Negative.   Endo/Heme/Allergies: Negative.   Psychiatric/Behavioral: Negative.    Blood pressure (!) 145/81, pulse (!) 103, temperature 98.1 F (36.7 C), temperature source Oral, resp. rate 15, height 5' 9" (1.753 m), weight 85.6 kg (188 lb 12.8 oz), SpO2 97 %. Physical Exam  Constitutional: He is oriented to person, place, and time. He appears well-developed and well-nourished.  Looks uncomfortable  HENT:  Head: Normocephalic and atraumatic.  Mouth/Throat: Oropharynx is clear and moist.  Eyes: EOM are normal. Pupils are equal, round, and reactive to light.  Neck: Normal range of motion. Neck supple. No JVD present. No thyromegaly present.  Cardiovascular: Normal rate, regular rhythm, normal heart sounds and intact distal pulses.  Exam reveals no gallop and no friction rub.   No murmur heard. Respiratory: Effort normal and breath sounds normal. No respiratory distress. He has no rales.  GI: Soft. Bowel sounds are normal. He exhibits no distension and no mass. There is no tenderness.  Musculoskeletal: Normal range of motion. He exhibits no edema.  Lymphadenopathy:    He has no cervical adenopathy.  Neurological: He is alert and oriented to person, place, and time. He has normal strength. No cranial nerve deficit or sensory deficit.  Skin: Skin is warm and dry.  Psychiatric: He has  a normal mood and affect.   Belva Crome, MD (Primary)    Procedures   Left Heart Cath and Coronary Angiography  Conclusion    Severe three-vessel coronary disease with 99% mid RCA serving  as a culprit for presentation and non-ST elevation MI. RCA has TIMI grade 2 flow and has left-to-right collaterals.  85% segmental mid LAD stenosis and apical 75% stenosis. The large diagonal contains segmental 60-70% narrowing.  90-95% mid circumflex after the first large obtuse marginal branch origin.  Inferobasal akinesis with mid inferior wall hypokinesis. EF 45-50%. Elevated LVEDP.  RECOMMENDATIONS:   After discussion with the patient, TCTS consult for surgical revascularization was recommended. TCT S was notified. Potential Friday surgery.  Start beta blocker therapy.  Resume IV heparin  Stepdown status.  Prolonged pain prior to surgery unresolved by medical therapy should potentially be treated with PCI of the right coronary.  Indications   NSTEMI (non-ST elevated myocardial infarction) ([place]) [I21.4 (ICD-10-CM)]  Procedural Details/Technique   Technical Details The right radial area was sterilely prepped and draped. Intravenous sedation with Versed and fentanyl was administered. 1% Xylocaine was infiltrated to achieve local analgesia. A double wall stick with an angiocath was utilized to obtain intra-arterial access. The modified Seldinger technique was used to place a 7F " Slender" sheath in the right radial artery. Weight based heparin was administered. Coronary angiography was done using 5 F catheters. Right coronary angiography was performed with a JR4. Left ventricular hemodymic recordings and angiography was done using the JR 4 catheter and hand injection. Left coronary angiography was performed with a JL 3.5 cm.  Hemostasis was achieved using a pneumatic band.  During this procedure the patient is administered a total of Versed 2 mg and Fentanyl 50 mg to achieve and maintain  moderate conscious sedation. The patient's heart rate, blood pressure, and oxygen saturation are monitored continuously during the procedure. The period of conscious sedation is 23 minutes, of which I was present face-to-face 100% of this time.   Estimated blood loss <50 mL.  During this procedure the patient was administered the following to achieve and maintain moderate conscious sedation: Versed 2 mg, Fentanyl 50 mcg, while the patient's heart rate, blood pressure, and oxygen saturation were continuously monitored. The period of conscious sedation was 23 minutes, of which I was present face-to-face 100% of this time.    Coronary Findings   Dominance: Co-dominant  Left Anterior Descending  Prox LAD lesion, 40% stenosed. The lesion is eccentric.  Mid LAD lesion, 85% stenosed.  Dist LAD-1 lesion, 50% stenosed.  Dist LAD-2 lesion, 70% stenosed.  First Diagonal Branch  Vessel is small in size.  Second Diagonal Branch  2nd Diag lesion, 70% stenosed.  Third Diagonal Branch  Vessel is small in size.  Left Circumflex  Mid Cx to Dist Cx lesion, 95% stenosed.  Second Obtuse Marginal Branch  Vessel is small in size.  Right Coronary Artery  Mid RCA lesion, 99% stenosed. The lesion is type C with left-to-right collateral flow.  Right Posterior Descending Artery  RPDA filled by collaterals from Dist LAD.  First Right Posterolateral  1st RPLB filled by collaterals from 3rd Mrg.  Wall Motion   Resting               Left Heart   Left Ventricle The left ventricular size is normal. There is mild left ventricular systolic dysfunction. LV end diastolic pressure is mildly elevated. The left ventricular ejection fraction is 45-50% by visual estimate. There are LV function abnormalities due to segmental dysfunction.    Coronary Diagrams   Diagnostic Diagram     Implants     No implant documentation for this case.  PACS Images   Show images  for Cardiac catheterization   Link to  Procedure Log   Procedure Log    Hemo Data   Flowsheet Row Most Recent Value  AO Systolic Pressure 945 mmHg  AO Diastolic Pressure 79 mmHg  AO Mean 99 mmHg  LV Systolic Pressure 038 mmHg  LV Diastolic Pressure 0 mmHg  LV EDP 23 mmHg  Arterial Occlusion Pressure Extended Systolic Pressure 882 mmHg  Arterial Occlusion Pressure Extended Diastolic Pressure 74 mmHg  Arterial Occlusion Pressure Extended Mean Pressure 91 mmHg  Left Ventricular Apex Extended Systolic Pressure 800 mmHg  Left Ventricular Apex Extended Diastolic Pressure 7 mmHg  Left Ventricular Apex Extended EDP Pressure 26 mmHg    Assessment/Plan:  I have personally reviewed and interpreted his cardiac cath. This 55 year old has severe multi-vessel CAD with subtotal stenoses in the mid RCA and mid LCX as well as significant LAD and diagonal stenoses. He has had onset of angina after eating dinner tonight and is having his NTG titrated up. He is to start on heparin soon. I think CABG is the best long term treatment for his coronary artery disease and I would do that tonight if he had not eating a large dinner a few minutes ago. I discussed the case with Dr. Burt Knack and I would plan to do tomorrow afternoon if he can be cooled off with NTG and heparin tonight. If he continues to have chest pain then I think the best alternative is return to the cath lab for PCI of the RCA and possibly the LCX. I reviewed the cath with the patient and his wife. I discussed the operative procedure with them including alternatives, benefits and risks; including but not limited to bleeding, blood transfusion, infection, stroke, myocardial infarction, graft failure, heart block requiring a permanent pacemaker, organ dysfunction, and death.  [name] understands and agrees to proceed.  We will schedule surgery for tomorrow afternoon unless he requires PCI tonight.  I spent 80 minutes performing this consultation and > 50% of this time was spent face to  face counseling and coordinating the care of this patient's severe multi-vessel coronary artery disease.    Gaye Pollack 01/16/2016, 6:15 PM

## 2016-01-17 ENCOUNTER — Inpatient Hospital Stay (HOSPITAL_COMMUNITY): Payer: BLUE CROSS/BLUE SHIELD

## 2016-01-17 ENCOUNTER — Encounter (HOSPITAL_COMMUNITY)
Admission: EM | Disposition: A | Discharge status: Home / Self Care | Payer: Self-pay | Source: Home / Self Care | Attending: Surgery

## 2016-01-17 ENCOUNTER — Inpatient Hospital Stay (HOSPITAL_COMMUNITY): Payer: BLUE CROSS/BLUE SHIELD | Admitting: Anesthesiology

## 2016-01-17 ENCOUNTER — Encounter (HOSPITAL_COMMUNITY): Payer: Self-pay | Admitting: Certified Registered Nurse Anesthetist

## 2016-01-17 ENCOUNTER — Encounter (HOSPITAL_COMMUNITY): Payer: Self-pay

## 2016-01-17 ENCOUNTER — Ambulatory Visit (HOSPITAL_COMMUNITY): Admit: 2016-01-17 | Payer: BLUE CROSS/BLUE SHIELD | Admitting: Cardiovascular Disease

## 2016-01-17 DIAGNOSIS — Z951 Presence of aortocoronary bypass graft: Secondary | ICD-10-CM | POA: Diagnosis present

## 2016-01-17 DIAGNOSIS — Z01810 Encounter for preprocedural cardiovascular examination: Secondary | ICD-10-CM

## 2016-01-17 DIAGNOSIS — Z87891 Personal history of nicotine dependence: Secondary | ICD-10-CM

## 2016-01-17 DIAGNOSIS — I214 Non-ST elevation (NSTEMI) myocardial infarction: Principal | ICD-10-CM

## 2016-01-17 HISTORY — PX: CORONARY ARTERY BYPASS GRAFT: SHX141

## 2016-01-17 HISTORY — PX: TEE WITHOUT CARDIOVERSION: SHX5443

## 2016-01-17 LAB — POCT I-STAT, CHEM 8
BUN: 14 mg/dL (ref 6–20)
BUN: 13 mg/dL (ref 6–20)
BUN: 13 mg/dL (ref 6–20)
BUN: 13 mg/dL (ref 6–20)
BUN: 13 mg/dL (ref 6–20)
CALCIUM ION: 1.24 mmol/L (ref 1.15–1.40)
CALCIUM ION: 1.07 mmol/L — AB (ref 1.15–1.40)
CHLORIDE: 102 mmol/L (ref 101–111)
CHLORIDE: 102 mmol/L (ref 101–111)
CHLORIDE: 100 mmol/L — AB (ref 101–111)
CHLORIDE: 97 mmol/L — AB (ref 101–111)
CHLORIDE: 99 mmol/L — AB (ref 101–111)
CREATININE: 0.7 mg/dL (ref 0.61–1.24)
CREATININE: 0.7 mg/dL (ref 0.61–1.24)
Calcium, Ion: 1.23 mmol/L (ref 1.15–1.40)
Calcium, Ion: 1.05 mmol/L — ABNORMAL LOW (ref 1.15–1.40)
Calcium, Ion: 1.1 mmol/L — ABNORMAL LOW (ref 1.15–1.40)
Creatinine, Ser: 0.7 mg/dL (ref 0.61–1.24)
Creatinine, Ser: 0.8 mg/dL (ref 0.61–1.24)
Creatinine, Ser: 0.8 mg/dL (ref 0.61–1.24)
GLUCOSE: 102 mg/dL — AB (ref 65–99)
GLUCOSE: 128 mg/dL — AB (ref 65–99)
Glucose, Bld: 108 mg/dL — ABNORMAL HIGH (ref 65–99)
Glucose, Bld: 113 mg/dL — ABNORMAL HIGH (ref 65–99)
Glucose, Bld: 175 mg/dL — ABNORMAL HIGH (ref 65–99)
HCT: 26 % — ABNORMAL LOW (ref 39.0–52.0)
HEMATOCRIT: 36 % — AB (ref 39.0–52.0)
HEMATOCRIT: 32 % — AB (ref 39.0–52.0)
HEMATOCRIT: 27 % — AB (ref 39.0–52.0)
HEMATOCRIT: 28 % — AB (ref 39.0–52.0)
HEMOGLOBIN: 10.9 g/dL — AB (ref 13.0–17.0)
HEMOGLOBIN: 9.2 g/dL — AB (ref 13.0–17.0)
Hemoglobin: 12.2 g/dL — ABNORMAL LOW (ref 13.0–17.0)
Hemoglobin: 8.8 g/dL — ABNORMAL LOW (ref 13.0–17.0)
Hemoglobin: 9.5 g/dL — ABNORMAL LOW (ref 13.0–17.0)
POTASSIUM: 4.4 mmol/L (ref 3.5–5.1)
POTASSIUM: 4.3 mmol/L (ref 3.5–5.1)
POTASSIUM: 4.9 mmol/L (ref 3.5–5.1)
Potassium: 4.1 mmol/L (ref 3.5–5.1)
Potassium: 4.5 mmol/L (ref 3.5–5.1)
SODIUM: 139 mmol/L (ref 135–145)
SODIUM: 138 mmol/L (ref 135–145)
SODIUM: 131 mmol/L — AB (ref 135–145)
SODIUM: 134 mmol/L — AB (ref 135–145)
Sodium: 139 mmol/L (ref 135–145)
TCO2: 26 mmol/L (ref 0–100)
TCO2: 27 mmol/L (ref 0–100)
TCO2: 27 mmol/L (ref 0–100)
TCO2: 26 mmol/L (ref 0–100)
TCO2: 24 mmol/L (ref 0–100)

## 2016-01-17 LAB — BASIC METABOLIC PANEL
Anion gap: 11 (ref 5–15)
BUN: 12 mg/dL (ref 6–20)
CALCIUM: 9.3 mg/dL (ref 8.9–10.3)
CO2: 27 mmol/L (ref 22–32)
CREATININE: 1.22 mg/dL (ref 0.61–1.24)
Chloride: 100 mmol/L — ABNORMAL LOW (ref 101–111)
Glucose, Bld: 127 mg/dL — ABNORMAL HIGH (ref 65–99)
Potassium: 4 mmol/L (ref 3.5–5.1)
SODIUM: 138 mmol/L (ref 135–145)

## 2016-01-17 LAB — POCT I-STAT 3, ART BLOOD GAS (G3+)
ACID-BASE DEFICIT: 1 mmol/L (ref 0.0–2.0)
Acid-Base Excess: 3 mmol/L — ABNORMAL HIGH (ref 0.0–2.0)
Acid-base deficit: 2 mmol/L (ref 0.0–2.0)
BICARBONATE: 27.7 mmol/L (ref 20.0–28.0)
BICARBONATE: 24.3 mmol/L (ref 20.0–28.0)
Bicarbonate: 23.9 mmol/L (ref 20.0–28.0)
O2 Saturation: 100 %
O2 Saturation: 97 %
O2 Saturation: 92 %
PCO2 ART: 41 mmHg (ref 32.0–48.0)
PCO2 ART: 48 mmHg (ref 32.0–48.0)
PH ART: 7.438 (ref 7.350–7.450)
TCO2: 29 mmol/L (ref 0–100)
TCO2: 26 mmol/L (ref 0–100)
TCO2: 25 mmol/L (ref 0–100)
pCO2 arterial: 41.6 mmHg (ref 32.0–48.0)
pH, Arterial: 7.374 (ref 7.350–7.450)
pH, Arterial: 7.309 — ABNORMAL LOW (ref 7.350–7.450)
pO2, Arterial: 369 mmHg — ABNORMAL HIGH (ref 83.0–108.0)
pO2, Arterial: 91 mmHg (ref 83.0–108.0)
pO2, Arterial: 71 mmHg — ABNORMAL LOW (ref 83.0–108.0)

## 2016-01-17 LAB — CBC
HCT: 44.2 % (ref 39.0–52.0)
HEMATOCRIT: 34.7 % — AB (ref 39.0–52.0)
HEMOGLOBIN: 14.4 g/dL (ref 13.0–17.0)
Hemoglobin: 11.5 g/dL — ABNORMAL LOW (ref 13.0–17.0)
MCH: 30.4 pg (ref 26.0–34.0)
MCH: 30 pg (ref 26.0–34.0)
MCHC: 32.6 g/dL (ref 30.0–36.0)
MCHC: 33.1 g/dL (ref 30.0–36.0)
MCV: 93.2 fL (ref 78.0–100.0)
MCV: 90.6 fL (ref 78.0–100.0)
PLATELETS: 222 10*3/uL (ref 150–400)
PLATELETS: 169 10*3/uL (ref 150–400)
RBC: 4.74 MIL/uL (ref 4.22–5.81)
RBC: 3.83 MIL/uL — AB (ref 4.22–5.81)
RDW: 12.8 % (ref 11.5–15.5)
RDW: 12.4 % (ref 11.5–15.5)
WBC: 9.5 10*3/uL (ref 4.0–10.5)
WBC: 14.9 10*3/uL — AB (ref 4.0–10.5)

## 2016-01-17 LAB — VAS US DOPPLER PRE CABG
LCCAPDIAS: 17 cm/s
LEFT ECA DIAS: -13 cm/s
LEFT VERTEBRAL DIAS: 10 cm/s
LICAPDIAS: -18 cm/s
LICAPSYS: -73 cm/s
Left CCA dist dias: -21 cm/s
Left CCA dist sys: -70 cm/s
Left CCA prox sys: 81 cm/s
Left ICA dist dias: -16 cm/s
Left ICA dist sys: -57 cm/s
RCCAPDIAS: 19 cm/s
RCCAPSYS: 91 cm/s
RIGHT ECA DIAS: -11 cm/s
RIGHT VERTEBRAL DIAS: 12 cm/s
Right cca dist sys: -59 cm/s

## 2016-01-17 LAB — APTT: APTT: 36 s (ref 24–36)

## 2016-01-17 LAB — POCT I-STAT 4, (NA,K, GLUC, HGB,HCT)
Glucose, Bld: 145 mg/dL — ABNORMAL HIGH (ref 65–99)
HCT: 33 % — ABNORMAL LOW (ref 39.0–52.0)
HEMOGLOBIN: 11.2 g/dL — AB (ref 13.0–17.0)
POTASSIUM: 4 mmol/L (ref 3.5–5.1)
Sodium: 139 mmol/L (ref 135–145)

## 2016-01-17 LAB — GLUCOSE, CAPILLARY
GLUCOSE-CAPILLARY: 133 mg/dL — AB (ref 65–99)
Glucose-Capillary: 131 mg/dL — ABNORMAL HIGH (ref 65–99)

## 2016-01-17 LAB — HEPARIN LEVEL (UNFRACTIONATED): HEPARIN UNFRACTIONATED: 0.49 [IU]/mL (ref 0.30–0.70)

## 2016-01-17 LAB — PROTIME-INR
INR: 1.28
Prothrombin Time: 16.1 seconds — ABNORMAL HIGH (ref 11.4–15.2)

## 2016-01-17 LAB — MRSA PCR SCREENING: MRSA BY PCR: NEGATIVE

## 2016-01-17 LAB — ABO/RH: ABO/RH(D): O POS

## 2016-01-17 LAB — PLATELET COUNT

## 2016-01-17 SURGERY — LEFT HEART CATH AND CORONARY ANGIOGRAPHY

## 2016-01-17 SURGERY — CORONARY ARTERY BYPASS GRAFTING (CABG)
Anesthesia: General | Site: Chest

## 2016-01-17 MED ORDER — LACTATED RINGERS IV SOLN: INTRAVENOUS | Status: DC

## 2016-01-17 MED ORDER — LACTATED RINGERS IV SOLN
INTRAVENOUS | Status: DC | PRN
  Administered 2016-01-17 (×2): via INTRAVENOUS

## 2016-01-17 MED ORDER — METOPROLOL TARTRATE 12.5 MG HALF TABLET
12.5000 mg | ORAL_TABLET | Freq: Two times a day (BID) | ORAL | Status: DC
  Administered 2016-01-18: 12.5 mg via ORAL
  Filled 2016-01-17 (×2): qty 1

## 2016-01-17 MED ORDER — METOPROLOL TARTRATE 25 MG/10 ML ORAL SUSPENSION: 12.5000 mg | Freq: Two times a day (BID) | ORAL | Status: DC

## 2016-01-17 MED ORDER — DEXTROSE 5 % IV SOLN
1.5000 g | Freq: Two times a day (BID) | INTRAVENOUS | Status: DC
  Administered 2016-01-17 – 2016-01-18 (×3): 1.5 g via INTRAVENOUS
  Filled 2016-01-17 (×4): qty 1.5

## 2016-01-17 MED ORDER — TRAMADOL HCL 50 MG PO TABS
50.0000 mg | ORAL_TABLET | ORAL | Status: DC | PRN
  Administered 2016-01-18 – 2016-01-19 (×2): 100 mg via ORAL
  Filled 2016-01-17 (×2): qty 2

## 2016-01-17 MED ORDER — SODIUM CHLORIDE 0.9 % IV SOLN: INTRAVENOUS | Status: DC

## 2016-01-17 MED ORDER — ALBUMIN HUMAN 5 % IV SOLN: 250.0000 mL | INTRAVENOUS | Status: AC | PRN

## 2016-01-17 MED ORDER — POTASSIUM CHLORIDE 10 MEQ/50ML IV SOLN: 10.0000 meq | INTRAVENOUS | Status: AC

## 2016-01-17 MED ORDER — ASPIRIN EC 325 MG PO TBEC
325.0000 mg | DELAYED_RELEASE_TABLET | Freq: Every day | ORAL | Status: DC
  Administered 2016-01-18: 325 mg via ORAL
  Filled 2016-01-17: qty 1

## 2016-01-17 MED ORDER — BISACODYL 10 MG RE SUPP: 10.0000 mg | Freq: Every day | RECTAL | Status: DC

## 2016-01-17 MED ORDER — DEXMEDETOMIDINE HCL IN NACL 200 MCG/50ML IV SOLN
0.0000 ug/kg/h | INTRAVENOUS | Status: DC
  Filled 2016-01-17: qty 50

## 2016-01-17 MED ORDER — BISACODYL 5 MG PO TBEC
10.0000 mg | DELAYED_RELEASE_TABLET | Freq: Every day | ORAL | Status: DC
Start: 2016-01-18 — End: 2016-01-19
  Administered 2016-01-18: 10 mg via ORAL
  Filled 2016-01-17: qty 2

## 2016-01-17 MED ORDER — FENTANYL CITRATE (PF) 250 MCG/5ML IJ SOLN
INTRAMUSCULAR | Status: DC | PRN
  Administered 2016-01-17 (×3): 100 ug via INTRAVENOUS
  Administered 2016-01-17: 150 ug via INTRAVENOUS
  Administered 2016-01-17: 500 ug via INTRAVENOUS
  Administered 2016-01-17: 150 ug via INTRAVENOUS
  Administered 2016-01-17 (×3): 100 ug via INTRAVENOUS
  Administered 2016-01-17: 150 ug via INTRAVENOUS

## 2016-01-17 MED ORDER — PLASMA-LYTE 148 IV SOLN
INTRAVENOUS | Status: DC | PRN
  Administered 2016-01-17: 500 mL via INTRAVASCULAR

## 2016-01-17 MED ORDER — MAGNESIUM SULFATE 4 GM/100ML IV SOLN
4.0000 g | Freq: Once | INTRAVENOUS | Status: AC
  Administered 2016-01-17: 4 g via INTRAVENOUS
  Filled 2016-01-17: qty 100

## 2016-01-17 MED ORDER — ORAL CARE MOUTH RINSE
15.0000 mL | OROMUCOSAL | Status: DC
  Administered 2016-01-17: 15 mL via OROMUCOSAL

## 2016-01-17 MED ORDER — OXYCODONE HCL 5 MG PO TABS
5.0000 mg | ORAL_TABLET | ORAL | Status: DC | PRN
  Administered 2016-01-18 (×2): 5 mg via ORAL
  Administered 2016-01-18 – 2016-01-19 (×5): 10 mg via ORAL
  Filled 2016-01-17: qty 2
  Filled 2016-01-17: qty 1
  Filled 2016-01-17 (×5): qty 2

## 2016-01-17 MED ORDER — SODIUM CHLORIDE 0.9 % IV SOLN
INTRAVENOUS | Status: DC
  Administered 2016-01-17: 1.5 [IU]/h via INTRAVENOUS
  Filled 2016-01-17: qty 2.5

## 2016-01-17 MED ORDER — HEPARIN SODIUM (PORCINE) 1000 UNIT/ML IJ SOLN
INTRAMUSCULAR | Status: DC | PRN
  Administered 2016-01-17: 30000 [IU] via INTRAVENOUS

## 2016-01-17 MED ORDER — LACTATED RINGERS IV SOLN
INTRAVENOUS | Status: DC | PRN
  Administered 2016-01-17 (×2): via INTRAVENOUS

## 2016-01-17 MED ORDER — MIDAZOLAM HCL 2 MG/2ML IJ SOLN
INTRAMUSCULAR | Status: AC
  Administered 2016-01-17: 2 mg
  Filled 2016-01-17: qty 2

## 2016-01-17 MED ORDER — MIDAZOLAM HCL 10 MG/2ML IJ SOLN
INTRAMUSCULAR | Status: AC
Start: 2016-01-17 — End: 2016-01-17
  Filled 2016-01-17: qty 2

## 2016-01-17 MED ORDER — MORPHINE SULFATE (PF) 2 MG/ML IV SOLN
1.0000 mg | INTRAVENOUS | Status: AC | PRN
  Administered 2016-01-17 (×2): 2 mg via INTRAVENOUS
  Administered 2016-01-17: 1 mg via INTRAVENOUS
  Filled 2016-01-17: qty 1

## 2016-01-17 MED ORDER — NITROGLYCERIN IN D5W 200-5 MCG/ML-% IV SOLN: 0.0000 ug/min | INTRAVENOUS | Status: DC

## 2016-01-17 MED ORDER — SODIUM CHLORIDE 0.45 % IV SOLN: INTRAVENOUS | Status: DC | PRN

## 2016-01-17 MED ORDER — ALBUMIN HUMAN 5 % IV SOLN
INTRAVENOUS | Status: DC | PRN
  Administered 2016-01-17: 19:00:00 via INTRAVENOUS

## 2016-01-17 MED ORDER — LACTATED RINGERS IV SOLN
INTRAVENOUS | Status: DC | PRN
  Administered 2016-01-17 (×2): via INTRAVENOUS

## 2016-01-17 MED ORDER — MIDAZOLAM HCL 5 MG/5ML IJ SOLN
INTRAMUSCULAR | Status: DC | PRN
  Administered 2016-01-17: 4 mg via INTRAVENOUS
  Administered 2016-01-17: 3 mg via INTRAVENOUS

## 2016-01-17 MED ORDER — 0.9 % SODIUM CHLORIDE (POUR BTL) OPTIME
TOPICAL | Status: DC | PRN
  Administered 2016-01-17: 6000 mL

## 2016-01-17 MED ORDER — ROCURONIUM BROMIDE 10 MG/ML (PF) SYRINGE
PREFILLED_SYRINGE | INTRAVENOUS | Status: DC | PRN
  Administered 2016-01-17: 100 mg via INTRAVENOUS
  Administered 2016-01-17 (×2): 50 mg via INTRAVENOUS

## 2016-01-17 MED ORDER — PANTOPRAZOLE SODIUM 40 MG PO TBEC: 40.0000 mg | DELAYED_RELEASE_TABLET | Freq: Every day | ORAL | Status: DC

## 2016-01-17 MED ORDER — MORPHINE SULFATE (PF) 2 MG/ML IV SOLN
2.0000 mg | INTRAVENOUS | Status: DC | PRN
  Administered 2016-01-18: 4 mg via INTRAVENOUS
  Administered 2016-01-18 (×4): 2 mg via INTRAVENOUS
  Filled 2016-01-17 (×2): qty 1
  Filled 2016-01-17: qty 2
  Filled 2016-01-17 (×3): qty 1

## 2016-01-17 MED ORDER — ORAL CARE MOUTH RINSE
15.0000 mL | Freq: Two times a day (BID) | OROMUCOSAL | Status: DC
  Administered 2016-01-18 – 2016-01-21 (×5): 15 mL via OROMUCOSAL

## 2016-01-17 MED ORDER — SODIUM CHLORIDE 0.9% FLUSH: 3.0000 mL | INTRAVENOUS | Status: DC | PRN

## 2016-01-17 MED ORDER — ONDANSETRON HCL 4 MG/2ML IJ SOLN: 4.0000 mg | Freq: Four times a day (QID) | INTRAMUSCULAR | Status: DC | PRN

## 2016-01-17 MED ORDER — SODIUM CHLORIDE 0.9 % IV SOLN
INTRAVENOUS | Status: DC | PRN
  Administered 2016-01-17: 19:00:00 via INTRAVENOUS

## 2016-01-17 MED ORDER — DOPAMINE-DEXTROSE 1.6-5 MG/ML-% IV SOLN
INTRAVENOUS | Status: DC | PRN
  Administered 2016-01-17: 3 ug/kg/min via INTRAVENOUS

## 2016-01-17 MED ORDER — ACETAMINOPHEN 160 MG/5ML PO SOLN: 650.0000 mg | Freq: Once | ORAL | Status: AC

## 2016-01-17 MED ORDER — ARTIFICIAL TEARS OP OINT
TOPICAL_OINTMENT | OPHTHALMIC | Status: DC | PRN
  Administered 2016-01-17: 1 via OPHTHALMIC

## 2016-01-17 MED ORDER — CHLORHEXIDINE GLUCONATE 0.12% ORAL RINSE (MEDLINE KIT): 15.0000 mL | Freq: Two times a day (BID) | OROMUCOSAL | Status: DC

## 2016-01-17 MED ORDER — ACETAMINOPHEN 160 MG/5ML PO SOLN
1000.0000 mg | Freq: Four times a day (QID) | ORAL | Status: DC
  Administered 2016-01-17: 1000 mg
  Filled 2016-01-17: qty 40.6

## 2016-01-17 MED ORDER — MIDAZOLAM HCL 2 MG/2ML IJ SOLN
2.0000 mg | INTRAMUSCULAR | Status: DC | PRN
  Filled 2016-01-17: qty 2

## 2016-01-17 MED ORDER — FENTANYL CITRATE (PF) 250 MCG/5ML IJ SOLN
INTRAMUSCULAR | Status: AC
  Filled 2016-01-17: qty 10

## 2016-01-17 MED ORDER — FENTANYL CITRATE (PF) 100 MCG/2ML IJ SOLN
INTRAMUSCULAR | Status: AC
  Administered 2016-01-17: 100 ug
  Filled 2016-01-17: qty 2

## 2016-01-17 MED ORDER — ASPIRIN 81 MG PO CHEW: 324.0000 mg | CHEWABLE_TABLET | Freq: Every day | ORAL | Status: DC

## 2016-01-17 MED ORDER — PROTAMINE SULFATE 10 MG/ML IV SOLN
INTRAVENOUS | Status: DC | PRN
  Administered 2016-01-17: 270 mg via INTRAVENOUS
  Administered 2016-01-17: 10 mg via INTRAVENOUS

## 2016-01-17 MED ORDER — SODIUM CHLORIDE 0.9 % IV SOLN: 250.0000 mL | INTRAVENOUS | Status: DC

## 2016-01-17 MED ORDER — FAMOTIDINE IN NACL 20-0.9 MG/50ML-% IV SOLN
20.0000 mg | Freq: Two times a day (BID) | INTRAVENOUS | Status: AC
  Administered 2016-01-17 – 2016-01-18 (×2): 20 mg via INTRAVENOUS
  Filled 2016-01-17: qty 50

## 2016-01-17 MED ORDER — HEPARIN SODIUM (PORCINE) 1000 UNIT/ML IJ SOLN: INTRAMUSCULAR | Status: DC | PRN

## 2016-01-17 MED ORDER — FENTANYL CITRATE (PF) 250 MCG/5ML IJ SOLN
INTRAMUSCULAR | Status: AC
  Filled 2016-01-17: qty 25

## 2016-01-17 MED ORDER — THROMBIN 20000 UNITS EX SOLR
OROMUCOSAL | Status: DC | PRN
  Administered 2016-01-17 (×3): 4 mL via TOPICAL

## 2016-01-17 MED ORDER — METOPROLOL TARTRATE 5 MG/5ML IV SOLN: 2.5000 mg | INTRAVENOUS | Status: DC | PRN

## 2016-01-17 MED ORDER — ACETAMINOPHEN 500 MG PO TABS
1000.0000 mg | ORAL_TABLET | Freq: Four times a day (QID) | ORAL | Status: DC
  Administered 2016-01-18 – 2016-01-19 (×4): 1000 mg via ORAL
  Filled 2016-01-17 (×4): qty 2

## 2016-01-17 MED ORDER — PHENYLEPHRINE HCL 10 MG/ML IJ SOLN
0.0000 ug/min | INTRAVENOUS | Status: DC
  Administered 2016-01-17: 15 ug/min via INTRAVENOUS
  Filled 2016-01-17: qty 2

## 2016-01-17 MED ORDER — INSULIN REGULAR BOLUS VIA INFUSION
0.0000 [IU] | Freq: Three times a day (TID) | INTRAVENOUS | Status: DC
  Filled 2016-01-17: qty 10

## 2016-01-17 MED ORDER — THROMBIN 20000 UNITS EX SOLR
CUTANEOUS | Status: AC
  Filled 2016-01-17: qty 20000

## 2016-01-17 MED ORDER — HEMOSTATIC AGENTS (NO CHARGE) OPTIME
TOPICAL | Status: DC | PRN
  Administered 2016-01-17: 1 via TOPICAL

## 2016-01-17 MED ORDER — PROPOFOL 10 MG/ML IV BOLUS
INTRAVENOUS | Status: DC | PRN
  Administered 2016-01-17: 50 mg via INTRAVENOUS

## 2016-01-17 MED ORDER — CHLORHEXIDINE GLUCONATE 0.12 % MT SOLN
15.0000 mL | OROMUCOSAL | Status: AC
  Administered 2016-01-17: 15 mL via OROMUCOSAL

## 2016-01-17 MED ORDER — LACTATED RINGERS IV SOLN: 500.0000 mL | Freq: Once | INTRAVENOUS | Status: DC | PRN

## 2016-01-17 MED ORDER — SODIUM CHLORIDE 0.9% FLUSH
3.0000 mL | Freq: Two times a day (BID) | INTRAVENOUS | Status: DC
  Administered 2016-01-18 – 2016-01-19 (×2): 3 mL via INTRAVENOUS

## 2016-01-17 MED ORDER — PROPOFOL 10 MG/ML IV BOLUS
INTRAVENOUS | Status: AC
  Filled 2016-01-17: qty 20

## 2016-01-17 MED ORDER — VANCOMYCIN HCL IN DEXTROSE 1-5 GM/200ML-% IV SOLN
1000.0000 mg | Freq: Once | INTRAVENOUS | Status: AC
  Administered 2016-01-18: 1000 mg via INTRAVENOUS
  Filled 2016-01-17 (×2): qty 200

## 2016-01-17 MED ORDER — ACETAMINOPHEN 650 MG RE SUPP
650.0000 mg | Freq: Once | RECTAL | Status: AC
  Administered 2016-01-17: 650 mg via RECTAL

## 2016-01-17 MED ORDER — THROMBIN 20000 UNITS EX SOLR
CUTANEOUS | Status: DC | PRN
  Administered 2016-01-17: 20000 [IU] via TOPICAL

## 2016-01-17 MED ORDER — DOCUSATE SODIUM 100 MG PO CAPS
200.0000 mg | ORAL_CAPSULE | Freq: Every day | ORAL | Status: DC
  Administered 2016-01-18: 200 mg via ORAL
  Filled 2016-01-17: qty 2

## 2016-01-17 MED ORDER — DOPAMINE-DEXTROSE 3.2-5 MG/ML-% IV SOLN
3.0000 ug/kg/min | INTRAVENOUS | Status: DC
  Administered 2016-01-17: 3 ug/kg/min via INTRAVENOUS

## 2016-01-17 SURGICAL SUPPLY — 109 items
ADH SKN CLS APL DERMABOND .7 (GAUZE/BANDAGES/DRESSINGS) ×2
BAG DECANTER FOR FLEXI CONT (MISCELLANEOUS) ×3 IMPLANT
BANDAGE ACE 4X5 VEL STRL LF (GAUZE/BANDAGES/DRESSINGS) ×3 IMPLANT
BANDAGE ACE 6X5 VEL STRL LF (GAUZE/BANDAGES/DRESSINGS) ×3 IMPLANT
BASKET HEART (ORDER IN 25'S) (MISCELLANEOUS) ×1
BASKET HEART (ORDER IN 25S) (MISCELLANEOUS) ×2 IMPLANT
BLADE STERNUM SYSTEM 6 (BLADE) ×3 IMPLANT
BLADE SURG 11 STRL SS (BLADE) ×1 IMPLANT
BNDG GAUZE ELAST 4 BULKY (GAUZE/BANDAGES/DRESSINGS) ×3 IMPLANT
CANISTER SUCTION 2500CC (MISCELLANEOUS) ×3 IMPLANT
CATH ROBINSON RED A/P 18FR (CATHETERS) ×6 IMPLANT
CATH THORACIC 28FR (CATHETERS) ×4 IMPLANT
CATH THORACIC 36FR (CATHETERS) ×3 IMPLANT
CATH THORACIC 36FR RT ANG (CATHETERS) ×3 IMPLANT
CLIP FOGARTY SPRING 6M (CLIP) ×1 IMPLANT
CLIP TI MEDIUM 24 (CLIP) IMPLANT
CLIP TI WIDE RED SMALL 24 (CLIP) ×3 IMPLANT
CONN 1/2X3/8X3/8 Y GISH (MISCELLANEOUS) IMPLANT
CONN Y 3/8X3/8X3/8  BEN (MISCELLANEOUS) ×1
CONN Y 3/8X3/8X3/8 BEN (MISCELLANEOUS) IMPLANT
CRADLE DONUT ADULT HEAD (MISCELLANEOUS) ×3 IMPLANT
DERMABOND ADVANCED (GAUZE/BANDAGES/DRESSINGS) ×1
DERMABOND ADVANCED .7 DNX12 (GAUZE/BANDAGES/DRESSINGS) IMPLANT
DRAPE CARDIOVASCULAR INCISE (DRAPES) ×3
DRAPE SLUSH/WARMER DISC (DRAPES) ×3 IMPLANT
DRAPE SRG 135X102X78XABS (DRAPES) ×2 IMPLANT
DRSG COVADERM 4X14 (GAUZE/BANDAGES/DRESSINGS) ×3 IMPLANT
ELECT CAUTERY BLADE 6.4 (BLADE) ×3 IMPLANT
ELECT REM PT RETURN 9FT ADLT (ELECTROSURGICAL) ×6
ELECTRODE REM PT RTRN 9FT ADLT (ELECTROSURGICAL) ×4 IMPLANT
FELT TEFLON 1X6 (MISCELLANEOUS) ×6 IMPLANT
GAUZE SPONGE 4X4 12PLY STRL (GAUZE/BANDAGES/DRESSINGS) ×6 IMPLANT
GLOVE BIO SURGEON STRL SZ 6 (GLOVE) IMPLANT
GLOVE BIO SURGEON STRL SZ 6.5 (GLOVE) IMPLANT
GLOVE BIO SURGEON STRL SZ7 (GLOVE) IMPLANT
GLOVE BIO SURGEON STRL SZ7.5 (GLOVE) IMPLANT
GLOVE BIOGEL M 6.5 STRL (GLOVE) ×4 IMPLANT
GLOVE BIOGEL M STER SZ 6 (GLOVE) ×1 IMPLANT
GLOVE BIOGEL PI IND STRL 6 (GLOVE) IMPLANT
GLOVE BIOGEL PI IND STRL 6.5 (GLOVE) IMPLANT
GLOVE BIOGEL PI IND STRL 7.0 (GLOVE) IMPLANT
GLOVE BIOGEL PI INDICATOR 6 (GLOVE)
GLOVE BIOGEL PI INDICATOR 6.5 (GLOVE) ×1
GLOVE BIOGEL PI INDICATOR 7.0 (GLOVE)
GLOVE EUDERMIC 7 POWDERFREE (GLOVE) ×6 IMPLANT
GLOVE ORTHO TXT STRL SZ7.5 (GLOVE) IMPLANT
GOWN STRL REUS W/ TWL LRG LVL3 (GOWN DISPOSABLE) ×8 IMPLANT
GOWN STRL REUS W/ TWL XL LVL3 (GOWN DISPOSABLE) ×2 IMPLANT
GOWN STRL REUS W/TWL LRG LVL3 (GOWN DISPOSABLE) ×18
GOWN STRL REUS W/TWL XL LVL3 (GOWN DISPOSABLE) ×6
HEMOSTAT POWDER SURGIFOAM 1G (HEMOSTASIS) ×9 IMPLANT
HEMOSTAT SURGICEL 2X14 (HEMOSTASIS) ×3 IMPLANT
INSERT FOGARTY 61MM (MISCELLANEOUS) IMPLANT
INSERT FOGARTY XLG (MISCELLANEOUS) IMPLANT
KIT BASIN OR (CUSTOM PROCEDURE TRAY) ×3 IMPLANT
KIT CATH CPB BARTLE (MISCELLANEOUS) ×3 IMPLANT
KIT ROOM TURNOVER OR (KITS) ×3 IMPLANT
KIT SUCTION CATH 14FR (SUCTIONS) ×3 IMPLANT
KIT VASOVIEW HEMOPRO VH 3000 (KITS) ×3 IMPLANT
NS IRRIG 1000ML POUR BTL (IV SOLUTION) ×15 IMPLANT
PACK OPEN HEART (CUSTOM PROCEDURE TRAY) ×3 IMPLANT
PAD ARMBOARD 7.5X6 YLW CONV (MISCELLANEOUS) ×6 IMPLANT
PAD ELECT DEFIB RADIOL ZOLL (MISCELLANEOUS) ×3 IMPLANT
PENCIL BUTTON HOLSTER BLD 10FT (ELECTRODE) ×3 IMPLANT
PUNCH AORTIC ROTATE 4.0MM (MISCELLANEOUS) IMPLANT
PUNCH AORTIC ROTATE 4.5MM 8IN (MISCELLANEOUS) ×3 IMPLANT
PUNCH AORTIC ROTATE 5MM 8IN (MISCELLANEOUS) IMPLANT
SET CARDIOPLEGIA MPS 5001102 (MISCELLANEOUS) ×1 IMPLANT
SLEEVE SURGEON STRL (DRAPES) ×1 IMPLANT
SPONGE INTESTINAL PEANUT (DISPOSABLE) IMPLANT
SPONGE LAP 18X18 X RAY DECT (DISPOSABLE) ×2 IMPLANT
SPONGE LAP 4X18 X RAY DECT (DISPOSABLE) ×4 IMPLANT
SUT BONE WAX W31G (SUTURE) ×3 IMPLANT
SUT MNCRL AB 4-0 PS2 18 (SUTURE) ×1 IMPLANT
SUT PROLENE 3 0 SH DA (SUTURE) IMPLANT
SUT PROLENE 3 0 SH1 36 (SUTURE) ×3 IMPLANT
SUT PROLENE 4 0 RB 1 (SUTURE)
SUT PROLENE 4 0 SH DA (SUTURE) IMPLANT
SUT PROLENE 4-0 RB1 .5 CRCL 36 (SUTURE) IMPLANT
SUT PROLENE 5 0 C 1 36 (SUTURE) IMPLANT
SUT PROLENE 6 0 C 1 30 (SUTURE) IMPLANT
SUT PROLENE 7 0 BV 1 (SUTURE) ×1 IMPLANT
SUT PROLENE 7 0 BV1 MDA (SUTURE) ×3 IMPLANT
SUT PROLENE 8 0 BV175 6 (SUTURE) ×2 IMPLANT
SUT SILK  1 MH (SUTURE) ×1
SUT SILK 1 MH (SUTURE) IMPLANT
SUT SILK 2 0 SH (SUTURE) ×1 IMPLANT
SUT STEEL STERNAL CCS#1 18IN (SUTURE) IMPLANT
SUT STEEL SZ 6 DBL 3X14 BALL (SUTURE) ×3 IMPLANT
SUT VIC AB 1 CTX 36 (SUTURE) ×6
SUT VIC AB 1 CTX36XBRD ANBCTR (SUTURE) ×4 IMPLANT
SUT VIC AB 2-0 CT1 27 (SUTURE) ×3
SUT VIC AB 2-0 CT1 TAPERPNT 27 (SUTURE) IMPLANT
SUT VIC AB 2-0 CTX 27 (SUTURE) IMPLANT
SUT VIC AB 3-0 SH 27 (SUTURE)
SUT VIC AB 3-0 SH 27X BRD (SUTURE) IMPLANT
SUT VIC AB 3-0 X1 27 (SUTURE) IMPLANT
SUT VICRYL 4-0 PS2 18IN ABS (SUTURE) IMPLANT
SUTURE E-PAK OPEN HEART (SUTURE) ×3 IMPLANT
SYSTEM SAHARA CHEST DRAIN ATS (WOUND CARE) ×3 IMPLANT
TAPE CLOTH SURG 4X10 WHT LF (GAUZE/BANDAGES/DRESSINGS) ×1 IMPLANT
TAPE PAPER 2X10 WHT MICROPORE (GAUZE/BANDAGES/DRESSINGS) ×1 IMPLANT
TOWEL OR 17X24 6PK STRL BLUE (TOWEL DISPOSABLE) ×3 IMPLANT
TOWEL OR 17X26 10 PK STRL BLUE (TOWEL DISPOSABLE) ×3 IMPLANT
TRAY FOLEY IC TEMP SENS 16FR (CATHETERS) ×3 IMPLANT
TUBE SUCT INTRACARD DLP 20F (MISCELLANEOUS) ×1 IMPLANT
TUBING INSUFFLATION (TUBING) ×3 IMPLANT
UNDERPAD 30X30 (UNDERPADS AND DIAPERS) ×3 IMPLANT
WATER STERILE IRR 1000ML POUR (IV SOLUTION) ×6 IMPLANT

## 2016-01-17 NOTE — Anesthesia Preprocedure Evaluation (Signed)
Anesthesia Evaluation  Patient identified by MRN, date of birth, ID band Patient awake    Reviewed: Allergy & Precautions, H&P , NPO status , Patient's Chart, lab work & pertinent test results  Airway Mallampati: III  TM Distance: >3 FB Neck ROM: Full    Dental no notable dental hx. (+) Teeth Intact, Dental Advisory Given   Pulmonary sleep apnea , former smoker,    Pulmonary exam normal breath sounds clear to auscultation       Cardiovascular hypertension, + CAD  negative cardio ROS   Rhythm:Regular Rate:Normal     Neuro/Psych negative neurological ROS  negative psych ROS   GI/Hepatic Neg liver ROS, GERD  Medicated and Controlled,  Endo/Other  negative endocrine ROS  Renal/GU negative Renal ROS  negative genitourinary   Musculoskeletal   Abdominal   Peds  Hematology negative hematology ROS (+)   Anesthesia Other Findings   Reproductive/Obstetrics negative OB ROS                             Anesthesia Physical Anesthesia Plan  ASA: IV  Anesthesia Plan: General   Post-op Pain Management:    Induction: Intravenous  Airway Management Planned: Oral ETT  Additional Equipment: Arterial line, CVP, PA Cath, TEE and Ultrasound Guidance Line Placement  Intra-op Plan:   Post-operative Plan: Post-operative intubation/ventilation  Informed Consent: I have reviewed the patients History and Physical, chart, labs and discussed the procedure including the risks, benefits and alternatives for the proposed anesthesia with the patient or authorized representative who has indicated his/her understanding and acceptance.   Dental advisory given  Plan Discussed with: CRNA  Anesthesia Plan Comments:         Anesthesia Quick Evaluation

## 2016-01-17 NOTE — Anesthesia Procedure Notes (Signed)
Procedure Name: Intubation Date/Time: 01/17/2016 2:05 PM Performed by: Clearnce Sorrel Pre-anesthesia Checklist: Patient identified, Emergency Drugs available, Suction available, Patient being monitored and Timeout performed Patient Re-evaluated:Patient Re-evaluated prior to inductionOxygen Delivery Method: Circle system utilized Preoxygenation: Pre-oxygenation with 100% oxygen Intubation Type: IV induction Ventilation: Mask ventilation without difficulty, Oral airway inserted - appropriate to patient size and Two handed mask ventilation required Laryngoscope Size: 4 and Glidescope Grade View: Grade I Tube type: Oral Tube size: 8.0 mm Number of attempts: 1 Airway Equipment and Method: Stylet Placement Confirmation: ETT inserted through vocal cords under direct vision,  positive ETCO2 and breath sounds checked- equal and bilateral Secured at: 23 cm Tube secured with: Tape Dental Injury: Teeth and Oropharynx as per pre-operative assessment

## 2016-01-17 NOTE — Transfer of Care (Signed)
Immediate Anesthesia Transfer of Care Note  Patient: Luke Smith  Procedure(s) Performed: Procedure(s): CORONARY ARTERY BYPASS GRAFTING (CABG) x 4 (LIMA to LAD, SVG to DIAGONAL, SVG to OM, and RIMA to RCA) with EVH from [place] and BILATERAL MAMMARY HARVEST (N/A) TRANSESOPHAGEAL ECHOCARDIOGRAM (TEE) (N/A)  Patient Location: SICU  Anesthesia Type:General  Level of Consciousness: Patient remains intubated per anesthesia plan  Airway & Oxygen Therapy: Patient remains intubated per anesthesia plan and Patient placed on Ventilator (see vital sign flow sheet for setting)  Post-op Assessment: Report given to RN and Post -op Vital signs reviewed and stable  Post vital signs: Reviewed and stable  Last Vitals:  Vitals:   01/17/16 0801 01/17/16 1218  BP: 125/81 (!) 133/98  Pulse: 82 100  Resp: 16   Temp: 36.7 C     Last Pain:  Vitals:   01/17/16 0801  TempSrc: Oral  PainSc:       Patients Stated Pain Goal: 0 ([id number] [phone])  Complications: No apparent anesthesia complications

## 2016-01-17 NOTE — Progress Notes (Signed)
ANTICOAGULATION CONSULT NOTE - Follow-up Consult  Pharmacy Consult for Heparin Indication: chest pain/ACS  Allergies  Allergen Reactions  . Aspirin Other (See Comments)    REACTION: acid reflux    Patient Measurements: Height: 5\' 9"  (175.3 cm) Weight: 184 lb (83.5 kg) IBW/kg (Calculated) : 70.7 Heparin Dosing Weight: 84 kg  Vital Signs: Temp: 98.5 F (36.9 C) (10/05 0610) Temp Source: Oral (10/05 0610) BP: 120/83 (10/05 0610) Pulse Rate: 85 (10/05 0610)  Labs:  Recent Labs  01/15/16 2114 01/16/16 0242 01/16/16 0245 01/16/16 0809 01/16/16 1256 01/17/16 0551  HGB 15.5  --  14.6  --   --  14.4  HCT 45.7  --  43.0  --   --  44.2  PLT 242  --  214  --   --  222  LABPROT  --   --  14.4  --   --   --   INR  --   --  1.12  --   --   --   HEPARINUNFRC  --  0.60  --   --   --  0.49  CREATININE 0.98  --   --   --   --  1.22  TROPONINI  --   --  3.37* 10.26* 7.43*  --     Estimated Creatinine Clearance (by C-G formula based on SCr of 1.22 mg/dL) Male: 60.8 mL/min Male: 69.2 mL/min   Assessment: 55 y.o. M on heparin for r/o ACS. He is now s/p cath with 3VCAD with plans for CABG on 10/6. Pharmacy consulted to dose heparin -Heparin level = 0.49 and at goal  Goal of Therapy:  Heparin level 0.3-0.7 units/ml Monitor platelets by anticoagulation protocol: Yes   Plan:  -No heparin changes needed -Heparin level daily wth CBC daily  Hildred Laser, Pharm D 01/17/2016 8:00 AM

## 2016-01-17 NOTE — Progress Notes (Signed)
Rapid Wean Protocol Begun, RT and RN at bedside to monitor. Patient complaining of some pain but tolerating well otherwise.

## 2016-01-17 NOTE — Op Note (Signed)
CARDIOVASCULAR SURGERY OPERATIVE NOTE  01/17/2016  Surgeon:  Gaye Pollack, MD  First Assistant: Lars Pinks,  PA-C   Preoperative Diagnosis:  Severe multi-vessel coronary artery disease   Postoperative Diagnosis:  Same   Procedure:  1. Median Sternotomy 2. Extracorporeal circulation 3.   Coronary artery bypass grafting x 4   Left internal mammary graft to the LAD  Right internal mammary graft to the RCA  SVG to diagonal  SVG to OM2 4.   Endoscopic vein harvest from the right leg   Anesthesia:  General Endotracheal   Clinical History/Surgical Indication:  The patient is a 55 year old gentleman with a history of hypertension, sleep apnea and remote smoking who reports a several month history of substernal chest discomfort radiation up into his throat which he thought was GERD. He underwent EGD and colonoscopy recently which were normal.He was recently in [place] and doing a lot of walking and got the pain but kept walking and it seemed to get better. He was seen by Dr. Gwenlyn Found and underwent a nuclear stress test on 12/05/2015 which showed an EF of 56% with no ischemic changes. His symptoms were not getting better and were occurring at rest and with exertion. He saw Dr. Gwenlyn Found yesterday and arrangements were made for cath on Thursday. After leaving the office he had worsening chest discomfort and diaphoresis and he called 911 last pm. His ECG initially was unremarkable but his troponin poc was 1.92. He was treated with NTG and pain meds and he improved. Cath today shows severe multivessel CAD with 99% mid RCA stenosis with TIMI 2 flow and left to right collaterals. There is a 95% mid LCX stenosis after the first large OM, 85% mid LAD and 75% apical LAD. There is a large diagonal with 70% proximal stenosis. LVEF is 45-50% with an elevated LVEDP of 23. He did not have any further pain until  tonight after eating a large dinner and he started getting the typical chest pain which he is having now. His troponin did peak at 10.26.   I have personally reviewed and interpreted his cardiac cath. This 55 year old has severe multi-vessel CAD with subtotal stenoses in the mid RCA and mid LCX as well as significant LAD and diagonal stenoses. I reviewed the cath with the patient and his wife. I discussed the operative procedure with them including alternatives, benefits and risks; including but not limited to bleeding, blood transfusion, infection, stroke, myocardial infarction, graft failure, heart block requiring a permanent pacemaker, organ dysfunction, and death.  [name] understands and agrees to proceed.   Preparation:  The patient was seen in the preoperative holding area and the correct patient, correct operation were confirmed with the patient after reviewing the medical record and catheterization. The consent was signed by me. Preoperative antibiotics were given. A pulmonary arterial line and radial arterial line were placed by the anesthesia team. The patient was taken back to the operating room and positioned supine on the operating room table. After being placed under general endotracheal anesthesia by the anesthesia team a foley catheter was placed. The neck, chest, abdomen, and both legs were prepped with betadine soap and solution and draped in the usual sterile manner. A surgical time-out was taken and the correct patient and operative procedure were confirmed with the nursing and anesthesia staff.   Cardiopulmonary Bypass:  A median sternotomy was performed. The pericardium was opened in the midline. Right ventricular function appeared normal. The ascending aorta was  of normal size and had no palpable plaque. There were no contraindications to aortic cannulation or cross-clamping. The patient was fully systemically heparinized and the ACT was maintained > 400 sec. The proximal  aortic arch was cannulated with a 20 F aortic cannula for arterial inflow. Venous cannulation was performed via the right atrial appendage using a two-staged venous cannula. An antegrade cardioplegia/vent cannula was inserted into the mid-ascending aorta. Aortic occlusion was performed with a single cross-clamp. Systemic cooling to 32 degrees Centigrade and topical cooling of the heart with iced saline were used. Hyperkalemic antegrade cold blood cardioplegia was used to induce diastolic arrest and was then given at about 20 minute intervals throughout the period of arrest to maintain myocardial temperature at or below 10 degrees centigrade. A temperature probe was inserted into the interventricular septum and an insulating pad was placed in the pericardium.   Left internal mammary artery harvest:  The left side of the sternum was retracted using the Rultract retractor. The left internal mammary artery was harvested as a pedicle graft. All side branches were clipped. It was a medium-sized vessel of good quality with excellent blood flow. It was ligated distally and divided. It was sprayed with topical papaverine solution to prevent vasospasm.  Right internal mammary artery harvest:  The right side of the sternum was retracted using the Rultract retractor. The right internal mammary artery was harvested as a pedicle graft. All side branches were clipped. It was a medium-sized vessel of good quality with excellent blood flow. It was ligated distally and divided. It was sprayed with topical papaverine solution to prevent vasospasm.   Endoscopic vein harvest:  The right greater saphenous vein was harvested endoscopically through a 2 cm incision medial to the right knee. It was harvested from the thigh. It was a medium to large -sized vein of good quality. The side branches were all ligated with 4-0 silk ties.    Coronary arteries:  The coronary arteries were examined.   LAD:  Large vessel with  diffuse distal disease. The diagonal was diseased throughout the proximal portion but the distal vessel was graftable.  LCX:  The OM2 is a large graftable vessel with no distal disease  RCA:  The RCA was located deep in the AV groove in its mid-portion where it was a large vessel with no significant disease.   Grafts:  1. LIMA to the LAD: 2.0 mm. It was sewn end to side using 8-0 prolene continuous suture. 2. RIMA to the RCA:  3.0 mm. It was sewn end to side using 7-0 prolene continuous suture. 3. SVG to diagonal:  1.6 mm. It was sewn end to side using 7-0 prolene continuous suture. 4. SVG to OM2:  1.75 mm. It was sewn end to side using 7-0 prolene continuous suture.  The proximal vein graft anastomoses were performed to the mid-ascending aorta using continuous 6-0 prolene suture. Graft markers were placed around the proximal anastomoses.   Completion:  The patient was rewarmed to 37 degrees Centigrade. The clamp was removed from the LIMA and RIMA pedicle and there was rapid warming of the septum and return of ventricular fibrillation. The crossclamp was removed with a time of 89 minutes. There was spontaneous return of sinus rhythm. The distal and proximal anastomoses were checked for hemostasis. The position of the grafts was satisfactory. Two temporary epicardial pacing wires were placed on the right atrium and two on the right ventricle. The patient was weaned from CPB without difficulty on no inotropes.  CPB time was 111 minutes. Cardiac output was 5 LPM. Heparin was fully reversed with protamine and the aortic and venous cannulas removed. Hemostasis was achieved. Mediastinal and bilateral pleural drainage tubes were placed. The sternum was closed with double #6 stainless steel wires. The fascia was closed with continuous # 1 vicryl suture. The subcutaneous tissue was closed with 2-0 vicryl continuous suture. The skin was closed with 3-0 vicryl subcuticular suture. All sponge, needle, and  instrument counts were reported correct at the end of the case. Dry sterile dressings were placed over the incisions and around the chest tubes which were connected to pleurevac suction. The patient was then transported to the surgical intensive care unit in critical but stable condition.

## 2016-01-17 NOTE — Progress Notes (Signed)
Pre-op Cardiac Surgery  Carotid Findings:  There is no obvious evidence of hemodynamically significant internal carotid artery stenosis bilaterally. Vertebral arteries are patent with antegrade flow.  Upper Extremity Right Left  Brachial Pressures 126-Triphasic Triphasic  Radial Waveforms Triphasic Triphasic  Ulnar Waveforms Triphasic Triphasic  Palmar Arch (Allen's Test) Signal obliterates with radial compression, is unaffected with ulnar compression. Within normal limits   Lower  Extremity Right Left  Dorsalis Pedis Triphasic Triphasic  Posterior Tibial Triphasic Triphasic    01/17/2016 10:04 AM Maudry Mayhew, BS, RVT, RDCS, RDMS

## 2016-01-17 NOTE — Anesthesia Postprocedure Evaluation (Signed)
Anesthesia Post Note  Patient: Luke Smith  Procedure(s) Performed: Procedure(s) (LRB): CORONARY ARTERY BYPASS GRAFTING (CABG) x 4 (LIMA to LAD, SVG to DIAGONAL, SVG to OM, and RIMA to RCA) with EVH from [place] and BILATERAL MAMMARY HARVEST (N/A) TRANSESOPHAGEAL ECHOCARDIOGRAM (TEE) (N/A)  Patient location during evaluation: PACU Anesthesia Type: General Level of consciousness: awake and alert Pain management: pain level controlled Vital Signs Assessment: post-procedure vital signs reviewed and stable Respiratory status: spontaneous breathing, nonlabored ventilation, respiratory function stable, patient remains intubated per anesthesia plan and patient on ventilator - see flowsheet for VS Cardiovascular status: blood pressure returned to baseline and stable Postop Assessment: no signs of nausea or vomiting Anesthetic complications: no    Last Vitals:  Vitals:   01/17/16 0801 01/17/16 1218  BP: 125/81 (!) 133/98  Pulse: 82 100  Resp: 16   Temp: 36.7 C     Last Pain:  Vitals:   01/17/16 0801  TempSrc: Oral  PainSc:                  Chessica Audia A

## 2016-01-17 NOTE — Progress Notes (Signed)
Patient Name: Luke Smith Date of Encounter: 01/17/2016   SUBJECTIVE  No chest pain or sob.   CURRENT MEDS . aspirin EC  81 mg Oral Daily  . atorvastatin  80 mg Oral q1800  . bisacodyl  5 mg Oral Once  . cefUROXime (ZINACEF)  IV  1.5 g Intravenous To OR  . cefUROXime (ZINACEF)  IV  750 mg Intravenous To OR  . chlorhexidine  15 mL Mouth/Throat Once  . Chlorhexidine Gluconate Cloth  6 each Topical Once  . dexmedetomidine  0.1-0.7 mcg/kg/hr Intravenous To OR  . diazepam  5 mg Oral Once  . DOPamine  0-10 mcg/kg/min Intravenous To OR  . epinephrine  0-10 mcg/min Intravenous To OR  . heparin-papaverine-plasmalyte irrigation   Irrigation To OR  . heparin 30,000 units/NS 1000 mL solution for CELLSAVER   Other To OR  . insulin (NOVOLIN-R) infusion   Intravenous To OR  . magnesium sulfate  40 mEq Other To OR  . metoprolol tartrate  12.5 mg Oral Once  . metoprolol tartrate  25 mg Oral BID  . nitroGLYCERIN  2-200 mcg/min Intravenous To OR  . pantoprazole  40 mg Oral Daily  . phenylephrine (NEO-SYNEPHRINE) Adult infusion  30-200 mcg/min Intravenous To OR  . potassium chloride  80 mEq Other To OR  . sodium chloride flush  3 mL Intravenous Q12H  . tranexamic acid (CYKLOKAPRON) infusion ([facility])  1.5 mg/kg/hr Intravenous To OR  . tranexamic acid  15 mg/kg Intravenous To OR  . tranexamic acid  2 mg/kg Intracatheter To OR  . vancomycin  1,250 mg Intravenous To OR    OBJECTIVE  Vitals:   01/16/16 2045 01/17/16 0019 01/17/16 0610 01/17/16 0801  BP: 128/80 119/78 120/83 125/81  Pulse: (!) 104 92 85 82  Resp: 18 15 18 16   Temp: 98.6 F (37 C) 98.6 F (37 C) 98.5 F (36.9 C) 98.1 F (36.7 C)  TempSrc: Oral Oral Oral Oral  SpO2: 98% 100% 98% 97%  Weight:   184 lb (83.5 kg)   Height:   5\' 9"  (1.753 m)     Intake/Output Summary (Last 24 hours) at 01/17/16 0911 Last data filed at 01/17/16 0300  Gross per 24 hour  Intake          1475.59 ml  Output                0 ml  Net           1475.59 ml   Filed Weights   01/16/16 0009 01/16/16 0359 01/17/16 0610  Weight: 188 lb 12.8 oz (85.6 kg) 188 lb 12.8 oz (85.6 kg) 184 lb (83.5 kg)    PHYSICAL EXAM  General: Pleasant, NAD. Neuro: Alert and oriented X 3. Moves all extremities spontaneously. Psych: Normal affect. HEENT:  Normal  Neck: Supple without bruits or JVD. Lungs:  Resp regular and unlabored, CTA. Heart: RRR no s3, s4, or murmurs. Abdomen: Soft, non-tender, non-distended, BS + x 4.  Extremities: No clubbing, cyanosis or edema. DP/PT/Radials 2+ and equal bilaterally. R radial cath site without hematoma or bruise.   Accessory Clinical Findings  CBC  Recent Labs  01/16/16 0245 01/17/16 0551  WBC 10.3 9.5  HGB 14.6 14.4  HCT 43.0 44.2  MCV 91.3 93.2  PLT 214 [id number]   Basic Metabolic Panel  Recent Labs  01/15/16 2114 01/17/16 0551  NA 134* 138  K 3.8 4.0  CL 102 100*  CO2 22 27  GLUCOSE 169* 127*  BUN 15 12  CREATININE 0.98 1.22  CALCIUM 9.8 9.3   Liver Function Tests  Recent Labs  01/15/16 2114  AST 51*  ALT 68*  ALKPHOS 62  BILITOT 0.6  PROT 7.0  ALBUMIN 4.2    Recent Labs  01/15/16 2114  LIPASE 34   Cardiac Enzymes  Recent Labs  01/16/16 0245 01/16/16 0809 01/16/16 1256  TROPONINI 3.37* 10.26* 7.43*   BNP Invalid input(s): POCBNP D-Dimer  Recent Labs  01/15/16 2055  DDIMER 0.28   Hemoglobin A1C No results for input(s): HGBA1C in the last 72 hours. Fasting Lipid Panel  Recent Labs  01/16/16 0248  CHOL 168  HDL 41  LDLCALC 96  TRIG 156*  CHOLHDL 4.1   Thyroid Function Tests No results for input(s): TSH, T4TOTAL, T3FREE, THYROIDAB in the last 72 hours.  Invalid input(s): FREET3  TELE  Sinus rhythm  Radiology/Studies  Dg Chest 2 View  Result Date: 01/15/2016 CLINICAL DATA:  55 year old male with chest pain. EXAM: CHEST  2 VIEW COMPARISON:  None. FINDINGS: The heart size and mediastinal contours are within normal limits. Both lungs are clear. The  visualized skeletal structures are unremarkable. IMPRESSION: No active cardiopulmonary disease. Electronically Signed   By: Anner Crete M.D.   On: 01/15/2016 22:17    Belva Crome, MD (Primary)    Procedures   Left Heart Cath and Coronary Angiography  Conclusion    Severe three-vessel coronary disease with 99% mid RCA serving as a culprit for presentation and non-ST elevation MI. RCA has TIMI grade 2 flow and has left-to-right collaterals.  85% segmental mid LAD stenosis and apical 75% stenosis. The large diagonal contains segmental 60-70% narrowing.  90-95% mid circumflex after the first large obtuse marginal branch origin.  Inferobasal akinesis with mid inferior wall hypokinesis. EF 45-50%. Elevated LVEDP.  RECOMMENDATIONS:   After discussion with the patient, TCTS consult for surgical revascularization was recommended. TCT S was notified. Potential Friday surgery.  Start beta blocker therapy.  Resume IV heparin  Stepdown status.  Prolonged pain prior to surgery unresolved by medical therapy should potentially be treated with PCI of the right coronary     ASSESSMENT AND PLAN Active Problems:   Abnormal stress test   History of smoking   NSTEMI (non-ST elevated myocardial infarction) [facility])    Plan: Cath showed severe 3V disease as above.  For cath today afternoon or tomorrow. He is chest pain free. Troponin trending down. Continue ASA, statin, and BB.   Jarrett Soho PA-C Pager [phone]    Patient seen and examined. Agree with assessment and plan. No recurrent chest pain today, last episode yesterday with dinner.  Peak troponin 10.26 c/w NSTEMI. Will obtain repeat ECG today pre-op to see if evolutionary changes. For CABG this afternoon.   Troy Sine, MD, [facility] 01/17/2016 10:31 AM

## 2016-01-17 NOTE — Procedures (Signed)
Extubation Procedure Note  Patient Details:   Name: Luke Smith DOB: 1960/11/19 MRN: [id number]   Airway Documentation:     Evaluation  O2 sats: stable throughout Complications: No apparent complications Patient did tolerate procedure well. Bilateral Breath Sounds: Clear, Diminished   Yes  Jori Moll 01/17/2016, 11:42 PM   Patient performed a NIF of -20 and FVC of .5L  (Dr. Roxan Hockey aware and said proceed to extubate) Cuff leak present prior to extubation and patient tolerated well. Patient extubated to 6L nasal cannula and RT will continue to monitor.

## 2016-01-17 NOTE — Brief Op Note (Signed)
01/15/2016 - 01/17/2016  6:03 PM  PATIENT:  Luke Smith  54 y.o. male  PRE-OPERATIVE DIAGNOSIS:  1. S/p NSTEMI 2. Severe Multi Vessel Coronary Disease  POST-OPERATIVE DIAGNOSIS:  1. S/p NSTEMI 2. Severe Multi Vessel Coronary Disease   PROCEDURE:TRANSESOPHAGEAL ECHOCARDIOGRAM (TEE),  MEDIAN STERNOTOMY for CORONARY ARTERY BYPASS GRAFTING (CABG) x 4 (LIMA to LAD, SVG to DIAGONAL, SVG to OM, and RIMA to RCA) with EVH from [place] and BILATERAL MAMMARY HARVEST  SURGEON:  Surgeon(s) and Role:    * Gaye Pollack, MD - Primary  PHYSICIAN ASSISTANT: Lars Pinks PA-C  ANESTHESIA:   general  EBL:  Total I/O In: 2000 [I.V.:2000] Out: 500 [Urine:500]   DRAINS: Chest tubes placed in the mediastinal and pleural spaces   COUNTS CORRECT:  YES  DICTATION: .Dragon Dictation  PLAN OF CARE: Admit to inpatient   PATIENT DISPOSITION:  ICU - intubated and hemodynamically stable.   Delay start of Pharmacological VTE agent (>24hrs) due to surgical blood loss or risk of bleeding: yes  BASELINE WEIGHT: 83.5 kg

## 2016-01-17 NOTE — Anesthesia Procedure Notes (Addendum)
Central Venous Catheter Insertion Performed by: anesthesiologist 01/17/2016 1:30 PM Patient location: Pre-op. Preanesthetic checklist: patient identified, IV checked, site marked, risks and benefits discussed, surgical consent, monitors and equipment checked, pre-op evaluation, timeout performed and anesthesia consent Position: Trendelenburg Lidocaine 1% used for infiltration Landmarks identified and Seldinger technique used Catheter size: 9 Fr Central line was placed.MAC introducer Swan type and PA catheter depth:thermodilution and 50PA Cath depth:50 Procedure performed using ultrasound guided technique. Attempts: 1 Following insertion, line sutured and dressing applied. Post procedure assessment: blood return through all ports, free fluid flow and no air. Patient tolerated the procedure well with no immediate complications.

## 2016-01-17 NOTE — Progress Notes (Signed)
  Echocardiogram Echocardiogram Transesophageal has been performed.  Tresa Res 01/17/2016, 3:21 PM

## 2016-01-17 NOTE — Progress Notes (Signed)
Dr. Roxan Hockey notified about borderline gas and NIF .5 L. Verbal order to extubate. Will continue to monitor patient.

## 2016-01-17 NOTE — Progress Notes (Signed)
Discussed with pt and family sternal precautions, mobility, IS and d/c planning. Gave [facility] booklet, care guide, and video to watch. Voiced understanding. Wife will be with pt at d/c. [id number] Yves Dill CES, ACSM 11:19 AM 01/17/2016

## 2016-01-18 ENCOUNTER — Inpatient Hospital Stay (HOSPITAL_COMMUNITY): Payer: BLUE CROSS/BLUE SHIELD

## 2016-01-18 ENCOUNTER — Encounter (HOSPITAL_COMMUNITY)
Admission: EM | Disposition: A | Discharge status: Home / Self Care | Payer: Self-pay | Source: Home / Self Care | Attending: Surgery

## 2016-01-18 ENCOUNTER — Encounter (HOSPITAL_COMMUNITY): Payer: Self-pay | Admitting: Surgery

## 2016-01-18 LAB — POCT I-STAT, CHEM 8
BUN: 15 mg/dL (ref 6–20)
CALCIUM ION: 1.13 mmol/L — AB (ref 1.15–1.40)
CHLORIDE: 101 mmol/L (ref 101–111)
CREATININE: 1 mg/dL (ref 0.61–1.24)
Glucose, Bld: 144 mg/dL — ABNORMAL HIGH (ref 65–99)
HEMATOCRIT: 35 % — AB (ref 39.0–52.0)
Hemoglobin: 11.9 g/dL — ABNORMAL LOW (ref 13.0–17.0)
Potassium: 4.2 mmol/L (ref 3.5–5.1)
SODIUM: 136 mmol/L (ref 135–145)
TCO2: 26 mmol/L (ref 0–100)

## 2016-01-18 LAB — BASIC METABOLIC PANEL
Anion gap: 7 (ref 5–15)
BUN: 11 mg/dL (ref 6–20)
CALCIUM: 8 mg/dL — AB (ref 8.9–10.3)
CO2: 25 mmol/L (ref 22–32)
CREATININE: 0.97 mg/dL (ref 0.61–1.24)
Chloride: 104 mmol/L (ref 101–111)
GFR calc non Af Amer: >60 mL/min (ref >60)
Glucose, Bld: 143 mg/dL — ABNORMAL HIGH (ref 65–99)
Potassium: 4.1 mmol/L (ref 3.5–5.1)
Sodium: 136 mmol/L (ref 135–145)

## 2016-01-18 LAB — CBC
HEMATOCRIT: 33.8 % — AB (ref 39.0–52.0)
HEMATOCRIT: 36.1 % — AB (ref 39.0–52.0)
Hemoglobin: 11.1 g/dL — ABNORMAL LOW (ref 13.0–17.0)
Hemoglobin: 11.6 g/dL — ABNORMAL LOW (ref 13.0–17.0)
MCH: 29.9 pg (ref 26.0–34.0)
MCH: 30 pg (ref 26.0–34.0)
MCHC: 32.8 g/dL (ref 30.0–36.0)
MCHC: 32.1 g/dL (ref 30.0–36.0)
MCV: 91.1 fL (ref 78.0–100.0)
MCV: 93.3 fL (ref 78.0–100.0)
PLATELETS: 176 10*3/uL (ref 150–400)
Platelets: 209 10*3/uL (ref 150–400)
RBC: 3.71 MIL/uL — ABNORMAL LOW (ref 4.22–5.81)
RBC: 3.87 MIL/uL — ABNORMAL LOW (ref 4.22–5.81)
RDW: 12.6 % (ref 11.5–15.5)
RDW: 13 % (ref 11.5–15.5)
WBC: 13.2 10*3/uL — ABNORMAL HIGH (ref 4.0–10.5)
WBC: 16.1 10*3/uL — ABNORMAL HIGH (ref 4.0–10.5)

## 2016-01-18 LAB — POCT I-STAT 3, ART BLOOD GAS (G3+)
ACID-BASE DEFICIT: 2 mmol/L (ref 0.0–2.0)
BICARBONATE: 23.6 mmol/L (ref 20.0–28.0)
O2 Saturation: 94 %
PH ART: 7.348 — AB (ref 7.350–7.450)
TCO2: 25 mmol/L (ref 0–100)
pCO2 arterial: 43.2 mmHg (ref 32.0–48.0)
pO2, Arterial: 77 mmHg — ABNORMAL LOW (ref 83.0–108.0)

## 2016-01-18 LAB — GLUCOSE, CAPILLARY
GLUCOSE-CAPILLARY: 104 mg/dL — AB (ref 65–99)
GLUCOSE-CAPILLARY: 108 mg/dL — AB (ref 65–99)
GLUCOSE-CAPILLARY: 134 mg/dL — AB (ref 65–99)
GLUCOSE-CAPILLARY: 141 mg/dL — AB (ref 65–99)
GLUCOSE-CAPILLARY: 143 mg/dL — AB (ref 65–99)
GLUCOSE-CAPILLARY: 75 mg/dL (ref 65–99)
Glucose-Capillary: 128 mg/dL — ABNORMAL HIGH (ref 65–99)
Glucose-Capillary: 130 mg/dL — ABNORMAL HIGH (ref 65–99)
Glucose-Capillary: 149 mg/dL — ABNORMAL HIGH (ref 65–99)
Glucose-Capillary: 99 mg/dL (ref 65–99)
Glucose-Capillary: 121 mg/dL — ABNORMAL HIGH (ref 65–99)
Glucose-Capillary: 121 mg/dL — ABNORMAL HIGH (ref 65–99)
Glucose-Capillary: 112 mg/dL — ABNORMAL HIGH (ref 65–99)
Glucose-Capillary: 138 mg/dL — ABNORMAL HIGH (ref 65–99)

## 2016-01-18 LAB — HEMOGLOBIN A1C
Hgb A1c MFr Bld: 5.5 % (ref 4.8–5.6)
Mean Plasma Glucose: 111 mg/dL

## 2016-01-18 LAB — HEMOGLOBIN AND HEMATOCRIT, BLOOD
HEMATOCRIT: 26.3 % — AB (ref 39.0–52.0)
Hemoglobin: 8.7 g/dL — ABNORMAL LOW (ref 13.0–17.0)

## 2016-01-18 LAB — MAGNESIUM
MAGNESIUM: 2.2 mg/dL (ref 1.7–2.4)
Magnesium: 2.6 mg/dL — ABNORMAL HIGH (ref 1.7–2.4)

## 2016-01-18 LAB — CREATININE, SERUM
Creatinine, Ser: 1.12 mg/dL (ref 0.61–1.24)
GFR calc Af Amer: >60 mL/min (ref >60)
GFR calc non Af Amer: >60 mL/min (ref >60)

## 2016-01-18 SURGERY — CORONARY ARTERY BYPASS GRAFTING (CABG)
Anesthesia: General | Site: Chest

## 2016-01-18 MED ORDER — ENOXAPARIN SODIUM 40 MG/0.4ML ~~LOC~~ SOLN
40.0000 mg | Freq: Every day | SUBCUTANEOUS | Status: DC
  Administered 2016-01-18 – 2016-01-21 (×4): 40 mg via SUBCUTANEOUS
  Filled 2016-01-18 (×4): qty 0.4

## 2016-01-18 MED ORDER — INSULIN ASPART 100 UNIT/ML ~~LOC~~ SOLN
0.0000 [IU] | SUBCUTANEOUS | Status: DC
  Administered 2016-01-18 – 2016-01-19 (×3): 2 [IU] via SUBCUTANEOUS

## 2016-01-18 MED ORDER — POTASSIUM CHLORIDE CRYS ER 20 MEQ PO TBCR
20.0000 meq | EXTENDED_RELEASE_TABLET | Freq: Once | ORAL | Status: AC
  Administered 2016-01-18: 20 meq via ORAL
  Filled 2016-01-18: qty 1

## 2016-01-18 MED ORDER — FUROSEMIDE 10 MG/ML IJ SOLN
40.0000 mg | Freq: Once | INTRAMUSCULAR | Status: AC
  Administered 2016-01-18: 40 mg via INTRAVENOUS
  Filled 2016-01-18: qty 4

## 2016-01-18 MED FILL — Lidocaine HCl IV Inj 20 MG/ML: INTRAVENOUS | Qty: 5 | Status: AC

## 2016-01-18 MED FILL — Mannitol IV Soln 20%: INTRAVENOUS | Qty: 500 | Status: AC

## 2016-01-18 MED FILL — Potassium Chloride Inj 2 mEq/ML: INTRAVENOUS | Qty: 40 | Status: AC

## 2016-01-18 MED FILL — Heparin Sodium (Porcine) Inj 1000 Unit/ML: INTRAMUSCULAR | Qty: 30 | Status: AC

## 2016-01-18 MED FILL — Heparin Sodium (Porcine) Inj 1000 Unit/ML: INTRAMUSCULAR | Qty: 10 | Status: AC

## 2016-01-18 MED FILL — Sodium Bicarbonate IV Soln 8.4%: INTRAVENOUS | Qty: 50 | Status: AC

## 2016-01-18 MED FILL — Sodium Chloride IV Soln 0.9%: INTRAVENOUS | Qty: 2000 | Status: AC

## 2016-01-18 MED FILL — Magnesium Sulfate Inj 50%: INTRAMUSCULAR | Qty: 10 | Status: AC

## 2016-01-18 MED FILL — Electrolyte-R (PH 7.4) Solution: INTRAVENOUS | Qty: 1000 | Status: AC

## 2016-01-18 NOTE — Progress Notes (Signed)
1 Day Post-Op Procedure(s) (LRB): CORONARY ARTERY BYPASS GRAFTING (CABG) x 4 (LIMA to LAD, SVG to DIAGONAL, SVG to OM, and RIMA to RCA) with EVH from [place] and BILATERAL MAMMARY HARVEST (N/A) TRANSESOPHAGEAL ECHOCARDIOGRAM (TEE) (N/A) Subjective: Sore  Objective: Vital signs in last 24 hours: Temp:  [98.1 F (36.7 C)-99.9 F (37.7 C)] 98.8 F (37.1 C) (10/06 0900) Pulse Rate:  [88-114] 101 (10/06 0900) Cardiac Rhythm: Sinus tachycardia (10/06 0800) Resp:  [12-30] 14 (10/06 0900) BP: (83-133)/(45-98) 112/69 (10/06 0900) SpO2:  [93 %-100 %] 96 % (10/06 0900) Arterial Line BP: (74-130)/(55-79) 98/58 (10/06 0900) FiO2 (%):  [40 %-50 %] 40 % (10/05 2305) Weight:  [89 kg (196 lb 3.4 oz)] 89 kg (196 lb 3.4 oz) (10/06 0500)  Hemodynamic parameters for last 24 hours: PAP: (30-54)/(13-31) 35/15 CO:  [3.6 L/min-5.9 L/min] 4.5 L/min CI:  [1.8 L/min/m2-3 L/min/m2] 2.3 L/min/m2  Intake/Output from previous day: 10/05 0701 - 10/06 0700 In: 6253.2 [I.V.:5393.2; Blood:210; NG/GT:80; IV Piggyback:570] Out: [id number] [Urine:3575; Emesis/NG output:70; Blood:1000; Chest Tube:480] Intake/Output this shift: Total I/O In: 62.9 [I.V.:62.9] Out: 85 [Urine:75; Chest Tube:10]  General appearance: alert and cooperative Neurologic: intact Heart: regular rate and rhythm, S1, S2 normal, no murmur, click, rub or gallop Lungs: clear to auscultation bilaterally Extremities: mild edema Wound: dressings dry  Lab Results:  Recent Labs  01/17/16 2033 01/18/16 0403  WBC 14.9* 13.2*  HGB 11.5* 11.1*  HCT 34.7* 33.8*  PLT 169 176   BMET:  Recent Labs  01/17/16 0551  01/17/16 1847 01/17/16 2016 01/18/16 0403  NA 138  < > 134* 139 136  K 4.0  < > 4.5 4.0 4.1  CL 100*  < > 99*  --  104  CO2 27  --   --   --  25  GLUCOSE 127*  < > 175* 145* 143*  BUN 12  < > 13  --  11  CREATININE 1.22  < > 0.70  --  0.97  CALCIUM 9.3  --   --   --  8.0*  < > = values in this interval  not displayed.  PT/INR:  Recent Labs  01/17/16 2033  LABPROT 16.1*  INR 1.28   ABG    Component Value Date/Time   PHART 7.348 (L) 01/18/2016 0052   HCO3 23.6 01/18/2016 0052   TCO2 25 01/18/2016 0052   ACIDBASEDEF 2.0 01/18/2016 0052   O2SAT 94.0 01/18/2016 0052   CBG (last 3)   Recent Labs  01/18/16 0609 01/18/16 0700 01/18/16 0758  GLUCAP 99 121* 75   CXR: clear  ECG: NSR, no acute changes from preop. Possible inferior infarct  Assessment/Plan: S/P Procedure(s) (LRB): CORONARY ARTERY BYPASS GRAFTING (CABG) x 4 (LIMA to LAD, SVG to DIAGONAL, SVG to OM, and RIMA to RCA) with EVH from [place] and BILATERAL MAMMARY HARVEST (N/A) TRANSESOPHAGEAL ECHOCARDIOGRAM (TEE) (N/A)  He is hemodynamically stable in sinus rhythm Mobilize preop Hgb A1c was 5.5 with no hx of DM. DC insulin drip and follow sliding scale today but I don't think he will need this long. Diuresis: wt is 12 lbs over preop. Continue foley due to diuresing patient and patient in ICU See progression orders   LOS: 2 days    Gaye Pollack 01/18/2016

## 2016-01-18 NOTE — Progress Notes (Signed)
Patient ID: Luke Smith, male   DOB: 09-17-1960, 55 y.o.   MRN: [id number] EVENING ROUNDS NOTE :     [address]       [place] [address]             [phone]                 1 Day Post-Op Procedure(s) (LRB): CORONARY ARTERY BYPASS GRAFTING (CABG) x 4 (LIMA to LAD, SVG to DIAGONAL, SVG to OM, and RIMA to RCA) with EVH from [place] and BILATERAL MAMMARY HARVEST (N/A) TRANSESOPHAGEAL ECHOCARDIOGRAM (TEE) (N/A)  Total Length of Stay:  LOS: 2 days  BP 101/75   Pulse (!) 111   Temp 98.3 F (36.8 C)   Resp (!) 22   Ht 5\' 9"  (1.753 m)   Wt 196 lb 3.4 oz (89 kg)   SpO2 93%   BMI 28.98 kg/m   .Intake/Output      10/05 0701 - 10/06 0700 10/06 0701 - 10/07 0700   P.O.     I.V. (mL/kg) 5393.2 (60.6) 242.9 (2.7)   Blood 210    NG/GT 80    IV Piggyback 570 100   Total Intake(mL/kg) 6253.2 (70.3) 342.9 (3.9)   Urine (mL/kg/hr) 3575 (1.7) 1105 (1)   Emesis/NG output 70 (0)    Blood 1000 (0.5)    Chest Tube 480 (0.2) 10 (0)   Total Output 5125 1115   Net +1128.2 -772.1          . sodium chloride    . sodium chloride 250 mL (01/18/16 0600)  . sodium chloride 10 mL/hr at 01/17/16 2000  . dexmedetomidine Stopped (01/17/16 2225)  . DOPamine Stopped (01/18/16 0100)  . lactated ringers 20 mL/hr at 01/17/16 2100  . lactated ringers 20 mL/hr at 01/17/16 2000  . nitroGLYCERIN 0 mcg/min (01/17/16 2100)  . phenylephrine (NEO-SYNEPHRINE) Adult infusion Stopped (01/18/16 0200)     Lab Results  Component Value Date   WBC 16.1 (H) 01/18/2016   HGB 11.9 (L) 01/18/2016   HCT 35.0 (L) 01/18/2016   PLT 209 01/18/2016   GLUCOSE 144 (H) 01/18/2016   CHOL 168 01/16/2016   TRIG 156 (H) 01/16/2016   HDL 41 01/16/2016   LDLCALC 96 01/16/2016   ALT 68 (H) 01/15/2016   AST 51 (H) 01/15/2016   NA 136 01/18/2016   K 4.2 01/18/2016   CL 101 01/18/2016   CREATININE 1.00 01/18/2016   BUN 15 01/18/2016   CO2 25 01/18/2016   INR 1.28 01/17/2016     HGBA1C 5.5 01/16/2016   Stable day Tubes out  Stepdown in am  Grace Isaac MD  Beeper [phone] Office [phone] 01/18/2016 6:51 PM

## 2016-01-19 ENCOUNTER — Inpatient Hospital Stay (HOSPITAL_COMMUNITY): Payer: BLUE CROSS/BLUE SHIELD

## 2016-01-19 LAB — GLUCOSE, CAPILLARY
GLUCOSE-CAPILLARY: 128 mg/dL — AB (ref 65–99)
GLUCOSE-CAPILLARY: 123 mg/dL — AB (ref 65–99)
GLUCOSE-CAPILLARY: 142 mg/dL — AB (ref 65–99)
Glucose-Capillary: 108 mg/dL — ABNORMAL HIGH (ref 65–99)
Glucose-Capillary: 131 mg/dL — ABNORMAL HIGH (ref 65–99)
Glucose-Capillary: 98 mg/dL (ref 65–99)

## 2016-01-19 LAB — BASIC METABOLIC PANEL
ANION GAP: 6 (ref 5–15)
BUN: 16 mg/dL (ref 6–20)
CHLORIDE: 103 mmol/L (ref 101–111)
CO2: 26 mmol/L (ref 22–32)
Calcium: 8.1 mg/dL — ABNORMAL LOW (ref 8.9–10.3)
Creatinine, Ser: 1.09 mg/dL (ref 0.61–1.24)
GFR calc Af Amer: >60 mL/min (ref >60)
Glucose, Bld: 130 mg/dL — ABNORMAL HIGH (ref 65–99)
POTASSIUM: 4.1 mmol/L (ref 3.5–5.1)
SODIUM: 135 mmol/L (ref 135–145)

## 2016-01-19 LAB — CBC
HEMATOCRIT: 32.6 % — AB (ref 39.0–52.0)
HEMOGLOBIN: 10.3 g/dL — AB (ref 13.0–17.0)
MCH: 29.3 pg (ref 26.0–34.0)
MCHC: 31.6 g/dL (ref 30.0–36.0)
MCV: 92.6 fL (ref 78.0–100.0)
Platelets: 163 10*3/uL (ref 150–400)
RBC: 3.52 MIL/uL — AB (ref 4.22–5.81)
RDW: 12.8 % (ref 11.5–15.5)
WBC: 11.7 10*3/uL — AB (ref 4.0–10.5)

## 2016-01-19 MED ORDER — OXYCODONE HCL 5 MG PO TABS
5.0000 mg | ORAL_TABLET | ORAL | Status: DC | PRN
  Administered 2016-01-19 – 2016-01-22 (×11): 10 mg via ORAL
  Filled 2016-01-19 (×13): qty 2

## 2016-01-19 MED ORDER — ONDANSETRON HCL 4 MG/2ML IJ SOLN: 4.0000 mg | Freq: Four times a day (QID) | INTRAMUSCULAR | Status: DC | PRN

## 2016-01-19 MED ORDER — TRAMADOL HCL 50 MG PO TABS
50.0000 mg | ORAL_TABLET | ORAL | Status: DC | PRN
  Administered 2016-01-19: 50 mg via ORAL
  Administered 2016-01-19: 100 mg via ORAL
  Administered 2016-01-20 (×2): 50 mg via ORAL
  Administered 2016-01-20 – 2016-01-22 (×3): 100 mg via ORAL
  Filled 2016-01-19 (×2): qty 1
  Filled 2016-01-19 (×5): qty 2

## 2016-01-19 MED ORDER — SODIUM CHLORIDE 0.9 % IV SOLN: 250.0000 mL | INTRAVENOUS | Status: DC | PRN

## 2016-01-19 MED ORDER — MOVING RIGHT ALONG BOOK
Freq: Once | Status: AC
  Administered 2016-01-19: 1
  Filled 2016-01-19: qty 1

## 2016-01-19 MED ORDER — ASPIRIN EC 81 MG PO TBEC
81.0000 mg | DELAYED_RELEASE_TABLET | Freq: Every day | ORAL | Status: DC
  Administered 2016-01-19 – 2016-01-22 (×4): 81 mg via ORAL
  Filled 2016-01-19 (×4): qty 1

## 2016-01-19 MED ORDER — BISACODYL 10 MG RE SUPP: 10.0000 mg | Freq: Every day | RECTAL | Status: DC | PRN

## 2016-01-19 MED ORDER — BISACODYL 5 MG PO TBEC
10.0000 mg | DELAYED_RELEASE_TABLET | Freq: Every day | ORAL | Status: DC | PRN
  Administered 2016-01-19 – 2016-01-21 (×3): 10 mg via ORAL
  Filled 2016-01-19 (×2): qty 2

## 2016-01-19 MED ORDER — INSULIN ASPART 100 UNIT/ML ~~LOC~~ SOLN
0.0000 [IU] | Freq: Three times a day (TID) | SUBCUTANEOUS | Status: DC
  Administered 2016-01-19 – 2016-01-20 (×3): 2 [IU] via SUBCUTANEOUS

## 2016-01-19 MED ORDER — DOCUSATE SODIUM 100 MG PO CAPS
200.0000 mg | ORAL_CAPSULE | Freq: Every day | ORAL | Status: DC
  Administered 2016-01-19 – 2016-01-21 (×3): 200 mg via ORAL
  Filled 2016-01-19 (×3): qty 2

## 2016-01-19 MED ORDER — ONDANSETRON HCL 4 MG PO TABS: 4.0000 mg | ORAL_TABLET | Freq: Four times a day (QID) | ORAL | Status: DC | PRN

## 2016-01-19 MED ORDER — SODIUM CHLORIDE 0.9% FLUSH
3.0000 mL | Freq: Two times a day (BID) | INTRAVENOUS | Status: DC
  Administered 2016-01-19 – 2016-01-22 (×7): 3 mL via INTRAVENOUS

## 2016-01-19 MED ORDER — SODIUM CHLORIDE 0.9% FLUSH: 3.0000 mL | INTRAVENOUS | Status: DC | PRN

## 2016-01-19 MED ORDER — METOPROLOL TARTRATE 12.5 MG HALF TABLET
12.5000 mg | ORAL_TABLET | Freq: Two times a day (BID) | ORAL | Status: DC
  Administered 2016-01-19 (×2): 12.5 mg via ORAL
  Filled 2016-01-19 (×2): qty 1

## 2016-01-19 MED ORDER — PANTOPRAZOLE SODIUM 40 MG PO TBEC
40.0000 mg | DELAYED_RELEASE_TABLET | Freq: Every day | ORAL | Status: DC
  Administered 2016-01-19 – 2016-01-22 (×4): 40 mg via ORAL
  Filled 2016-01-19 (×4): qty 1

## 2016-01-19 NOTE — Progress Notes (Signed)
Right IJ removed as per order and pressure applied to area x 10 minutes.  Pressure dressing applied to area and patient instructed on rationale for bedrest x 1 hour.   Wife in attendance.  Pain currently being managed well with oxycodone approximately Q 3 H.

## 2016-01-19 NOTE — Plan of Care (Signed)
Problem: Cardiac: Goal: Hemodynamic stability will improve Outcome: Progressing EPW intact Patient on portable monitor on 2W for continued postoperative monitoring.  Problem: Education: Goal: Knowledge of disease or condition will improve Outcome: Progressing Reviewed "Moving Right Along" book with patient and wife.  Responds well to "teach-back" sessions with staff.

## 2016-01-19 NOTE — Progress Notes (Signed)
Patient transferred to 2W21 and introduced to primary nurse, Jeneen Rinks, and to unit routine.  Patient and wife progressing with post-op education process and wife given copy of post-op care book for her use.  The teaching session consisted of a discussion about pain management and it's importance in relation to overall well-being and increased mobility.  Importance of pulmonary toileting also re-enforced.  Comprehension of information discussed ascertained by asking questions and utilizing an open dialogue.

## 2016-01-19 NOTE — Progress Notes (Addendum)
Patient ID: JAECION ALBINI, male   DOB: 01/03/1961, 55 y.o.   MRN: [id number] TCTS DAILY ICU PROGRESS NOTE                   [address]            [place] [address]          [phone]   2 Days Post-Op Procedure(s) (LRB): CORONARY ARTERY BYPASS GRAFTING (CABG) x 4 (LIMA to LAD, SVG to DIAGONAL, SVG to OM, and RIMA to RCA) with EVH from [place] and BILATERAL MAMMARY HARVEST (N/A) TRANSESOPHAGEAL ECHOCARDIOGRAM (TEE) (N/A)  Total Length of Stay:  LOS: 3 days   Subjective: Walked around unit   Objective: Vital signs in last 24 hours: Temp:  [97.7 F (36.5 C)-98.7 F (37.1 C)] 97.7 F (36.5 C) (10/07 0759) Pulse Rate:  [96-111] 103 (10/07 0700) Cardiac Rhythm: Sinus tachycardia (10/07 0900) Resp:  [13-23] 17 (10/07 0700) BP: (93-117)/(60-84) 105/76 (10/07 0700) SpO2:  [89 %-99 %] 98 % (10/07 0900) Weight:  [194 lb 0.1 oz (88 kg)] 194 lb 0.1 oz (88 kg) (10/07 0500)  Filed Weights   01/17/16 0610 01/18/16 0500 01/19/16 0500  Weight: 184 lb (83.5 kg) 196 lb 3.4 oz (89 kg) 194 lb 0.1 oz (88 kg)    Weight change: -2 lb 3.3 oz (-1 kg)   Hemodynamic parameters for last 24 hours:    Intake/Output from previous day: 10/06 0701 - 10/07 0700 In: 570.9 [I.V.:400.9; IV Piggyback:150] Out: 1440 [Urine:1430; Chest Tube:10]  Intake/Output this shift: Total I/O In: 120 [P.O.:120] Out: 25 [Urine:25]  Current Meds: Scheduled Meds: . acetaminophen  1,000 mg Oral Q6H   Or  . acetaminophen (TYLENOL) oral liquid 160 mg/5 mL  1,000 mg Per Tube Q6H  . aspirin EC  325 mg Oral Daily   Or  . aspirin  324 mg Per Tube Daily  . atorvastatin  80 mg Oral q1800  . bisacodyl  10 mg Oral Daily   Or  . bisacodyl  10 mg Rectal Daily  . cefUROXime (ZINACEF)  IV  1.5 g Intravenous Q12H  . docusate sodium  200 mg Oral Daily  . enoxaparin (LOVENOX) injection  40 mg Subcutaneous QHS  . insulin aspart  0-24 Units Subcutaneous Q4H  . mouth rinse  15 mL  Mouth Rinse BID  . metoprolol tartrate  12.5 mg Oral BID   Or  . metoprolol tartrate  12.5 mg Per Tube BID  . pantoprazole  40 mg Oral Daily  . sodium chloride flush  3 mL Intravenous Q12H   Continuous Infusions: . sodium chloride    . sodium chloride 250 mL (01/18/16 0600)  . sodium chloride Stopped (01/19/16 0700)  . dexmedetomidine Stopped (01/17/16 2225)  . DOPamine Stopped (01/18/16 0100)  . nitroGLYCERIN 0 mcg/min (01/17/16 2100)  . phenylephrine (NEO-SYNEPHRINE) Adult infusion Stopped (01/18/16 0200)   PRN Meds:.sodium chloride, metoprolol, midazolam, morphine injection, ondansetron (ZOFRAN) IV, oxyCODONE, sodium chloride flush, traMADol  General appearance: alert and cooperative Neurologic: intact Heart: regular rate and rhythm, S1, S2 normal, no murmur, click, rub or gallop Lungs: clear to auscultation bilaterally Abdomen: soft, non-tender; bowel sounds normal; no masses,  no organomegaly Extremities: extremities normal, atraumatic, no cyanosis or edema and Homans sign is negative, no sign of DVT Wound: sternum intact  Lab Results: CBC: Recent Labs  01/18/16 1700 01/18/16 1708 01/19/16 0310  WBC 16.1*  --  11.7*  HGB 11.6* 11.9*  10.3*  HCT 36.1* 35.0* 32.6*  PLT 209  --  163   BMET:  Recent Labs  01/18/16 0403  01/18/16 1708 01/19/16 0310  NA 136  --  136 135  K 4.1  --  4.2 4.1  CL 104  --  101 103  CO2 25  --   --  26  GLUCOSE 143*  --  144* 130*  BUN 11  --  15 16  CREATININE 0.97  < > 1.00 1.09  CALCIUM 8.0*  --   --  8.1*  < > = values in this interval not displayed.  CMET: Lab Results  Component Value Date   WBC 11.7 (H) 01/19/2016   HGB 10.3 (L) 01/19/2016   HCT 32.6 (L) 01/19/2016   PLT 163 01/19/2016   GLUCOSE 130 (H) 01/19/2016   CHOL 168 01/16/2016   TRIG 156 (H) 01/16/2016   HDL 41 01/16/2016   LDLCALC 96 01/16/2016   ALT 68 (H) 01/15/2016   AST 51 (H) 01/15/2016   NA 135 01/19/2016   K 4.1 01/19/2016   CL 103 01/19/2016    CREATININE 1.09 01/19/2016   BUN 16 01/19/2016   CO2 26 01/19/2016   INR 1.28 01/17/2016   HGBA1C 5.5 01/16/2016    PT/INR:  Recent Labs  01/17/16 2033  LABPROT 16.1*  INR 1.28   Radiology: Dg Chest Port 1 View  Result Date: 01/19/2016 CLINICAL DATA:  Status post coronary artery bypass grafting. Atelectasis. EXAM: PORTABLE CHEST 1 VIEW COMPARISON:  January 18, 2016 FINDINGS: Swan-Ganz catheter is been removed. Cordis tip is in the superior vena cava. There is been removal both chest tubes and a mediastinal drain. There is no evident pneumothorax. There is patchy atelectasis in the left base and medial right base regions. Lungs elsewhere clear. Heart is upper normal in size with pulmonary vascularity within normal limits. No adenopathy. No bone lesions. IMPRESSION: No pneumothorax. Bibasilar atelectasis, somewhat more on the left than on the right. Lungs elsewhere clear. Stable cardiac silhouette. Electronically Signed   By: Lowella Grip III M.D.   On: 01/19/2016 08:10     Assessment/Plan: S/P Procedure(s) (LRB): CORONARY ARTERY BYPASS GRAFTING (CABG) x 4 (LIMA to LAD, SVG to DIAGONAL, SVG to OM, and RIMA to RCA) with EVH from [place] and BILATERAL MAMMARY HARVEST (N/A) TRANSESOPHAGEAL ECHOCARDIOGRAM (TEE) (N/A) Mobilize Diabetes control d/c tubes/lines Plan for transfer to step-down: see transfer orders See progression orders Mild elevation of LFT's- watch with high dose statin Discuss with Cardiology Plavix at d/c home     Grace Isaac 01/19/2016 10:00 AM

## 2016-01-19 NOTE — Plan of Care (Signed)
Problem: Activity: Goal: Risk for activity intolerance will decrease Outcome: Progressing Patient ambulated 300 feet with minimal assist. Tolerated well.  Problem: Nutritional: Goal: Risk for body nutrition deficit will decrease Outcome: Progressing Patient tolerating cardiac/carb modified diet.  Problem: Pain Management: Goal: Pain level will decrease Outcome: Progressing Patient pain well controlled.

## 2016-01-20 ENCOUNTER — Inpatient Hospital Stay (HOSPITAL_COMMUNITY): Payer: BLUE CROSS/BLUE SHIELD

## 2016-01-20 LAB — GLUCOSE, CAPILLARY
GLUCOSE-CAPILLARY: 111 mg/dL — AB (ref 65–99)
Glucose-Capillary: 110 mg/dL — ABNORMAL HIGH (ref 65–99)
Glucose-Capillary: 131 mg/dL — ABNORMAL HIGH (ref 65–99)

## 2016-01-20 LAB — CBC
HCT: 30.9 % — ABNORMAL LOW (ref 39.0–52.0)
Hemoglobin: 9.9 g/dL — ABNORMAL LOW (ref 13.0–17.0)
MCH: 29.7 pg (ref 26.0–34.0)
MCHC: 32 g/dL (ref 30.0–36.0)
MCV: 92.8 fL (ref 78.0–100.0)
Platelets: 200 10*3/uL (ref 150–400)
RBC: 3.33 MIL/uL — ABNORMAL LOW (ref 4.22–5.81)
RDW: 12.8 % (ref 11.5–15.5)
WBC: 12.4 10*3/uL — ABNORMAL HIGH (ref 4.0–10.5)

## 2016-01-20 LAB — BASIC METABOLIC PANEL
Anion gap: 8 (ref 5–15)
BUN: 20 mg/dL (ref 6–20)
CO2: 27 mmol/L (ref 22–32)
Calcium: 8.2 mg/dL — ABNORMAL LOW (ref 8.9–10.3)
Chloride: 100 mmol/L — ABNORMAL LOW (ref 101–111)
Creatinine, Ser: 1.15 mg/dL (ref 0.61–1.24)
GFR calc Af Amer: >60 mL/min (ref >60)
GFR calc non Af Amer: >60 mL/min (ref >60)
Glucose, Bld: 117 mg/dL — ABNORMAL HIGH (ref 65–99)
Potassium: 3.9 mmol/L (ref 3.5–5.1)
Sodium: 135 mmol/L (ref 135–145)

## 2016-01-20 MED ORDER — GUAIFENESIN ER 600 MG PO TB12
1200.0000 mg | ORAL_TABLET | Freq: Two times a day (BID) | ORAL | Status: DC
  Administered 2016-01-20 – 2016-01-22 (×5): 1200 mg via ORAL
  Filled 2016-01-20 (×6): qty 2

## 2016-01-20 MED ORDER — POLYETHYLENE GLYCOL 3350 17 G PO PACK: 17.0000 g | PACK | Freq: Every day | ORAL | Status: DC | PRN

## 2016-01-20 MED ORDER — METOPROLOL TARTRATE 25 MG PO TABS
25.0000 mg | ORAL_TABLET | Freq: Two times a day (BID) | ORAL | Status: DC
  Administered 2016-01-20 (×2): 25 mg via ORAL
  Filled 2016-01-20 (×2): qty 1

## 2016-01-20 NOTE — Progress Notes (Addendum)
      [address]       [place] [address]             [phone]      3 Days Post-Op Procedure(s) (LRB): CORONARY ARTERY BYPASS GRAFTING (CABG) x 4 (LIMA to LAD, SVG to DIAGONAL, SVG to OM, and RIMA to RCA) with EVH from [place] and BILATERAL MAMMARY HARVEST (N/A) TRANSESOPHAGEAL ECHOCARDIOGRAM (TEE) (N/A) Subjective: Feels good today. No BM  Objective: Vital signs in last 24 hours: Temp:  [98.2 F (36.8 C)-99 F (37.2 C)] 98.2 F (36.8 C) (10/08 0435) Pulse Rate:  [102-118] 115 (10/08 0435) Cardiac Rhythm: Normal sinus rhythm (10/08 0815) Resp:  [18-19] 18 (10/08 0435) BP: (108-124)/(65-77) 118/74 (10/08 0435) SpO2:  [88 %-99 %] 98 % (10/08 0435) Weight:  [194 lb 12.8 oz (88.4 kg)] 194 lb 12.8 oz (88.4 kg) (10/08 0343)     Intake/Output from previous day: 10/07 0701 - 10/08 0700 In: 680 [P.O.:680] Out: 55 [Urine:55] Intake/Output this shift: Total I/O In: 120 [P.O.:120] Out: -   General appearance: alert, cooperative and no distress Heart: S1, S2 normal, sinus tachycardia, rate 100s Lungs: clear to auscultation bilaterally Abdomen: soft, non-tender; bowel sounds normal; no masses,  no organomegaly Extremities: extremities normal, atraumatic, no cyanosis or edema Wound: c/d/i without drainage  Lab Results:  Recent Labs  01/19/16 0310 01/20/16 0323  WBC 11.7* 12.4*  HGB 10.3* 9.9*  HCT 32.6* 30.9*  PLT 163 200   BMET:  Recent Labs  01/19/16 0310 01/20/16 0323  NA 135 135  K 4.1 3.9  CL 103 100*  CO2 26 27  GLUCOSE 130* 117*  BUN 16 20  CREATININE 1.09 1.15  CALCIUM 8.1* 8.2*    PT/INR:  Recent Labs  01/17/16 2033  LABPROT 16.1*  INR 1.28   ABG    Component Value Date/Time   PHART 7.348 (L) 01/18/2016 0052   HCO3 23.6 01/18/2016 0052   TCO2 26 01/18/2016 1708   ACIDBASEDEF 2.0 01/18/2016 0052   O2SAT 94.0 01/18/2016 0052   CBG (last 3)   Recent Labs  01/19/16 1117 01/19/16 1637  01/19/16 2107  GLUCAP 131* 98 142*    Assessment/Plan: S/P Procedure(s) (LRB): CORONARY ARTERY BYPASS GRAFTING (CABG) x 4 (LIMA to LAD, SVG to DIAGONAL, SVG to OM, and RIMA to RCA) with EVH from [place] and BILATERAL MAMMARY HARVEST (N/A) TRANSESOPHAGEAL ECHOCARDIOGRAM (TEE) (N/A)  1. CV-sinus tachycardia. Good Bp. Will increase Lopressor to 25mg  BID.  2. Pulm-tolerating RA with good oxygen saturation. Encourage Is. CXR showed bilateral atelectasis with no evidence of pleural effusion. No pneumothorax.  3. Renal-Creatinine 1.15, stable. Remains about 6lbs fluid overloaded according to weight, however these results not consistent with physical exam.  4. H & H stable- expected acute blood loss anemia. 5. Endocrine-CBGs well controlled 6. DVT proph- Lovenox. 7. Will require Plavix at discharge    Plan: Progressing well. Mobilize. Encourage Is. Increase Lopressor for improved HR control. Added mucinex for secretions. Will add PRN Miralax for constipation.    LOS: 4 days    Luke Smith 01/20/2016  Home 1-2 days  Weight still up slightly I have seen and examined Luke Smith and agree with the above assessment  and plan.  Grace Isaac MD [facility] [phone] Office [phone] 01/20/2016 10:44 AM

## 2016-01-21 LAB — GLUCOSE, CAPILLARY
GLUCOSE-CAPILLARY: 119 mg/dL — AB (ref 65–99)
Glucose-Capillary: 94 mg/dL (ref 65–99)
Glucose-Capillary: 116 mg/dL — ABNORMAL HIGH (ref 65–99)
Glucose-Capillary: 97 mg/dL (ref 65–99)
Glucose-Capillary: 142 mg/dL — ABNORMAL HIGH (ref 65–99)

## 2016-01-21 MED ORDER — POTASSIUM CHLORIDE CRYS ER 20 MEQ PO TBCR
40.0000 meq | EXTENDED_RELEASE_TABLET | Freq: Every day | ORAL | Status: DC
Start: 2016-01-21 — End: 2016-01-22
  Administered 2016-01-21 – 2016-01-22 (×2): 40 meq via ORAL
  Filled 2016-01-21 (×2): qty 2

## 2016-01-21 MED ORDER — METOPROLOL TARTRATE 25 MG PO TABS
37.5000 mg | ORAL_TABLET | Freq: Two times a day (BID) | ORAL | Status: DC
  Administered 2016-01-21 – 2016-01-22 (×3): 37.5 mg via ORAL
  Filled 2016-01-21 (×3): qty 1

## 2016-01-21 MED ORDER — FUROSEMIDE 40 MG PO TABS
40.0000 mg | ORAL_TABLET | Freq: Every day | ORAL | Status: DC
  Administered 2016-01-21 – 2016-01-22 (×2): 40 mg via ORAL
  Filled 2016-01-21 (×2): qty 1

## 2016-01-21 MED ORDER — MAGNESIUM HYDROXIDE 400 MG/5ML PO SUSP: 30.0000 mL | Freq: Every day | ORAL | Status: DC | PRN

## 2016-01-21 NOTE — Progress Notes (Signed)
CARDIAC REHAB PHASE I   PRE:  Rate/Rhythm: 96 SR  BP:  Sitting: 117/83        SaO2: 95 RA  MODE:  Ambulation: 550 ft   POST:  Rate/Rhythm: 106 ST  BP:  Sitting: 112/98         SaO2: 95 RA  Pt ambulated 550 ft on RA, used rolling walker first 150 ft, then proceeded to ambulate with only hand held assist, steady gait, tolerated well with no complaints other than general fatigue. Encouraged IS, additional ambulation x1 today. Pt to recliner after walk, feet elevated, call bell within reach. Will follow-up tomorrow to complete discharge education with pt and wife.    [place], RN, BSN 01/21/2016 2:14 PM

## 2016-01-21 NOTE — Progress Notes (Signed)
Patient ambulated in hallway with wife. Back in room will monitor patient. Stellah Donovan Jessup RN  

## 2016-01-21 NOTE — Progress Notes (Addendum)
[address]       [place] [address]             [phone]      4 Days Post-Op Procedure(s) (LRB): CORONARY ARTERY BYPASS GRAFTING (CABG) x 4 (LIMA to LAD, SVG to DIAGONAL, SVG to OM, and RIMA to RCA) with EVH from [place] and BILATERAL MAMMARY HARVEST (N/A) TRANSESOPHAGEAL ECHOCARDIOGRAM (TEE) (N/A) Subjective: Feels well, mild constipation   Objective: Vital signs in last 24 hours: Temp:  [97.6 F (36.4 C)-98.3 F (36.8 C)] 97.6 F (36.4 C) (10/09 0550) Pulse Rate:  [102-116] 102 (10/09 0550) Cardiac Rhythm: Normal sinus rhythm (10/08 2100) Resp:  [18] 18 (10/09 0550) BP: (94-119)/(68-86) 119/86 (10/09 0550) SpO2:  [91 %-96 %] 96 % (10/09 0550) Weight:  [192 lb (87.1 kg)] 192 lb (87.1 kg) (10/09 0550)  Hemodynamic parameters for last 24 hours:    Intake/Output from previous day: 10/08 0701 - 10/09 0700 In: 360 [P.O.:360] Out: -  Intake/Output this shift: No intake/output data recorded.   Exam:  Alert NAD Lungs : min dim in bases Cor: RRR, Tachy Abd: benign Ext: min edema Incis : healing well   Lab Results:  Recent Labs  01/19/16 0310 01/20/16 0323  WBC 11.7* 12.4*  HGB 10.3* 9.9*  HCT 32.6* 30.9*  PLT 163 200   BMET:  Recent Labs  01/19/16 0310 01/20/16 0323  NA 135 135  K 4.1 3.9  CL 103 100*  CO2 26 27  GLUCOSE 130* 117*  BUN 16 20  CREATININE 1.09 1.15  CALCIUM 8.1* 8.2*    PT/INR: No results for input(s): LABPROT, INR in the last 72 hours. ABG    Component Value Date/Time   PHART 7.348 (L) 01/18/2016 0052   HCO3 23.6 01/18/2016 0052   TCO2 26 01/18/2016 1708   ACIDBASEDEF 2.0 01/18/2016 0052   O2SAT 94.0 01/18/2016 0052   CBG (last 3)   Recent Labs  01/20/16 1602 01/20/16 2135 01/21/16 0623  GLUCAP 131* 111* 94    Meds Scheduled Meds: . aspirin EC  81 mg Oral Daily  . atorvastatin  80 mg Oral q1800  . docusate sodium  200 mg Oral Daily  . enoxaparin (LOVENOX)  injection  40 mg Subcutaneous QHS  . guaiFENesin  1,200 mg Oral BID  . insulin aspart  0-24 Units Subcutaneous TID AC & HS  . mouth rinse  15 mL Mouth Rinse BID  . metoprolol tartrate  25 mg Oral BID  . pantoprazole  40 mg Oral QAC breakfast  . sodium chloride flush  3 mL Intravenous Q12H   Continuous Infusions:  PRN Meds:.sodium chloride, bisacodyl **OR** bisacodyl, ondansetron **OR** ondansetron (ZOFRAN) IV, oxyCODONE, polyethylene glycol, sodium chloride flush, traMADol  Xrays Dg Chest 2 View  Result Date: 01/20/2016 CLINICAL DATA:  Cough.  Recent coronary artery bypass grafting EXAM: CHEST  2 VIEW COMPARISON:  January 19, 2016 FINDINGS: Cordis has been removed. No pneumothorax. There is scarring in both upper lobes. There is patchy left and right base atelectatic change. Lungs elsewhere clear. Heart size and pulmonary vascularity are normal. 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 There is a degree of pneumomediastinum. No evident adenopathy. IMPRESSION: There is a degree of pneumomediastinum. Mild patchy bibasilar atelectasis. Scarring right upper lobe regions. No frank airspace consolidation. Heart size and pulmonary vascularity within normal limits. No evident pneumothorax. Electronically Signed   By: Lowella Grip III M.D.   On:  01/20/2016 08:54    Assessment/Plan: S/P Procedure(s) (LRB): CORONARY ARTERY BYPASS GRAFTING (CABG) x 4 (LIMA to LAD, SVG to DIAGONAL, SVG to OM, and RIMA to RCA) with EVH from [place] and BILATERAL MAMMARY HARVEST (N/A) TRANSESOPHAGEAL ECHOCARDIOGRAM (TEE) (N/A)  1 doing well 2 cont rehab/pulm toilet 3 increase beta blocker dose 4 no new labs 5 prob home in am if no new issues  LOS: 5 days    GOLD,WAYNE E 01/21/2016   Chart reviewed, patient examined, agree with above. He feels well and is ambulating without chest pain or shortness of breath. Weight is still about 8 lbs over preop and he has some edema. Will  continue diuretic. Plan home tomorrow if no changes.

## 2016-01-21 NOTE — Discharge Summary (Signed)
Physician Discharge Summary  Patient ID: Luke Smith MRN: [id number] DOB/AGE: [birth year] 55 y.o.  Admit date: 01/15/2016 Discharge date: 01/22/2016  Admission Diagnoses: Patient Active Problem List   Diagnosis Date Noted  . Coronary artery disease 01/17/2016  . NSTEMI (non-ST elevated myocardial infarction) ([facility]) 01/15/2016  . Chest pain with moderate risk of acute coronary syndrome 11/29/2015  . History of smoking 11/29/2015  . Sleep apnea   . Abnormal stress test 09/02/2012    Discharge Diagnoses:  Active Problems:   Abnormal stress test   History of smoking   NSTEMI (non-ST elevated myocardial infarction) ([facility])   Coronary artery disease   Discharged Condition: good  HPI:  The patient is a 55 year old gentleman with a history of hypertension, sleep apnea and remote smoking who reports a several month history of substernal chest discomfort radiation up into his throat which he thought was GERD. He underwent EGD and colonoscopy recently which were normal.He was recently in [place] and doing a lot of walking and got the pain but kept walking and it seemed to get better. He was seen by Dr. Gwenlyn Found and underwent a nuclear stress test on 12/05/2015 which showed an EF of 56% with no ischemic changes. His symptoms were not getting better and were occurring at rest and with exertion. He saw Dr. Gwenlyn Found yesterday and arrangements were made for cath on Thursday. After leaving the office he had worsening chest discomfort and diaphoresis and he called 911 last pm. His ECG initially was unremarkable but his troponin poc was 1.92. He was treated with NTG and pain meds and he improved. Cath today shows severe multivessel CAD with 99% mid RCA stenosis with TIMI 2 flow and left to right collaterals. There is a 95% mid LCX stenosis after the first large OM, 85% mid LAD and 75% apical LAD. There is a large diagonal with 70% proximal stenosis. LVEF is 45-50% with an elevated LVEDP of 23. He did not have any  further pain until tonight after eating a large dinner and he started getting the typical chest pain which he is having now. His troponin did peak this am at 10.26   Hospital Course:  Mr. Sickles underwent a coronary bypass grafting 4 with Dr. Cyndia Bent on 01/17/2016. He tolerated the procedure well and was transferred to ICU. He was extubated in a timely manner. He remained fluid overloaded. A diuretic regimen was initiated. His chest tubes removed postop day 1. We continue to work on mobilization. He was transferred to the telemetry unit on postop day 2 with all chest tubes and lines discontinued. He had a mildly elevated LFTs, in the setting of being on statin therapy. This will need to be monitored outpatient. Postop day 3 he continues to progress. We worked on mobilization and encourage incentive spirometry. We increased his Lopressor for improved heart rate control. We added Mucinex for his secretions. He remained fluid overloaded. He will require initiation of Plavix at discharge for NSTEMI. Today, he is tolerating room air, ambulating with limited assistance, and is ready for discharge home. His incisions are healing well.  Consults: cardiology  Significant Diagnostic Studies:  CLINICAL DATA:  Cough.  Recent coronary artery bypass grafting  EXAM: CHEST  2 VIEW  COMPARISON:  January 19, 2016  FINDINGS: Cordis has been removed. No pneumothorax. There is scarring in both upper lobes. There is patchy left and right base atelectatic change. Lungs elsewhere clear. Heart size and pulmonary vascularity are normal. Pacemaker wires remain attached to  the right heart. There is a degree of pneumomediastinum. No evident adenopathy.  IMPRESSION: There is a degree of pneumomediastinum. Mild patchy bibasilar atelectasis. Scarring right upper lobe regions. No frank airspace consolidation. Heart size and pulmonary vascularity within normal limits. No evident pneumothorax.   Electronically  Signed   By: Lowella Grip III M.D.   On: 01/20/2016 08:54  Treatments:  01/17/2016  Surgeon:  Gaye Pollack, MD  First Assistant: Lars Pinks,  PA-C   Preoperative Diagnosis:  Severe multi-vessel coronary artery disease   Postoperative Diagnosis:  Same   Procedure:  1. Median Sternotomy 2. Extracorporeal circulation 3.   Coronary artery bypass grafting x 4   Left internal mammary graft to the LAD  Right internal mammary graft to the RCA  SVG to diagonal  SVG to OM2 4.   Endoscopic vein harvest from the right leg  Discharge Exam: Blood pressure 107/79, pulse 97, temperature 98.5 F (36.9 C), temperature source Oral, resp. rate 17, height 5\' 9"  (1.753 m), weight 188 lb 4.4 oz (85.4 kg), SpO2 95 %.  Alert , NAD Lungs  Dim in bases Cor: RRR ABD: benign Ext: minor edema Incis : healing well   Disposition: Final discharge disposition not confirmed     Medication List    STOP taking these medications   lidocaine 2 % solution Commonly known as:  XYLOCAINE   nitroGLYCERIN 0.4 MG SL tablet Commonly known as:  NITROSTAT   ranitidine 300 MG tablet Commonly known as:  ZANTAC   zolpidem 10 MG tablet Commonly known as:  AMBIEN     TAKE these medications   aspirin 81 MG EC tablet Take 81 mg by mouth daily.   atorvastatin 80 MG tablet Commonly known as:  LIPITOR Take 1 tablet (80 mg total) by mouth daily at 6 PM.   dexlansoprazole 60 MG capsule Commonly known as:  DEXILANT Take 1 capsule (60 mg total) by mouth daily.   furosemide 40 MG tablet Commonly known as:  LASIX Take 1 tablet (40 mg total) by mouth daily.   metoprolol 50 MG tablet Commonly known as:  LOPRESSOR Take 1 tablet (50 mg total) by mouth 2 (two) times daily.   oxyCODONE 5 MG immediate release tablet Commonly known as:  Oxy IR/ROXICODONE Take 1-2 tablets (5-10 mg total) by mouth every 6 (six) hours as needed for severe pain.   potassium chloride SA 20 MEQ  tablet Commonly known as:  K-DUR,KLOR-CON Take 1 tablet (20 mEq total) by mouth daily.     The patient has been discharged on:   1.Beta Blocker:  Yes [ x  ]                              No   [   ]                              If No, reason:  2.Ace Inhibitor/ARB: Yes [   ]                                     No  [  x  ]  If No, reason: titration of beta blocker Low BP at times 3.Statin:   Yes [ x  ]                  No  [   ]                  If No, reason:  4.Ecasa:  Yes  [ x  ]                  No   [   ]                  If No, reason:    Follow-up Information    Gaye Pollack, MD .   Specialty:  Cardiothoracic Surgery Why:  Appointment at 11:00am on 02/20/2016. Please arrive at 10:30am for a chest xray at [facility] located on the first floor of our building.  Contact information: [address] [place] [address] [phone]        Horatio Pel, MD. Call today.   Specialty:  Internal Medicine Contact information: [address] [place] [address] [phone]           Signed: John Giovanni 01/22/2016, 8:11 AM

## 2016-01-21 NOTE — Discharge Instructions (Signed)
Coronary Artery Bypass Grafting, Care After °Refer to this sheet in the next few weeks. These instructions provide you with information on caring for yourself after your procedure. Your health care provider may also give you more specific instructions. Your treatment has been planned according to current medical practices, but problems sometimes occur. Call your health care provider if you have any problems or questions after your procedure. °WHAT TO EXPECT AFTER THE PROCEDURE °Recovery from surgery will be different for everyone. Some people feel well after 3 or 4 weeks, while for others it takes longer. After your procedure, it is typical to have the following: °· Nausea and a lack of appetite.   °· Constipation. °· Weakness and fatigue.   °· Depression or irritability.   °· Pain or discomfort at your incision site. °HOME CARE INSTRUCTIONS °· Take medicines only as directed by your health care provider. Do not stop taking medicines or start any new medicines without first checking with your health care provider. °· Take your pulse as directed by your health care provider. °· Perform deep breathing as directed by your health care provider. If you were given a device called an incentive spirometer, use it to practice deep breathing several times a day. Support your chest with a pillow or your arms when you take deep breaths or cough. °· Keep incision areas clean, dry, and protected. Remove or change any bandages (dressings) only as directed by your health care provider. You may have skin adhesive strips over the incision areas. Do not take the strips off. They will fall off on their own. °· Check incision areas daily for any swelling, redness, or drainage. °· If incisions were made in your legs, do the following: °¨ Avoid crossing your legs.   °¨ Avoid sitting for long periods of time. Change positions every 30 minutes.   °¨ Elevate your legs when you are sitting. °· Wear compression stockings as directed by your  health care provider. These stockings help keep blood clots from forming in your legs. °· Take showers once your health care provider approves. Until then, only take sponge baths. Pat incisions dry. Do not rub incisions with a washcloth or towel. Do not take baths, swim, or use a hot tub until your health care provider approves. °· Eat foods that are high in fiber, such as raw fruits and vegetables, whole grains, beans, and nuts. Meats should be lean cut. Avoid canned, processed, and fried foods. °· Drink enough fluid to keep your urine clear or pale yellow. °· Weigh yourself every day. This helps identify if you are retaining fluid that may make your heart and lungs work harder. °· Rest and limit activity as directed by your health care provider. You may be instructed to: °¨ Stop any activity at once if you have chest pain, shortness of breath, irregular heartbeats, or dizziness. Get help right away if you have any of these symptoms. °¨ Move around frequently for short periods or take short walks as directed by your health care provider. Increase your activities gradually. You may need physical therapy or cardiac rehabilitation to help strengthen your muscles and build your endurance. °¨ Avoid lifting, pushing, or pulling anything heavier than 10 lb (4.5 kg) for at least 6 weeks after surgery. °· Do not drive until your health care provider approves.  °· Ask your health care provider when you may return to work. °· Ask your health care provider when you may resume sexual activity. °· Keep all follow-up visits as directed by your health care   provider. This is important. °SEEK MEDICAL CARE IF: °· You have swelling, redness, increasing pain, or drainage at the site of an incision. °· You have a fever. °· You have swelling in your ankles or legs. °· You have pain in your legs.   °· You gain 2 or more pounds (0.9 kg) a day. °· You are nauseous or vomit. °· You have diarrhea.  °SEEK IMMEDIATE MEDICAL CARE IF: °· You have  chest pain that goes to your jaw or arms. °· You have shortness of breath.   °· You have a fast or irregular heartbeat.   °· You notice a "clicking" in your breastbone (sternum) when you move.   °· You have numbness or weakness in your arms or legs. °· You feel dizzy or light-headed.   °MAKE SURE YOU: °· Understand these instructions. °· Will watch your condition. °· Will get help right away if you are not doing well or get worse. °  °This information is not intended to replace advice given to you by your health care provider. Make sure you discuss any questions you have with your health care provider. °  °Document Released: 10/18/2004 Document Revised: 04/21/2014 Document Reviewed: 09/07/2012 °Elsevier Interactive Patient Education ©2016 [employer] ° °

## 2016-01-22 ENCOUNTER — Encounter: Payer: Self-pay | Admitting: Gastroenterology

## 2016-01-22 LAB — GLUCOSE, CAPILLARY
GLUCOSE-CAPILLARY: 102 mg/dL — AB (ref 65–99)
Glucose-Capillary: 118 mg/dL — ABNORMAL HIGH (ref 65–99)

## 2016-01-22 MED ORDER — METOPROLOL TARTRATE 50 MG PO TABS: 50.0000 mg | ORAL_TABLET | Freq: Two times a day (BID) | ORAL | 1 refills | Status: DC

## 2016-01-22 MED ORDER — FUROSEMIDE 40 MG PO TABS: 40.0000 mg | ORAL_TABLET | Freq: Every day | ORAL | 0 refills | Status: DC

## 2016-01-22 MED ORDER — ATORVASTATIN CALCIUM 80 MG PO TABS: 80.0000 mg | ORAL_TABLET | Freq: Every day | ORAL | 1 refills | Status: DC

## 2016-01-22 MED ORDER — OXYCODONE HCL 5 MG PO TABS: 5.0000 mg | ORAL_TABLET | Freq: Four times a day (QID) | ORAL | 0 refills | Status: DC | PRN

## 2016-01-22 MED ORDER — POTASSIUM CHLORIDE CRYS ER 20 MEQ PO TBCR: 20.0000 meq | EXTENDED_RELEASE_TABLET | Freq: Every day | ORAL | 0 refills | Status: DC

## 2016-01-22 NOTE — Progress Notes (Signed)
CARDIAC REHAB PHASE I   Pt on bedrest post EPW removal. Cardiac surgery discharge education completed with pt and wife at bedside. Reviewed risk factors, IS, sternal precautions, activity progression, exercise, heart healthy diet, sodium recommendations, daily weights, and phase 2 cardiac rehab. Pt verbalized understanding, receptive to education. Pt agrees to phase 2 cardiac rehab referral, will send to [facility] per pt request. Pt in bed, call bell within reach.   [phone] Lenna Sciara, RN, BSN 01/22/2016 11:28 AM

## 2016-01-22 NOTE — Progress Notes (Addendum)
5 Days Post-Op Procedure(s) (LRB): CORONARY ARTERY BYPASS GRAFTING (CABG) x 4 (LIMA to LAD, SVG to DIAGONAL, SVG to OM, and RIMA to RCA) with EVH from [place] and BILATERAL MAMMARY HARVEST (N/A) TRANSESOPHAGEAL ECHOCARDIOGRAM (TEE) (N/A) Subjective: Feels well  Objective: Vital signs in last 24 hours: Temp:  [98.1 F (36.7 C)-98.5 F (36.9 C)] 98.5 F (36.9 C) (10/10 0336) Pulse Rate:  [97-108] 97 (10/10 0336) Cardiac Rhythm: Sinus tachycardia (10/09 1900) Resp:  [17-20] 17 (10/10 0336) BP: (107-131)/(70-85) 107/79 (10/10 0336) SpO2:  [95 %-98 %] 95 % (10/10 0336) Weight:  [188 lb 4.4 oz (85.4 kg)] 188 lb 4.4 oz (85.4 kg) (10/10 0336)  Hemodynamic parameters for last 24 hours:    Intake/Output from previous day: No intake/output data recorded. Intake/Output this shift: No intake/output data recorded.   PE:   Alert , NAD Lungs  Dim in bases Cor: RRR ABD: benign Ext: minor edema Incis : healing well    Lab Results:  Recent Labs  01/20/16 0323  WBC 12.4*  HGB 9.9*  HCT 30.9*  PLT 200   BMET:  Recent Labs  01/20/16 0323  NA 135  K 3.9  CL 100*  CO2 27  GLUCOSE 117*  BUN 20  CREATININE 1.15  CALCIUM 8.2*    PT/INR: No results for input(s): LABPROT, INR in the last 72 hours. ABG    Component Value Date/Time   PHART 7.348 (L) 01/18/2016 0052   HCO3 23.6 01/18/2016 0052   TCO2 26 01/18/2016 1708   ACIDBASEDEF 2.0 01/18/2016 0052   O2SAT 94.0 01/18/2016 0052   CBG (last 3)   Recent Labs  01/21/16 2111 01/21/16 2222 01/22/16 0620  GLUCAP 142* 119* 118*    Meds Scheduled Meds: . aspirin EC  81 mg Oral Daily  . atorvastatin  80 mg Oral q1800  . docusate sodium  200 mg Oral Daily  . enoxaparin (LOVENOX) injection  40 mg Subcutaneous QHS  . furosemide  40 mg Oral Daily  . guaiFENesin  1,200 mg Oral BID  . insulin aspart  0-24 Units Subcutaneous TID AC & HS  . mouth rinse  15 mL Mouth Rinse BID  . metoprolol  tartrate  37.5 mg Oral BID  . pantoprazole  40 mg Oral QAC breakfast  . potassium chloride  40 mEq Oral Daily  . sodium chloride flush  3 mL Intravenous Q12H   Continuous Infusions:  PRN Meds:.sodium chloride, bisacodyl **OR** bisacodyl, magnesium hydroxide, ondansetron **OR** ondansetron (ZOFRAN) IV, oxyCODONE, polyethylene glycol, sodium chloride flush, traMADol  Xrays No results found.  Assessment/Plan: S/P Procedure(s) (LRB): CORONARY ARTERY BYPASS GRAFTING (CABG) x 4 (LIMA to LAD, SVG to DIAGONAL, SVG to OM, and RIMA to RCA) with EVH from [place] and BILATERAL MAMMARY HARVEST (N/A) TRANSESOPHAGEAL ECHOCARDIOGRAM (TEE) (N/A) d/c pacing wires Plan for discharge: see discharge orders   LOS: 6 days    GOLD,WAYNE E 01/22/2016   Chart reviewed, patient examined, agree with above. He looks good and feels well. Plan home today.

## 2016-01-22 NOTE — Progress Notes (Signed)
Patient D/C home with wife. D/C education done. Patient and wife no further questions at this time. IV removed. Tele monitor. CCMD notified. Personal belongings given to the wife. Patient to D/C home with wife.   Domingo Dimes RN

## 2016-01-22 NOTE — Progress Notes (Signed)
EPW removed. VSS. Patient tolerated procedure well. No CP. No SOB. Patient educated on bedrest for 1 hour and Q15 minute vitals. Wife at bedside. Call bell given to patient. WIll continue to monitor.  Domingo Dimes RN

## 2016-01-22 NOTE — Care Management Note (Signed)
Case Management Note Marvetta Gibbons RN, BSN Unit 2W-Case Manager [phone]  Patient Details  Name: JAHSIER RITTENOUR MRN: [id number] Date of Birth: 1960-12-13  Subjective/Objective:  Pt admitted with NSTEMI, s/p CABGx4- tx from ICU to 2W on 01/19/16                   Action/Plan: PTA pt lived at home with spouse- plan to return home with spouse- no CM needs noted  Expected Discharge Date:    01/22/16              Expected Discharge Plan:  Home/Self Care  In-House Referral:     Discharge planning Services  CM Consult  Post Acute Care Choice:    Choice offered to:     DME Arranged:    DME Agency:     HH Arranged:    [place] Agency:     Status of Service:  Completed, signed off  If discussed at [employer] of Stay Meetings, dates discussed:    Additional Comments:  Dawayne Patricia, RN 01/22/2016, 10:43 AM

## 2016-01-29 ENCOUNTER — Ambulatory Visit (INDEPENDENT_AMBULATORY_CARE_PROVIDER_SITE_OTHER): Payer: Self-pay

## 2016-01-29 DIAGNOSIS — Z4802 Encounter for removal of sutures: Secondary | ICD-10-CM

## 2016-01-29 DIAGNOSIS — G8918 Other acute postprocedural pain: Secondary | ICD-10-CM

## 2016-01-29 DIAGNOSIS — Z951 Presence of aortocoronary bypass graft: Secondary | ICD-10-CM

## 2016-01-29 MED ORDER — OXYCODONE HCL 5 MG PO TABS: 5.0000 mg | ORAL_TABLET | Freq: Four times a day (QID) | ORAL | 0 refills | Status: DC | PRN

## 2016-01-29 NOTE — Progress Notes (Signed)
Removed 4 sutures from chest tube incision sites with no signs of infection and patient tolerated well. Refill for pain medication given.

## 2016-02-01 ENCOUNTER — Ambulatory Visit (HOSPITAL_COMMUNITY): Payer: BLUE CROSS/BLUE SHIELD

## 2016-02-05 ENCOUNTER — Emergency Department (HOSPITAL_BASED_OUTPATIENT_CLINIC_OR_DEPARTMENT_OTHER): Payer: BLUE CROSS/BLUE SHIELD

## 2016-02-05 ENCOUNTER — Emergency Department (HOSPITAL_BASED_OUTPATIENT_CLINIC_OR_DEPARTMENT_OTHER)
Admission: EM | Admit: 2016-02-05 | Discharge: 2016-02-05 | Disposition: A | Discharge status: Home / Self Care | Payer: BLUE CROSS/BLUE SHIELD | Attending: Emergency Medicine | Admitting: Emergency Medicine

## 2016-02-05 ENCOUNTER — Encounter (HOSPITAL_BASED_OUTPATIENT_CLINIC_OR_DEPARTMENT_OTHER): Payer: Self-pay | Admitting: Emergency Medicine

## 2016-02-05 DIAGNOSIS — I2510 Atherosclerotic heart disease of native coronary artery without angina pectoris: Secondary | ICD-10-CM | POA: Diagnosis not present

## 2016-02-05 DIAGNOSIS — Z87891 Personal history of nicotine dependence: Secondary | ICD-10-CM | POA: Insufficient documentation

## 2016-02-05 DIAGNOSIS — I10 Essential (primary) hypertension: Secondary | ICD-10-CM | POA: Diagnosis not present

## 2016-02-05 DIAGNOSIS — M545 Low back pain, unspecified: Secondary | ICD-10-CM

## 2016-02-05 DIAGNOSIS — R109 Unspecified abdominal pain: Secondary | ICD-10-CM | POA: Insufficient documentation

## 2016-02-05 DIAGNOSIS — Z79899 Other long term (current) drug therapy: Secondary | ICD-10-CM | POA: Diagnosis not present

## 2016-02-05 DIAGNOSIS — Z7982 Long term (current) use of aspirin: Secondary | ICD-10-CM | POA: Insufficient documentation

## 2016-02-05 DIAGNOSIS — R079 Chest pain, unspecified: Secondary | ICD-10-CM | POA: Insufficient documentation

## 2016-02-05 LAB — COMPREHENSIVE METABOLIC PANEL
ALBUMIN: 4.1 g/dL (ref 3.5–5.0)
ALT: 31 U/L (ref 17–63)
AST: 25 U/L (ref 15–41)
Alkaline Phosphatase: 79 U/L (ref 38–126)
Anion gap: 9 (ref 5–15)
BILIRUBIN TOTAL: 0.4 mg/dL (ref 0.3–1.2)
BUN: 14 mg/dL (ref 6–20)
CHLORIDE: 103 mmol/L (ref 101–111)
CO2: 25 mmol/L (ref 22–32)
Calcium: 9.5 mg/dL (ref 8.9–10.3)
Creatinine, Ser: 1.08 mg/dL (ref 0.61–1.24)
GFR calc Af Amer: >60 mL/min (ref >60)
GFR calc non Af Amer: >60 mL/min (ref >60)
GLUCOSE: 107 mg/dL — AB (ref 65–99)
POTASSIUM: 4.3 mmol/L (ref 3.5–5.1)
Sodium: 137 mmol/L (ref 135–145)
TOTAL PROTEIN: 7.6 g/dL (ref 6.5–8.1)

## 2016-02-05 LAB — CBC WITH DIFFERENTIAL/PLATELET
BASOS ABS: 0 10*3/uL (ref 0.0–0.1)
BASOS PCT: 0 %
Eosinophils Absolute: 0.2 10*3/uL (ref 0.0–0.7)
Eosinophils Relative: 2 %
HEMATOCRIT: 35.5 % — AB (ref 39.0–52.0)
Hemoglobin: 11.5 g/dL — ABNORMAL LOW (ref 13.0–17.0)
Lymphocytes Relative: 19 %
Lymphs Abs: 1.9 10*3/uL (ref 0.7–4.0)
MCH: 29 pg (ref 26.0–34.0)
MCHC: 32.4 g/dL (ref 30.0–36.0)
MCV: 89.6 fL (ref 78.0–100.0)
MONO ABS: 0.7 10*3/uL (ref 0.1–1.0)
Monocytes Relative: 7 %
NEUTROS ABS: 7.3 10*3/uL (ref 1.7–7.7)
NEUTROS PCT: 72 %
Platelets: 372 10*3/uL (ref 150–400)
RBC: 3.96 MIL/uL — ABNORMAL LOW (ref 4.22–5.81)
RDW: 12.5 % (ref 11.5–15.5)
WBC: 10.2 10*3/uL (ref 4.0–10.5)

## 2016-02-05 LAB — URINALYSIS, ROUTINE W REFLEX MICROSCOPIC
GLUCOSE, UA: NEGATIVE mg/dL
Hgb urine dipstick: NEGATIVE
KETONES UR: 15 mg/dL — AB
LEUKOCYTES UA: NEGATIVE
NITRITE: NEGATIVE
PROTEIN: NEGATIVE mg/dL
Specific Gravity, Urine: 1.026 (ref 1.005–1.030)
pH: 5.5 (ref 5.0–8.0)

## 2016-02-05 MED ORDER — IOPAMIDOL (ISOVUE-370) INJECTION 76%
100.0000 mL | Freq: Once | INTRAVENOUS | Status: AC | PRN
  Administered 2016-02-05: 100 mL via INTRAVENOUS

## 2016-02-05 MED ORDER — FENTANYL CITRATE (PF) 100 MCG/2ML IJ SOLN
50.0000 ug | Freq: Once | INTRAMUSCULAR | Status: AC
  Administered 2016-02-05: 50 ug via INTRAVENOUS
  Filled 2016-02-05: qty 2

## 2016-02-05 MED ORDER — ONDANSETRON HCL 4 MG/2ML IJ SOLN
4.0000 mg | Freq: Once | INTRAMUSCULAR | Status: AC
  Administered 2016-02-05: 4 mg via INTRAVENOUS
  Filled 2016-02-05: qty 2

## 2016-02-05 MED ORDER — FENTANYL CITRATE (PF) 100 MCG/2ML IJ SOLN
50.0000 ug | Freq: Once | INTRAMUSCULAR | Status: AC
Start: 2016-02-05 — End: 2016-02-05
  Administered 2016-02-05: 50 ug via INTRAVENOUS
  Filled 2016-02-05: qty 2

## 2016-02-05 MED ORDER — CYCLOBENZAPRINE HCL 10 MG PO TABS: 10.0000 mg | ORAL_TABLET | Freq: Two times a day (BID) | ORAL | 0 refills | Status: DC | PRN

## 2016-02-05 MED ORDER — FENTANYL CITRATE (PF) 100 MCG/2ML IJ SOLN
50.0000 ug | INTRAMUSCULAR | Status: DC | PRN
  Administered 2016-02-05: 50 ug via INTRAVENOUS
  Filled 2016-02-05: qty 2

## 2016-02-05 MED ORDER — SODIUM CHLORIDE 0.9 % IV SOLN
INTRAVENOUS | Status: DC
  Administered 2016-02-05: 13:00:00 via INTRAVENOUS

## 2016-02-05 MED ORDER — HYDROMORPHONE HCL 4 MG PO TABS: 4.0000 mg | ORAL_TABLET | Freq: Four times a day (QID) | ORAL | 0 refills | Status: DC | PRN

## 2016-02-05 MED FILL — HYDROmorphone HCL 4 MG TABS: 4 | 5 days supply | Qty: 20 | Fill #0

## 2016-02-05 MED FILL — CYCLOBENZAPRINE 10 MG TAB: 10 | 10 days supply | Qty: 20 | Fill #0

## 2016-02-05 NOTE — ED Notes (Signed)
Patient transported to CT 

## 2016-02-05 NOTE — ED Triage Notes (Addendum)
Pt reports L lower back pain which started suddenly around 2 this morning with associated SOB due to pain. Denies N/V. Pt had non stemi and CABG 2 weeks ago. Denies urinary symptoms

## 2016-02-05 NOTE — ED Provider Notes (Addendum)
[facility] Provider Note   CSN: [id number] Arrival date & time: 02/05/16  1023     History   Chief Complaint Chief Complaint  Patient presents with  . Back Pain    HPI Luke Smith is a 55 y.o. male.  Patient with acute onset of left-sided back pain left flank pain at 2 this morning patient's been sleeping in a recliner recently since his CABG on October 5 preceded by a non-STEMI on October 3. Patient had been feeling fine until the development of this. Family history of kidney stones but he's never had a kidney stone. Pain is severe 10 out of 10 not made worse by moving the leg made worse by taking a deep breath and also made of a little bit worse by certain movements. No numbness or weakness to the left leg. No dysuria no fevers no significant or new of chest pain still has a little bit of soreness in the chest but that's been baseline. No fevers. No shortness of breath. Room air sats 100%.      Past Medical History:  Diagnosis Date  . GERD (gastroesophageal reflux disease)   . Hypertension   . MI (myocardial infarction)   . Perforated ear drum    left ear  . Sleep apnea     Patient Active Problem List   Diagnosis Date Noted  . Coronary artery disease 01/17/2016  . NSTEMI (non-ST elevated myocardial infarction) ([place]) 01/15/2016  . Chest pain with moderate risk of acute coronary syndrome 11/29/2015  . History of smoking 11/29/2015  . Sleep apnea   . Abnormal stress test 09/02/2012    Past Surgical History:  Procedure Laterality Date  . CARDIAC CATHETERIZATION N/A 01/16/2016   Procedure: Left Heart Cath and Coronary Angiography;  Surgeon: Belva Crome, MD;  Location: [facility];  Service: Cardiovascular;  Laterality: N/A;  . CORONARY ARTERY BYPASS GRAFT N/A 01/17/2016   Procedure: CORONARY ARTERY BYPASS GRAFTING (CABG) x 4 (LIMA to LAD, SVG to DIAGONAL, SVG to OM, and RIMA to RCA) with EVH from [place];  Surgeon: Gaye Pollack, MD;  Location: [facility];  Service: Open Heart Surgery;  Laterality: N/A;  . DENTAL SURGERY  childhood  . NASAL SEPTUM SURGERY    . TEE WITHOUT CARDIOVERSION N/A 01/17/2016   Procedure: TRANSESOPHAGEAL ECHOCARDIOGRAM (TEE);  Surgeon: Gaye Pollack, MD;  Location: [facility];  Service: Open Heart Surgery;  Laterality: N/A;    OB History    No data available       Home Medications    Prior to Admission medications   Medication Sig Start Date End Date Taking? Authorizing Provider  aspirin 81 MG EC tablet Take 81 mg by mouth daily.  11/26/15   Historical Provider, MD  atorvastatin (LIPITOR) 80 MG tablet Take 1 tablet (80 mg total) by mouth daily at 6 PM. 01/22/16   Wayne E Gold, PA-C  cyclobenzaprine (FLEXERIL) 10 MG tablet Take 1 tablet (10 mg total) by mouth 2 (two) times daily as needed for muscle spasms. 02/05/16   Fredia Sorrow, MD  dexlansoprazole (DEXILANT) 60 MG capsule Take 1 capsule (60 mg total) by mouth daily. 01/10/16   Mauri Pole, MD  furosemide (LASIX) 40 MG tablet Take 1 tablet (40 mg total) by mouth daily. 01/22/16   Wayne E Gold, PA-C  HYDROmorphone (DILAUDID) 4 MG tablet Take 1 tablet (4 mg total) by mouth every 6 (six) hours  as needed for severe pain. 02/05/16   Fredia Sorrow, MD  Metoprolol Tartrate (LOPRESSOR) 50 MG tablet Take 1 tablet (50 mg total) by mouth 2 (two) times daily. 01/22/16   Wayne E Gold, PA-C  oxyCODONE (OXY IR/ROXICODONE) 5 MG immediate release tablet Take 1-2 tablets (5-10 mg total) by mouth every 6 (six) hours as needed for severe pain. 01/29/16   Melrose Nakayama, MD  potassium chloride SA (K-DUR,KLOR-CON) 20 MEQ tablet Take 1 tablet (20 mEq total) by mouth daily. 01/22/16   John Giovanni, PA-C    Family History Family History  Problem Relation Age of Onset  . Bone cancer Father   . Colon cancer Neg Hx     Social History Social History  Substance Use Topics  . Smoking status: Former Smoker     Packs/day: 0.50    Years: 20.00    Types: Cigarettes    Quit date: 04/14/2001  . Smokeless tobacco: Never Used  . Alcohol use 3.6 oz/week    6 Cans of beer per week     Comment: weekends     Allergies   Aspirin   Review of Systems Review of Systems  Constitutional: Negative for fever.  HENT: Negative for congestion.   Eyes: Negative for visual disturbance.  Respiratory: Negative for shortness of breath.   Cardiovascular: Negative for chest pain.  Gastrointestinal: Negative for abdominal pain.  Genitourinary: Positive for flank pain. Negative for hematuria.  Musculoskeletal: Positive for back pain.  Skin: Negative for rash.  Neurological: Negative for headaches.  Hematological: Does not bruise/bleed easily.  Psychiatric/Behavioral: Negative for confusion.     Physical Exam Updated Vital Signs BP 104/71 (BP Location: Right Arm)   Pulse 75   Temp 97.5 F (36.4 C) (Oral)   Resp 16   Ht 5\' 6"  (1.676 m)   Wt 80.7 kg   SpO2 97%   BMI 28.73 kg/m   Physical Exam  Constitutional: He is oriented to person, place, and time. He appears well-developed and well-nourished. No distress.  HENT:  Head: Normocephalic and atraumatic.  Mouth/Throat: Oropharynx is clear and moist.  Eyes: Conjunctivae and EOM are normal. Pupils are equal, round, and reactive to light.  Neck: Normal range of motion. Neck supple.  Cardiovascular: Normal rate, regular rhythm and normal heart sounds.   Pulmonary/Chest: Effort normal and breath sounds normal. No respiratory distress. He has no wheezes. He has no rales.  Median sternotomy incision healing well.  Abdominal: Soft. Bowel sounds are normal. There is no tenderness.  Musculoskeletal: Normal range of motion. He exhibits no edema.  Neurological: He is alert and oriented to person, place, and time. No cranial nerve deficit. Coordination normal.  Skin: Skin is warm.  Nursing note and vitals reviewed.    ED Treatments / Results  Labs (all labs  ordered are listed, but only abnormal results are displayed) Labs Reviewed  URINALYSIS, ROUTINE W REFLEX MICROSCOPIC (NOT AT [facility]) - Abnormal; Notable for the following:       Result Value   Color, Urine AMBER (*)    Bilirubin Urine SMALL (*)    Ketones, ur 15 (*)    All other components within normal limits  CBC WITH DIFFERENTIAL/PLATELET - Abnormal; Notable for the following:    RBC 3.96 (*)    Hemoglobin 11.5 (*)    HCT 35.5 (*)    All other components within normal limits  COMPREHENSIVE METABOLIC PANEL - Abnormal; Notable for the following:    Glucose, Bld 107 (*)  All other components within normal limits    EKG  EKG Interpretation  Date/Time:  Tuesday February 05 2016 10:48:02 EDT Ventricular Rate:  80 PR Interval:  150 QRS Duration: 86 QT Interval:  406 QTC Calculation: 468 R Axis:   82 Text Interpretation:  Sinus rhythm with occasional Premature ventricular complexes Nonspecific T wave abnormality Prolonged QT Abnormal ECG Artifact Confirmed by Anastasios Melander  MD, Darling Cieslewicz ([id number]) on 02/05/2016 11:05:50 AM       Radiology Ct Angio Chest Pe W Or Wo Contrast  Result Date: 02/05/2016 CLINICAL DATA:  Acute chest and back pain, shortness of breath. EXAM: CT ANGIOGRAPHY CHEST WITH CONTRAST TECHNIQUE: Multidetector CT imaging of the chest was performed using the standard protocol during bolus administration of intravenous contrast. Multiplanar CT image reconstructions and MIPs were obtained to evaluate the vascular anatomy. CONTRAST:  100 mL of Isovue 370 intravenously. COMPARISON:  Chest radiograph of January 20, 2016. FINDINGS: Cardiovascular: There is no evidence of thoracic aortic aneurysm or dissection. Status post coronary artery bypass graft. There is no evidence of pulmonary embolus. Mediastinum/Nodes: No mediastinal mass or adenopathy is noted. Lungs/Pleura: No pneumothorax or pleural effusion is noted. Mild bilateral pleural effusions are noted with adjacent subsegmental  atelectasis. Upper Abdomen: Visualized portion of upper abdomen is unremarkable. Musculoskeletal: No significant osseous abnormality is noted. Review of the MIP images confirms the above findings. IMPRESSION: No definite evidence of pulmonary embolus. Mild bilateral pleural effusions are noted with adjacent subsegmental atelectasis. Electronically Signed   By: Marijo Conception, M.D.   On: 02/05/2016 15:27   Ct Renal Stone Study  Result Date: 02/05/2016 CLINICAL DATA:  Acute onset left flank pain this morning. Three weeks postop from CABG. EXAM: CT ABDOMEN AND PELVIS WITHOUT CONTRAST TECHNIQUE: Multidetector CT imaging of the abdomen and pelvis was performed following the standard protocol without IV contrast. COMPARISON:  None. FINDINGS: Lower chest: Tiny left pleural effusion and mild bilateral lower lobe atelectasis noted. Hepatobiliary: No mass visualized on this unenhanced exam. Gallbladder is unremarkable. Pancreas: No mass or inflammatory process visualized on this unenhanced exam. Spleen:  Within normal limits in size. Adrenals/Urinary tract: 7 mm calculus is seen in midpole of left kidney and 7 mm calculus is seen in lower pole of right kidney. No evidence of hydronephrosis. No evidence of ureteral calculi or dilatation. Unopacified urinary bladder is unremarkable in appearance. Stomach/Bowel: No evidence of obstruction, inflammatory process, or abnormal fluid collections. Colonic diverticulosis is noted, however there is no evidence of diverticulitis. Normal appendix visualized. Vascular/Lymphatic: No pathologically enlarged lymph nodes identified. No evidence of abdominal aortic aneurysm. Aortic atherosclerosis. Reproductive:  No mass or other significant abnormality. Other: Small bilateral inguinal hernias are seen containing only fat. No evidence of herniated bowel loops. Musculoskeletal:  No suspicious bone lesions identified. IMPRESSION: Bilateral nonobstructive renal calculi. No evidence of  ureteral calculi or hydronephrosis. Colonic diverticulosis. No radiographic evidence of diverticulitis. Small fat containing bilateral inguinal hernias. Tiny left pleural effusion and mild bilateral lower lobe atelectasis. Aortic atherosclerosis. Electronically Signed   By: Earle Gell M.D.   On: 02/05/2016 13:20    Procedures Procedures (including critical care time)  Medications Ordered in ED Medications  0.9 %  sodium chloride infusion ( Intravenous New Bag/Given 02/05/16 1251)  fentaNYL (SUBLIMAZE) injection 50 mcg (not administered)  ondansetron (ZOFRAN) injection 4 mg (4 mg Intravenous Given 02/05/16 1153)  fentaNYL (SUBLIMAZE) injection 50 mcg (50 mcg Intravenous Given 02/05/16 1251)  iopamidol (ISOVUE-370) 76 % injection 100 mL (100 mLs Intravenous  Contrast Given 02/05/16 1447)     Initial Impression / Assessment and Plan / ED Course  I have reviewed the triage vital signs and the nursing notes.  Pertinent labs & imaging results that were available during my care of the patient were reviewed by me and considered in my medical decision making (see chart for details).  Clinical Course   Patient's been sleeping in a recliner status post non-STEMI on October 3 and then CABG on October 5. Acute onset around 2 in the morning of left-sided low back pain. More left CVA area. Not really over the chest but at the margin of the ribs. Pain made worse by taking a deep breath. Perhaps maybe a little worse by certain movements. But not made worse by raising his left leg. No neuro focal deficits to the left leg. Not associated with nausea or vomiting. No dysuria.  Patient's workup urinalysis negative for any evidence urinary tract infection. Due to the severity of the pain thought it may be related to a renal stone. CT renal study was negative for any ureteral stones. Does have bilateral stones up in the kidneys.  Labs without any significant abnormalities.  But since it was near the rib margin  and patient is recently out of surgery CT UA and [place] just to completely rule out the possibility of a pulmonary embolus. Since pain is somewhat pleuritic in nature but an unusual location at the posterior rib margin. And the CVA area. If CT angios negative we'll supplement his normal pain medicine with oral hydromorphone and Flexeril.  chest x-ray was not obtained first.  CT angios negative for any evidence of pulmonary embolus. As a suspected there was small bilateral pleural effusions. Patient will be treated symptomatically.  Final Clinical Impressions(s) / ED Diagnoses   Final diagnoses:  Acute left-sided low back pain without sciatica    New Prescriptions New Prescriptions   CYCLOBENZAPRINE (FLEXERIL) 10 MG TABLET    Take 1 tablet (10 mg total) by mouth 2 (two) times daily as needed for muscle spasms.   HYDROMORPHONE (DILAUDID) 4 MG TABLET    Take 1 tablet (4 mg total) by mouth every 6 (six) hours as needed for severe pain.     Fredia Sorrow, MD 02/05/16 1513    Fredia Sorrow, MD 02/05/16 1534

## 2016-02-05 NOTE — ED Notes (Signed)
Patient gone to CT at this time.  

## 2016-02-05 NOTE — ED Notes (Signed)
Patient is alert and oriented x3.  He was given DC instructions and follow up visit instructions.  Patient gave verbal understanding.  He was DC ambulatory under his own power to home.  V/S stable.  He was not showing any signs of distress on DC 

## 2016-02-05 NOTE — Discharge Instructions (Signed)
Workup without any specific findings. Continue your current pain medication. Supplement with the hydromorphone as needed. Take the Flexeril as a muscle relaxer. Follow-up with your doctor. Return for any new or worse symptoms.

## 2016-02-08 ENCOUNTER — Ambulatory Visit (INDEPENDENT_AMBULATORY_CARE_PROVIDER_SITE_OTHER): Payer: BLUE CROSS/BLUE SHIELD | Admitting: Cardiology

## 2016-02-08 ENCOUNTER — Encounter: Payer: Self-pay | Admitting: Cardiology

## 2016-02-08 VITALS — BP 102/71 | HR 87 | Ht 66.0 in | Wt 182.2 lb

## 2016-02-08 DIAGNOSIS — E785 Hyperlipidemia, unspecified: Secondary | ICD-10-CM | POA: Diagnosis not present

## 2016-02-08 DIAGNOSIS — E784 Other hyperlipidemia: Secondary | ICD-10-CM | POA: Diagnosis not present

## 2016-02-08 DIAGNOSIS — Z951 Presence of aortocoronary bypass graft: Secondary | ICD-10-CM

## 2016-02-08 DIAGNOSIS — G4730 Sleep apnea, unspecified: Secondary | ICD-10-CM | POA: Diagnosis not present

## 2016-02-08 DIAGNOSIS — I214 Non-ST elevation (NSTEMI) myocardial infarction: Secondary | ICD-10-CM | POA: Diagnosis not present

## 2016-02-08 NOTE — Assessment & Plan Note (Signed)
   Left internal mammary graft to the LAD  Right internal mammary graft to the RCA  SVG to diagonal  SVG to OM2

## 2016-02-08 NOTE — Assessment & Plan Note (Signed)
F/U NSTEMI, CABG

## 2016-02-08 NOTE — Assessment & Plan Note (Signed)
On statin Rx 

## 2016-02-08 NOTE — Patient Instructions (Addendum)
Medication Instructions:  Your physician recommends that you continue on your current medications as directed. Please refer to the Current Medication list given to you today.  Labwork: Your physician recommends that you return for lab work in: 3 months fasting LIPID and CMET  Testing/Procedures: NONE   Follow-Up: Your physician recommends that you schedule a follow-up appointment in: [place].  Any Other Special Instructions Will Be Listed Below (If Applicable).  Complete labs 1 week before appt with Dr Gwenlyn Found    If you need a refill on your cardiac medications before your next appointment, please call your pharmacy.

## 2016-02-08 NOTE — Assessment & Plan Note (Signed)
C-pap intol 

## 2016-02-08 NOTE — Progress Notes (Signed)
02/08/2016 [name]   04/15/1960  [id number]  Primary Physician Luke Pel, MD Primary Cardiologist: Luke Luke Smith  HPI:  55 y/o male, seen by Luke Luke Smith in 2014 after an abnormal stress test. The pt questioned the results of that test. Luke Luke Smith felt he may have microvascular angina. He was not seen by cardiology again till Luke Smith referred him to [place] 11/29/15 for exertional chest pain. An exercise Myoview was done 12/05/15 and was negative. He followed up with Luke Luke Smith 01/15/16. The pt complained of increasing exertional chest pain and Luke Luke Smith set him up for a cath but in the interm the pt presented with ongoing chest pain to the ED later that evening. He ruled in for a NSTEMI-Troponin 10.26. He had a cath 01/16/16 that showed severe 3V CAD and an EF of 45%. He underwent CABG x 4 01/17/16 by Luke Smith and tolerated this well. Intra op TEE showed his EF to be 50%. He is in the office today for follow up. He was seen in the ED 01/17/16 with Lt flank pain felt to be M/S. He has not had recurrent chest pain.    Current Outpatient Prescriptions  Medication Sig Dispense Refill  . aspirin 81 MG EC tablet Take 81 mg by mouth daily.     Marland Kitchen atorvastatin (LIPITOR) 80 MG tablet Take 1 tablet (80 mg total) by mouth daily at 6 PM. 30 tablet 1  . cyclobenzaprine (FLEXERIL) 10 MG tablet Take 1 tablet (10 mg total) by mouth 2 (two) times daily as needed for muscle spasms. 20 tablet 0  . HYDROmorphone (DILAUDID) 4 MG tablet Take 1 tablet (4 mg total) by mouth every 6 (six) hours as needed for severe pain. 20 tablet 0  . Metoprolol Tartrate (LOPRESSOR) 50 MG tablet Take 1 tablet (50 mg total) by mouth 2 (two) times daily. 60 tablet 1  . oxyCODONE (OXY IR/ROXICODONE) 5 MG immediate release tablet Take 1-2 tablets (5-10 mg total) by mouth every 6 (six) hours as needed for severe pain. 30 tablet 0   No current facility-administered medications for this visit.     Allergies  Allergen Reactions  . Aspirin  Other (See Comments)    REACTION: acid reflux    Social History   Social History  . Marital status: Married    Spouse name: N/A  . Number of children: 2  . Years of education: N/A   Occupational History  . engineer    Social History Main Topics  . Smoking status: Former Smoker    Packs/day: 0.50    Years: 20.00    Types: Cigarettes    Quit date: 04/14/2001  . Smokeless tobacco: Never Used  . Alcohol use 3.6 oz/week    6 Cans of beer per week     Comment: weekends  . Drug use: No  . Sexual activity: Not on file   Other Topics Concern  . Not on file   Social History Narrative   Married for 37 years.  Works as a Chief Financial Officer for [employer]: General: negative for chills, fever, night sweats or weight changes.  Cardiovascular: negative for chest pain, dyspnea on exertion, edema, orthopnea, palpitations, paroxysmal nocturnal dyspnea or shortness of breath Dermatological: negative for rash Respiratory: negative for cough or wheezing Urologic: negative for hematuria Abdominal: negative for nausea, vomiting, diarrhea, bright red blood per rectum, melena, or hematemesis Neurologic: negative for visual changes, syncope, or dizziness All other systems reviewed  and are otherwise negative except as noted above.    Blood pressure 102/71, pulse 87, height 5\' 6"  (1.676 m), weight 182 lb 3.2 oz (82.6 kg).  General appearance: alert, cooperative and no distress Neck: no carotid bruit and no JVD Lungs: clear to auscultation bilaterally Heart: regular rate and rhythm Extremities: extremities normal, atraumatic, no cyanosis or edema Skin: Skin color, texture, turgor normal. No rashes or lesions Neurologic: Grossly normal  EKG NSR, [id number]  ASSESSMENT AND PLAN:   NSTEMI (non-ST elevated myocardial infarction) ([place]) F/U NSTEMI, CABG  Dyslipidemia On statin Rx  Sleep apnea C-pap intol  Hx of CABG 01/17/16  Left internal mammary graft to the  LAD  Right internal mammary graft to the RCA  SVG to diagonal  SVG to OM2  PLAN  F/U Luke Luke Smith in 3 months, check lipids and CMET prior to that  [employer] PA-C 02/08/2016 2:54 PM

## 2016-02-19 ENCOUNTER — Other Ambulatory Visit: Payer: Self-pay | Admitting: Surgery

## 2016-02-19 DIAGNOSIS — Z951 Presence of aortocoronary bypass graft: Secondary | ICD-10-CM

## 2016-02-20 ENCOUNTER — Ambulatory Visit
Admission: RE | Admit: 2016-02-20 | Discharge: 2016-02-20 | Disposition: A | Discharge status: Home / Self Care | Payer: BLUE CROSS/BLUE SHIELD | Source: Ambulatory Visit | Attending: Surgery | Admitting: Surgery

## 2016-02-20 ENCOUNTER — Encounter: Payer: Self-pay | Admitting: Surgery

## 2016-02-20 ENCOUNTER — Ambulatory Visit (INDEPENDENT_AMBULATORY_CARE_PROVIDER_SITE_OTHER): Payer: Self-pay | Admitting: Surgery

## 2016-02-20 VITALS — BP 120/82 | HR 80 | Resp 20 | Ht 66.0 in | Wt 182.0 lb

## 2016-02-20 DIAGNOSIS — Z951 Presence of aortocoronary bypass graft: Secondary | ICD-10-CM

## 2016-02-20 NOTE — Progress Notes (Signed)
     HPI: Patient returns for routine postoperative follow-up having undergone CABG x 4 on 01/17/2016. The patient's early postoperative recovery while in the hospital was notable for an uncomplicated postop course. Since hospital discharge the patient reports that he has been feeling well. He is ambulating without chest pain or dyspnea.   Current Outpatient Prescriptions  Medication Sig Dispense Refill  . aspirin 81 MG EC tablet Take 81 mg by mouth daily.     Marland Kitchen atorvastatin (LIPITOR) 80 MG tablet Take 1 tablet (80 mg total) by mouth daily at 6 PM. 30 tablet 1  . cyclobenzaprine (FLEXERIL) 10 MG tablet Take 1 tablet (10 mg total) by mouth 2 (two) times daily as needed for muscle spasms. 20 tablet 0  . HYDROmorphone (DILAUDID) 4 MG tablet Take 1 tablet (4 mg total) by mouth every 6 (six) hours as needed for severe pain. 20 tablet 0  . Metoprolol Tartrate (LOPRESSOR) 50 MG tablet Take 1 tablet (50 mg total) by mouth 2 (two) times daily. 60 tablet 1  . oxyCODONE (OXY IR/ROXICODONE) 5 MG immediate release tablet Take 1-2 tablets (5-10 mg total) by mouth every 6 (six) hours as needed for severe pain. 30 tablet 0   No current facility-administered medications for this visit.     Physical Exam: BP 120/82 (BP Location: Left Arm, Patient Position: Sitting, Cuff Size: Normal)   Pulse 80   Resp 20   Ht 5\' 6"  (1.676 m)   Wt 182 lb (82.6 kg)   SpO2 99% Comment: RA  BMI 29.38 kg/m  He looks well. Lung exam is clear. Cardiac exam shows a regular rate and rhythm with normal heart sounds. Chest incision is healing well and sternum is stable. The leg incisions are healing well and there is no peripheral edema.    Diagnostic Tests:  CLINICAL DATA:  Followup bypass surgery 01/17/2016  EXAM: CHEST  2 VIEW  COMPARISON:  01/20/2016  FINDINGS: The cardiac silhouette, mediastinal and hilar contours are within normal limits and stable. And stable surgical changes from bypass surgery. The  lungs are clear. There is a small residual left pleural effusion. The bony thorax is intact.  IMPRESSION: Status post coronary artery bypass surgery. No acute pulmonary findings or pulmonary edema.  Small residual left pleural effusion.   Electronically Signed   By: Marijo Sanes M.D.   On: 02/20/2016 11:05   Impression:  Overall I think he is doing well. I encouraged him to continue walking. He is planning to participate in cardiac rehab. I told him he could drive his car but should not lift anything heavier than 10 lbs for three months postop. His job requires heavy lifting of machinery and he is frequently traveling and staying outside the [place]. I think he will have to wait for three months to return to work.   Plan:  He will continue to follow up with cardiology and will return to see me if he has any problems with his incisions.   Gaye Pollack, MD [facility] [phone]

## 2016-03-12 DIAGNOSIS — Z736 Limitation of activities due to disability: Secondary | ICD-10-CM

## 2016-03-18 ENCOUNTER — Encounter (HOSPITAL_COMMUNITY): Payer: Self-pay

## 2016-03-18 ENCOUNTER — Encounter: Payer: Self-pay | Admitting: Cardiovascular Disease

## 2016-03-18 ENCOUNTER — Encounter (HOSPITAL_COMMUNITY)
Admission: RE | Admit: 2016-03-18 | Discharge: 2016-03-18 | Disposition: A | Discharge status: Home / Self Care | Payer: BLUE CROSS/BLUE SHIELD | Source: Ambulatory Visit | Attending: Cardiovascular Disease | Admitting: Cardiovascular Disease

## 2016-03-18 VITALS — BP 130/70 | HR 74 | Ht 69.25 in | Wt 182.1 lb

## 2016-03-18 DIAGNOSIS — I214 Non-ST elevation (NSTEMI) myocardial infarction: Secondary | ICD-10-CM | POA: Insufficient documentation

## 2016-03-18 DIAGNOSIS — Z951 Presence of aortocoronary bypass graft: Secondary | ICD-10-CM

## 2016-03-18 HISTORY — DX: Atherosclerotic heart disease of native coronary artery without angina pectoris: I25.10

## 2016-03-18 NOTE — Progress Notes (Signed)
Cardiac Rehab Medication Review by a Pharmacist  Does the patient  feel that his/her medications are working for him/her?  yes  Has the patient been experiencing any side effects to the medications prescribed?  no  Does the patient measure his/her own blood pressure or blood glucose at home?  no   Does the patient have any problems obtaining medications due to transportation or finances?   no  Understanding of regimen: good Understanding of indications: fair Potential of compliance: good   Pharmacist comments: Patient reported taking his lopressor once daily for the last couple weeks but just realized he had read the instructions wrong and so has restarted taking it twice daily.   Reginia Naas 03/18/2016 3:00 PM

## 2016-03-19 NOTE — Progress Notes (Signed)
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  aspirin 81 MG EC tablet, Take 81 mg by mouth daily. , Disp: , Rfl:  .  cholecalciferol (VITAMIN D) 1000 units tablet, Take 1,000 Units by mouth daily., Disp: , Rfl:  .  Metoprolol Tartrate (LOPRESSOR) 50 MG tablet, Take 1 tablet (50 mg total) by mouth 2 (two) times daily., Disp: 60 tablet, Rfl: 1 .  omega-3 acid ethyl esters (LOVAZA) 1 g capsule, Take 500 mg by mouth daily., Disp: , Rfl:  .  atorvastatin (LIPITOR) 80 MG tablet, Take 1 tablet (80 mg total) by mouth daily at 6 PM., Disp: 30 tablet, Rfl: 1 .  cyclobenzaprine (FLEXERIL) 10 MG tablet, Take 1 tablet (10 mg total) by mouth 2 (two) times daily as needed for muscle spasms., Disp: 20 tablet, Rfl: 0 .  HYDROcodone-acetaminophen (NORCO/VICODIN) 5-325 MG tablet, Take 1 tablet by mouth every 6 (six) hours as needed for moderate pain., Disp: , Rfl:   Past Medical History: Past Medical History:  Diagnosis Date  . Coronary artery disease   . GERD (gastroesophageal reflux disease)   . Hypertension   . MI (myocardial infarction)   . Perforated ear drum    left ear  . Sleep apnea     Tobacco Use: History  Smoking Status  . Former Smoker  . Packs/day: 0.50  . Years: 20.00  . Types: Cigarettes  . Quit date: 04/14/2001  Smokeless Tobacco  . Never Used     Labs: Recent Review Flowsheet Data    Labs for ITP Cardiac and Pulmonary Rehab Latest Ref Rng & Units 01/17/2016 01/17/2016 01/17/2016 01/18/2016 01/18/2016   Cholestrol 0 - 200 mg/dL - - - - -   LDLCALC 0 - 99 mg/dL - - - - -   HDL >40 mg/dL - - - - -   Trlycerides <150 mg/dL - - - - -   Hemoglobin A1c 4.8 - 5.6 % - - - - -   PHART 7.350 - 7.450 - 7.374 7.309(L) 7.348(L) -   PCO2ART 32.0 - 48.0 mmHg - 41.6 48.0 43.2 -   HCO3 20.0 - 28.0 mmol/L - 24.3 23.9 23.6 -   TCO2 0 - 100 mmol/L 24 26 25 25 26    ACIDBASEDEF 0.0 - 2.0 mmol/L - 1.0 2.0 2.0 -   O2SAT % - 97.0 92.0 94.0 -      Capillary Blood Glucose: Lab Results  Component Value Date   GLUCAP 102 (H) 01/22/2016   GLUCAP 118 (H) 01/22/2016   GLUCAP 119 (H) 01/21/2016   GLUCAP 142 (H) 01/21/2016   GLUCAP 97 01/21/2016     Exercise Target Goals: Date: 03/18/16  Exercise Program Goal: Individual exercise prescription set with THRR, safety & activity barriers. Participant demonstrates ability to understand and report RPE using BORG scale, to self-measure pulse accurately, and to acknowledge the importance of the exercise prescription.  Exercise Prescription Goal: Starting with aerobic activity 30 plus minutes a day, 3 days per week for initial exercise prescription. Provide home exercise prescription and guidelines that participant acknowledges understanding prior to discharge.  Activity Barriers & Risk Stratification:     Activity Barriers & Cardiac Risk Stratification - 03/18/16 1415      Activity Barriers & Cardiac Risk Stratification   Activity Barriers Back Problems;Arthritis   Cardiac Risk Stratification High      6 Minute Walk:     6 Minute Walk    Row Name 03/18/16 1532 03/18/16 1618       6 Minute Walk   Phase Initial  -    Distance 1416 feet  -    Walk Time 6 minutes  -    # of Rest Breaks  - 0    MPH  - 2.68    METS  - 3.92    RPE 11  -    VO2 Peak  - 13.73    Symptoms  - No    Resting HR 74  bpm  -    Resting BP 130/70  -    Max Ex. HR 92 bpm  -    Max Ex. BP 122/82  -    2 Minute Post BP 124/50  -       Initial Exercise Prescription:     Initial Exercise Prescription - 03/18/16 1600      Date of Initial Exercise RX and Referring Provider   Date 03/18/16   Referring Provider Ancil Linsey MD     Treadmill   MPH 2.8   Grade 1   Minutes 10   METs 3.53     Bike   Level 0.8   Minutes 10   METs 2.89     NuStep   Level 3   Minutes 10   METs 2     Prescription Details   Frequency (times per week) 3   Duration Progress to 30 minutes of continuous aerobic without signs/symptoms of physical distress     Intensity   THRR 40-80% of Max Heartrate 66-133   Ratings of Perceived Exertion 11-13     Progression   Progression Continue to progress workloads to maintain intensity without signs/symptoms of physical distress.     Resistance Training   Training Prescription Yes   Weight 3lbs   Reps 10-12      Perform Capillary Blood Glucose checks as needed.  Exercise Prescription Changes:   Exercise Comments:   Discharge Exercise Prescription (Final Exercise Prescription Changes):   Nutrition:  Target Goals: Understanding of nutrition guidelines, daily intake of sodium 1500mg , cholesterol 200mg , calories 30% from fat and 7% or less from saturated fats, daily to have 5 or more servings of fruits and vegetables.  Biometrics:     Pre Biometrics - 03/18/16 1619      Pre Biometrics   Waist Circumference 37.5 inches   Hip Circumference 40 inches   Waist to Hip Ratio 0.94 %   % Body Fat 23.4 %       Nutrition Therapy Plan and Nutrition Goals:   Nutrition Discharge: Nutrition Scores:   Nutrition Goals Re-Evaluation:   Psychosocial: Target Goals: Acknowledge presence or absence of depression, maximize coping skills, provide positive support system. Participant is able to verbalize types and ability to use techniques and skills needed for  reducing stress and depression.  Initial Review & Psychosocial Screening:     Initial Psych Review & Screening - 03/18/16  [place]? Yes     Barriers   Psychosocial barriers to participate in program There are no identifiable barriers or psychosocial needs.     Screening Interventions   Interventions Encouraged to exercise      Quality of Life Scores:     Quality of Life - 03/18/16 1634      Quality of Life Scores   Health/Function Pre 17.43 %   Socioeconomic Pre 18.86 %   Psych/Spiritual Pre 19.43 %   Family Pre 22.8 %   GLOBAL Pre 18.93 %      PHQ-9: Recent Review Flowsheet Data    There is no flowsheet data to display.      Psychosocial Evaluation and Intervention:   Psychosocial Re-Evaluation:   Vocational Rehabilitation: Provide vocational rehab assistance to qualifying candidates.   Vocational Rehab Evaluation & Intervention:     Vocational Rehab - 03/18/16 1530      Initial Vocational Rehab Evaluation & Intervention   Assessment shows need for Vocational Rehabilitation No      Education: Education Goals: Education classes will be provided on a weekly basis, covering required topics. Participant will state understanding/return demonstration of topics presented.  Learning Barriers/Preferences:     Learning Barriers/Preferences - 03/18/16 1414      Learning Barriers/Preferences   Learning Barriers None   Learning Preferences Skilled Demonstration      Education Topics: Count Your Pulse:  -Group instruction provided by verbal instruction, demonstration, patient participation and written materials to support subject.  Instructors address importance of being able to find your pulse and how to count your pulse when at home without a heart monitor.  Patients get hands on experience counting their pulse with staff help and individually.   Heart Attack, Angina, and Risk Factor Modification:  -Group  instruction provided by verbal instruction, video, and written materials to support subject.  Instructors address signs and symptoms of angina and heart attacks.    Also discuss risk factors for heart disease and how to make changes to improve heart health risk factors.   Functional Fitness:  -Group instruction provided by verbal instruction, demonstration, patient participation, and written materials to support subject.  Instructors address safety measures for doing things around the house.  Discuss how to get up and down off the floor, how to pick things up properly, how to safely get out of a chair without assistance, and balance training.   Meditation and Mindfulness:  -Group instruction provided by verbal instruction, patient participation, and written materials to support subject.  Instructor addresses importance of mindfulness and meditation practice to help reduce stress and improve awareness.  Instructor also leads participants through a meditation exercise.    Stretching for Flexibility and Mobility:  -Group instruction provided by verbal instruction, patient participation, and written materials to support subject.  Instructors lead participants through series of stretches that are designed to increase flexibility thus improving mobility.  These stretches are additional exercise for major muscle groups that are typically performed during regular warm up and cool down.   Hands Only CPR Anytime:  -Group instruction provided by verbal instruction, video, patient participation and written materials to support subject.  Instructors co-teach with AHA video for hands only CPR.  Participants get hands on experience with mannequins.   Nutrition I class: Heart Healthy Eating:  -Group instruction provided by PowerPoint slides, verbal discussion, and written materials to support subject matter. The instructor gives an explanation and  review of the Therapeutic Lifestyle Changes diet recommendations,  which includes a discussion on lipid goals, dietary fat, sodium, fiber, plant stanol/sterol esters, sugar, and the components of a well-balanced, healthy diet.   Nutrition II class: Lifestyle Skills:  -Group instruction provided by PowerPoint slides, verbal discussion, and written materials to support subject matter. The instructor gives an explanation and review of label reading, grocery shopping for heart health, heart healthy recipe modifications, and ways to make healthier choices when eating out.   Diabetes Question & Answer:  -Group instruction provided by PowerPoint slides, verbal discussion, and written materials to support subject matter. The instructor gives an explanation and review of diabetes co-morbidities, pre- and post-prandial blood glucose goals, pre-exercise blood glucose goals, signs, symptoms, and treatment of hypoglycemia and hyperglycemia, and foot care basics.   Diabetes Blitz:  -Group instruction provided by PowerPoint slides, verbal discussion, and written materials to support subject matter. The instructor gives an explanation and review of the physiology behind type 1 and type 2 diabetes, diabetes medications and rational behind using different medications, pre- and post-prandial blood glucose recommendations and Hemoglobin A1c goals, diabetes diet, and exercise including blood glucose guidelines for exercising safely.    Portion Distortion:  -Group instruction provided by PowerPoint slides, verbal discussion, written materials, and food models to support subject matter. The instructor gives an explanation of serving size versus portion size, changes in portions sizes over the last 20 years, and what consists of a serving from each food group.   Stress Management:  -Group instruction provided by verbal instruction, video, and written materials to support subject matter.  Instructors review role of stress in heart disease and how to cope with stress positively.      Exercising on Your Own:  -Group instruction provided by verbal instruction, power point, and written materials to support subject.  Instructors discuss benefits of exercise, components of exercise, frequency and intensity of exercise, and end points for exercise.  Also discuss use of nitroglycerin and activating EMS.  Review options of places to exercise outside of rehab.  Review guidelines for sex with heart disease.   Cardiac Drugs I:  -Group instruction provided by verbal instruction and written materials to support subject.  Instructor reviews cardiac drug classes: antiplatelets, anticoagulants, beta blockers, and statins.  Instructor discusses reasons, side effects, and lifestyle considerations for each drug class.   Cardiac Drugs II:  -Group instruction provided by verbal instruction and written materials to support subject.  Instructor reviews cardiac drug classes: angiotensin converting enzyme inhibitors (ACE-I), angiotensin II receptor blockers (ARBs), nitrates, and calcium channel blockers.  Instructor discusses reasons, side effects, and lifestyle considerations for each drug class.   Anatomy and Physiology of the Circulatory System:  -Group instruction provided by verbal instruction, video, and written materials to support subject.  Reviews functional anatomy of heart, how it relates to various diagnoses, and what role the heart plays in the overall system.   Knowledge Questionnaire Score:     Knowledge Questionnaire Score - 03/18/16 1530      Knowledge Questionnaire Score   Pre Score 21/24      Core Components/Risk Factors/Patient Goals at Admission:     Personal Goals and Risk Factors at Admission - 03/18/16 1415      Core Components/Risk Factors/Patient Goals on Admission    Weight Management Yes;Weight Loss   Intervention Weight Management: Develop a combined nutrition and exercise program designed to reach desired caloric intake, while maintaining appropriate  intake of nutrient and fiber, sodium  and fats, and appropriate energy expenditure required for the weight goal.;Weight Management/Obesity: Establish reasonable short term and long term weight goals.;Weight Management: Provide education and appropriate resources to help participant work on and attain dietary goals.;Obesity: Provide education and appropriate resources to help participant work on and attain dietary goals.   Expected Outcomes Short Term: Continue to assess and modify interventions until short term weight is achieved;Long Term: Adherence to nutrition and physical activity/exercise program aimed toward attainment of established weight goal;Weight Maintenance: Understanding of the daily nutrition guidelines, which includes 25-35% calories from fat, 7% or less cal from saturated fats, less than 200mg  cholesterol, less than 1.5gm of sodium, & 5 or more servings of fruits and vegetables daily;Weight Loss: Understanding of general recommendations for a balanced deficit meal plan, which promotes 1-2 lb weight loss per week and includes a negative energy balance of [phone] kcal/d;Understanding recommendations for meals to include 15-35% energy as protein, 25-35% energy from fat, 35-60% energy from carbohydrates, less than 200mg  of dietary cholesterol, 20-35 gm of total fiber daily;Understanding of distribution of calorie intake throughout the day with the consumption of 4-5 meals/snacks;Weight Gain: Understanding of general recommendations for a high calorie, high protein meal plan that promotes weight gain by distributing calorie intake throughout the day with the consumption for 4-5 meals, snacks, and/or supplements   Increase Strength and Stamina Yes   Intervention Provide advice, education, support and counseling about physical activity/exercise needs.;Develop an individualized exercise prescription for aerobic and resistive training based on initial evaluation findings, risk stratification,  comorbidities and participant's personal goals.   Expected Outcomes Achievement of increased cardiorespiratory fitness and enhanced flexibility, muscular endurance and strength shown through measurements of functional capacity and personal statement of participant.   Improve shortness of breath with ADL's Yes   Intervention Provide education, individualized exercise plan and daily activity instruction to help decrease symptoms of SOB with activities of daily living.   Expected Outcomes Short Term: Achieves a reduction of symptoms when performing activities of daily living.   Personal Goal Other Yes   Personal Goal Get back into shape and learn more about nutrtion   Intervention Provide nutrition education and exercise program to asssit with improving cardiovascular fitness and overall health and well-being   Expected Outcomes Pt will make necessary dietary changes and develop an exercise routine      Core Components/Risk Factors/Patient Goals Review:    Core Components/Risk Factors/Patient Goals at Discharge (Final Review):    ITP Comments:     ITP Comments    Row Name 03/18/16 1327           ITP Comments Medical Director- Dr. Fransico Him, MD.          Comments: Patient attended orientation from 1330  to 1500 to review rules and guidelines for program. Completed 6 minute walk test, Intitial ITP, and exercise prescription.  VSS. Telemetry-Sinus rhythm with arrhythmia  Rare PAC.  Asymptomatic.Barnet Pall, RN,BSN 03/19/2016 9:21 AM

## 2016-03-24 ENCOUNTER — Encounter (HOSPITAL_COMMUNITY)
Admission: RE | Admit: 2016-03-24 | Discharge: 2016-03-24 | Disposition: A | Discharge status: Home / Self Care | Payer: BLUE CROSS/BLUE SHIELD | Source: Ambulatory Visit | Attending: Cardiovascular Disease | Admitting: Cardiovascular Disease

## 2016-03-24 DIAGNOSIS — I214 Non-ST elevation (NSTEMI) myocardial infarction: Secondary | ICD-10-CM | POA: Diagnosis not present

## 2016-03-24 DIAGNOSIS — Z951 Presence of aortocoronary bypass graft: Secondary | ICD-10-CM

## 2016-03-24 NOTE — Progress Notes (Signed)
Daily Session Note  Patient Details  Name: Luke Smith MRN: [id number] Date of Birth: 05/05/1960 Referring Provider:   Flowsheet Row CARDIAC REHAB PHASE II ORIENTATION from 03/18/2016 in [facility]  Referring Provider  Berry, Jonathon MD      Encounter Date: 03/24/2016  Check In:   Capillary Blood Glucose: No results found for this or any previous visit (from the past 24 hour(s)).   Goals Met:  Exercise tolerated well  Goals Unmet:  Not Applicable  Comments: [name] started cardiac rehab today.  Pt tolerated light exercise without difficulty. VSS, telemetry-Sinus , asymptomatic.  Medication list reconciled. Pt denies barriers to medicaiton compliance.  PSYCHOSOCIAL ASSESSMENT:  PHQ-0. Pt exhibits positive coping skills, hopeful outlook with supportive family. No psychosocial needs identified at this time, no psychosocial interventions necessary.  [name] told me his father in law passed away one month ago.  Pt enjoys playing golf and swimming.   Pt oriented to exercise equipment and routine.    Understanding verbalized. [name] blood pressure was 142/94. [name] told me he forgot to take his metoprolol this morning. After resting his recheck blood pressure was 124/86. [name] told me he will take his metoprolol before coming to exercise on Wednesday.[name], RN,BSN 03/24/2016 12:01 PM   Dr. Traci Turner is Medical Director for Cardiac Rehab at [facility]. 

## 2016-03-26 ENCOUNTER — Encounter (HOSPITAL_COMMUNITY)
Admission: RE | Admit: 2016-03-26 | Discharge: 2016-03-26 | Disposition: A | Discharge status: Home / Self Care | Payer: BLUE CROSS/BLUE SHIELD | Source: Ambulatory Visit | Attending: Cardiovascular Disease | Admitting: Cardiovascular Disease

## 2016-03-26 DIAGNOSIS — I214 Non-ST elevation (NSTEMI) myocardial infarction: Secondary | ICD-10-CM | POA: Diagnosis not present

## 2016-03-26 DIAGNOSIS — Z951 Presence of aortocoronary bypass graft: Secondary | ICD-10-CM

## 2016-03-28 ENCOUNTER — Encounter (HOSPITAL_COMMUNITY)
Admission: RE | Admit: 2016-03-28 | Discharge: 2016-03-28 | Disposition: A | Discharge status: Home / Self Care | Payer: BLUE CROSS/BLUE SHIELD | Source: Ambulatory Visit | Attending: Cardiovascular Disease | Admitting: Cardiovascular Disease

## 2016-03-28 DIAGNOSIS — I214 Non-ST elevation (NSTEMI) myocardial infarction: Secondary | ICD-10-CM

## 2016-03-28 DIAGNOSIS — Z951 Presence of aortocoronary bypass graft: Secondary | ICD-10-CM

## 2016-03-31 ENCOUNTER — Encounter (HOSPITAL_COMMUNITY)
Admission: RE | Admit: 2016-03-31 | Discharge: 2016-03-31 | Disposition: A | Discharge status: Home / Self Care | Payer: BLUE CROSS/BLUE SHIELD | Source: Ambulatory Visit | Attending: Cardiovascular Disease | Admitting: Cardiovascular Disease

## 2016-03-31 DIAGNOSIS — I214 Non-ST elevation (NSTEMI) myocardial infarction: Secondary | ICD-10-CM | POA: Diagnosis not present

## 2016-03-31 DIAGNOSIS — Z951 Presence of aortocoronary bypass graft: Secondary | ICD-10-CM

## 2016-03-31 NOTE — Progress Notes (Signed)
Reviewed Home Exercise Program with pt.  Discussed mode/frequency/duration of exercise, THRR, RPE and weather conditions for exercising outdoors.  Also discussed signs and symptoms and when to call Dr/911.  Pt verbalized understanding.  Cleda Mccreedy, MS 03/31/2016 12:10

## 2016-04-02 ENCOUNTER — Encounter (HOSPITAL_COMMUNITY)
Admission: RE | Admit: 2016-04-02 | Discharge: 2016-04-02 | Disposition: A | Discharge status: Home / Self Care | Payer: BLUE CROSS/BLUE SHIELD | Source: Ambulatory Visit | Attending: Cardiovascular Disease | Admitting: Cardiovascular Disease

## 2016-04-02 DIAGNOSIS — I214 Non-ST elevation (NSTEMI) myocardial infarction: Secondary | ICD-10-CM | POA: Diagnosis not present

## 2016-04-02 DIAGNOSIS — Z951 Presence of aortocoronary bypass graft: Secondary | ICD-10-CM

## 2016-04-03 NOTE — Progress Notes (Signed)
QUALITY OF LIFE SCORE REVIEW  Pt completed Quality of Life survey as a participant in Cardiac Rehab. Scores 21.0 or below are considered low. Pt score very low in several areas Overall 18.93, Health and Function 17.43, socioeconomic 18.86, physiological and spiritual 19.43, family 22.80. Patient quality of life slightly altered by physical constraints which limits ability to perform as prior to recent cardiac illness.  Luke Smith reports feeling better about his health and plans to return to work the first of the year.  Offered emotional support and reassurance.  Will continue to monitor and intervene as necessary.  Will fax today's quality of life questionnaire to Dr Kennon Holter office for review. Barnet Pall, RN,BSN 04/03/2016 10:37 AM

## 2016-04-04 ENCOUNTER — Encounter (HOSPITAL_COMMUNITY)
Admission: RE | Admit: 2016-04-04 | Discharge: 2016-04-04 | Disposition: A | Discharge status: Home / Self Care | Payer: BLUE CROSS/BLUE SHIELD | Source: Ambulatory Visit | Attending: Cardiovascular Disease | Admitting: Cardiovascular Disease

## 2016-04-04 DIAGNOSIS — Z951 Presence of aortocoronary bypass graft: Secondary | ICD-10-CM

## 2016-04-04 DIAGNOSIS — I214 Non-ST elevation (NSTEMI) myocardial infarction: Secondary | ICD-10-CM

## 2016-04-07 ENCOUNTER — Encounter (HOSPITAL_COMMUNITY): Payer: BLUE CROSS/BLUE SHIELD

## 2016-04-09 ENCOUNTER — Other Ambulatory Visit: Payer: Self-pay | Admitting: Cardiovascular Disease

## 2016-04-09 ENCOUNTER — Encounter (HOSPITAL_COMMUNITY): Payer: BLUE CROSS/BLUE SHIELD

## 2016-04-11 ENCOUNTER — Encounter (HOSPITAL_COMMUNITY)
Admission: RE | Admit: 2016-04-11 | Discharge: 2016-04-11 | Disposition: A | Discharge status: Home / Self Care | Payer: BLUE CROSS/BLUE SHIELD | Source: Ambulatory Visit | Attending: Cardiovascular Disease | Admitting: Cardiovascular Disease

## 2016-04-11 VITALS — Ht 69.25 in | Wt 186.3 lb

## 2016-04-11 DIAGNOSIS — I214 Non-ST elevation (NSTEMI) myocardial infarction: Secondary | ICD-10-CM

## 2016-04-11 DIAGNOSIS — Z951 Presence of aortocoronary bypass graft: Secondary | ICD-10-CM

## 2016-04-11 NOTE — Progress Notes (Signed)
[name] 55 y.o. male Nutrition Note Spoke with pt. Nutrition Survey reviewed with pt. Pt is following Step 1 of the Therapeutic Lifestyle Changes diet. Pt expressed understanding of the information reviewed. Pt aware of nutrition education classes offered. Lab Results  Component Value Date   HGBA1C 5.5 01/16/2016   Wt Readings from Last 3 Encounters:  03/18/16 182 lb 1.6 oz (82.6 kg)  02/20/16 182 lb (82.6 kg)  02/08/16 182 lb 3.2 oz (82.6 kg)   Nutrition Diagnosis ? Food-and nutrition-related knowledge deficit related to lack of exposure to information as related to diagnosis of: ? CVD ? Overweight related to excessive energy intake as evidenced by a BMI of 26.8  Nutrition Intervention ? Benefits of adopting Therapeutic Lifestyle Changes discussed when Medficts reviewed. ? Pt to attend the Portion Distortion class ? Pt to attend the  ? Nutrition I class                      ? Nutrition II class - met 03/25/16 ? Pt given handouts for: ? Nutrition I class  ? Continue client-centered nutrition education by RD, as part of interdisciplinary care.  Goal(s) ? Pt to identify food quantities necessary to achieve weight loss of 6-24 lb (2.7-10.9 kg) at graduation from cardiac rehab.   Monitor and Evaluate progress toward nutrition goal with team.  Derek Mound, M.Ed, RD, LDN, CDE 04/11/2016 10:45 AM

## 2016-04-11 NOTE — Progress Notes (Signed)
Discharge Summary  Patient Details  Name: Luke Smith MRN: [id number] Date of Birth: [birth year] Referring Provider:   Flowsheet Row CARDIAC REHAB PHASE II ORIENTATION from 03/18/2016 in [place]  Referring Provider  Ancil Linsey MD       Number of Visits: 8  Reason for Discharge:  Early Exit:  Back to work  Smoking History:  History  Smoking Status  . Former Smoker  . Packs/day: 0.50  . Years: 20.00  . Types: Cigarettes  . Quit date: 04/14/2001  Smokeless Tobacco  . 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  ADL UCSD:   Initial Exercise Prescription:     Initial Exercise Prescription - 03/18/16 1600      Date of Initial Exercise RX and Referring Provider   Date 03/18/16   Referring Provider Ancil Linsey MD     Treadmill   MPH 2.8   Grade 1   Minutes 10   METs 3.53     Bike   Level 0.8   Minutes 10   METs 2.89     NuStep   Level 3   Minutes 10   METs 2     Prescription Details   Frequency (times per week) 3   Duration Progress to 30 minutes of continuous aerobic without signs/symptoms of physical distress     Intensity   THRR 40-80% of Max Heartrate 66-133   Ratings of Perceived Exertion 11-13     Progression   Progression Continue to progress workloads to maintain intensity without signs/symptoms of physical distress.     Resistance Training   Training Prescription Yes   Weight 3lbs   Reps 10-12      Discharge Exercise Prescription (Final Exercise Prescription Changes):     Exercise Prescription Changes - 04/24/16 1600      Exercise Review   Progression Yes     Response to Exercise   Blood Pressure (Admit) 114/72   Blood Pressure (Exercise) 122/76   Blood Pressure (Exit) 137/88   Heart Rate (Admit) 70 bpm   Heart Rate (Exercise) 99 bpm   Heart Rate (Exit) 79 bpm   Rating of Perceived Exertion (Exercise) 13   Duration  Progress to 45 minutes of aerobic exercise without signs/symptoms of physical distress   Intensity THRR unchanged     Progression   Progression Continue to progress workloads to maintain intensity without signs/symptoms of physical distress.   Average METs 3.4     Resistance Training   Training Prescription Yes   Weight 5lbs   Reps 10-12     Treadmill   MPH 3   Grade 1   Minutes 10   METs 3.7     Bike   Level 1   Minutes 10   METs 3.3     NuStep   Level 4   Minutes 10   METs 3.8     Home Exercise Plan   Plans to continue exercise at Home   Frequency Add 3 additional days to program exercise sessions.      Functional Capacity:     6 Minute Walk    Row Name 03/18/16 1532 03/18/16 1618 04/24/16 1649     6 Minute Walk   Phase Initial  - Discharge   Distance 1416 feet  - 2026 feet   Distance % Change  -  - 43.1 %   Walk Time 6 minutes  -  6 minutes   # of Rest Breaks  - 0 0   MPH  - 2.68 3.8   METS  - 3.92 5.4   RPE 11  - 13   VO2 Peak  - 13.73 19   Symptoms  - No No   Resting HR 74 bpm  - 75 bpm   Resting BP 130/70  - 140/80   Max Ex. HR 92 bpm  - 104 bpm   Max Ex. BP 122/82  - 164/84   2 Minute Post BP 124/50  - 142/82      Psychological, QOL, Others - Outcomes: PHQ 2/9: Depression screen [facility] 2/9 04/11/2016 03/24/2016  Decreased Interest 0 0  Down, Depressed, Hopeless 0 0  PHQ - 2 Score 0 0    Quality of Life:     Quality of Life - 04/24/16 1654      Quality of Life Scores   Health/Function Pre 17.43 %   Health/Function Post 22.5 %   Health/Function % Change 29.09 %   Socioeconomic Pre 18.86 %   Socioeconomic Post 22.5 %   Socioeconomic % Change  19.3 %   Psych/Spiritual Pre 19.43 %   Psych/Spiritual Post 22.5 %   Psych/Spiritual % Change 15.8 %   Family Pre 22.8 %   Family Post 22.5 %   Family % Change -1.32 %   GLOBAL Pre 18.93 %   GLOBAL Post 22.5 %   GLOBAL % Change 18.86 %      Personal Goals: Goals established at  orientation with interventions provided to work toward goal.     Personal Goals and Risk Factors at Admission - 03/18/16 1415      Core Components/Risk Factors/Patient Goals on Admission    Weight Management Yes;Weight Loss   Intervention Weight Management: Develop a combined nutrition and exercise program designed to reach desired caloric intake, while maintaining appropriate intake of nutrient and fiber, sodium and fats, and appropriate energy expenditure required for the weight goal.;Weight Management/Obesity: Establish reasonable short term and long term weight goals.;Weight Management: Provide education and appropriate resources to help participant work on and attain dietary goals.;Obesity: Provide education and appropriate resources to help participant work on and attain dietary goals.   Expected Outcomes Short Term: Continue to assess and modify interventions until short term weight is achieved;Long Term: Adherence to nutrition and physical activity/exercise program aimed toward attainment of established weight goal;Weight Maintenance: Understanding of the daily nutrition guidelines, which includes 25-35% calories from fat, 7% or less cal from saturated fats, less than 282m cholesterol, less than 1.5gm of sodium, & 5 or more servings of fruits and vegetables daily;Weight Loss: Understanding of general recommendations for a balanced deficit meal plan, which promotes 1-2 lb weight loss per week and includes a negative energy balance of [phone] kcal/d;Understanding recommendations for meals to include 15-35% energy as protein, 25-35% energy from fat, 35-60% energy from carbohydrates, less than 2064mof dietary cholesterol, 20-35 gm of total fiber daily;Understanding of distribution of calorie intake throughout the day with the consumption of 4-5 meals/snacks;Weight Gain: Understanding of general recommendations for a high calorie, high protein meal plan that promotes weight gain by distributing calorie  intake throughout the day with the consumption for 4-5 meals, snacks, and/or supplements   Increase Strength and Stamina Yes   Intervention Provide advice, education, support and counseling about physical activity/exercise needs.;Develop an individualized exercise prescription for aerobic and resistive training based on initial evaluation findings, risk stratification, comorbidities and participant's personal goals.  Expected Outcomes Achievement of increased cardiorespiratory fitness and enhanced flexibility, muscular endurance and strength shown through measurements of functional capacity and personal statement of participant.   Improve shortness of breath with ADL's Yes   Intervention Provide education, individualized exercise plan and daily activity instruction to help decrease symptoms of SOB with activities of daily living.   Expected Outcomes Short Term: Achieves a reduction of symptoms when performing activities of daily living.   Personal Goal Other Yes   Personal Goal Get back into shape and learn more about nutrtion   Intervention Provide nutrition education and exercise program to asssit with improving cardiovascular fitness and overall health and well-being   Expected Outcomes Pt will make necessary dietary changes and develop an exercise routine       Personal Goals Discharge:     Goals and Risk Factor Review    Row Name 04/04/16 1227             Core Components/Risk Factors/Patient Goals Review   Personal Goals Review (P)  Increase Strength and Stamina          Nutrition & Weight - Outcomes:     Pre Biometrics - 03/18/16 1619      Pre Biometrics   Waist Circumference 37.5 inches   Hip Circumference 40 inches   Waist to Hip Ratio 0.94 %   % Body Fat 23.4 %         Post Biometrics - 04/24/16 1653       Post  Biometrics   Height 5' 9.25" (1.759 m)   Weight 186 lb 4.6 oz (84.5 kg)   Waist Circumference 39 inches   Hip Circumference 40 inches   Waist to  Hip Ratio 0.98 %   BMI (Calculated) 27.4   Triceps Skinfold 11 mm   % Body Fat 25.1 %   Grip Strength 48 kg   Flexibility 14.5 in   Single Leg Stand 30 seconds      Nutrition:     Nutrition Therapy & Goals - 03/24/16 1125      Nutrition Therapy   Diet Carb Modified, Therapeutic Lifestyle Changes     Personal Nutrition Goals   Personal Goal #1 Wt loss of 1-2 lb/week to a wt loss goal of 6-24 lb at graduation from [place], educate and counsel regarding individualized specific dietary modifications aiming towards targeted core components such as weight, hypertension, lipid management, diabetes, heart failure and other comorbidities.   Expected Outcomes Short Term Goal: Understand basic principles of dietary content, such as calories, fat, sodium, cholesterol and nutrients.;Long Term Goal: Adherence to prescribed nutrition plan.      Nutrition Discharge:     Nutrition Assessments - 04/28/16 1518      MEDFICTS Scores   Pre Score 42   Post Score 42   Score Difference 0      Education Questionnaire Score:     Knowledge Questionnaire Score - 04/24/16 1649      Knowledge Questionnaire Score   Post Score 24/24      Goals reviewed with patient; copy given to patient.Luke Smith graduates from cardiac rehab program today with completion of 8 exercise sessions in Phase II. Pt maintained good attendance and progressed nicely during his participation in rehab as evidenced by increased MET level.   Medication list reconciled. Repeat  PHQ score- 0 .  Luke Smith has made significant lifestyle changes and should be commended for his success. Pt feels  he has achieved his goals during cardiac rehab.   Pt plans to continue exercise at a local gym. Luke Smith completed the program early so he can return to work. Luke Gave RN BSN

## 2016-04-14 ENCOUNTER — Encounter (HOSPITAL_COMMUNITY): Payer: BLUE CROSS/BLUE SHIELD

## 2016-04-16 ENCOUNTER — Encounter (HOSPITAL_COMMUNITY): Admission: RE | Admit: 2016-04-16 | Payer: BLUE CROSS/BLUE SHIELD | Source: Ambulatory Visit

## 2016-04-18 ENCOUNTER — Encounter (HOSPITAL_COMMUNITY): Payer: BLUE CROSS/BLUE SHIELD

## 2016-04-21 ENCOUNTER — Encounter (HOSPITAL_COMMUNITY): Payer: BLUE CROSS/BLUE SHIELD

## 2016-04-23 ENCOUNTER — Encounter (HOSPITAL_COMMUNITY): Payer: BLUE CROSS/BLUE SHIELD

## 2016-04-25 ENCOUNTER — Encounter (HOSPITAL_COMMUNITY): Payer: BLUE CROSS/BLUE SHIELD

## 2016-04-28 ENCOUNTER — Encounter (HOSPITAL_COMMUNITY): Payer: BLUE CROSS/BLUE SHIELD

## 2016-04-28 NOTE — Addendum Note (Signed)
Encounter addended by: Jewel Baize, RD on: 04/28/2016  3:21 PM<BR>    Actions taken: Flowsheet data copied forward, Visit Navigator Flowsheet section accepted

## 2016-04-30 ENCOUNTER — Encounter (HOSPITAL_COMMUNITY): Payer: BLUE CROSS/BLUE SHIELD

## 2016-05-02 ENCOUNTER — Encounter (HOSPITAL_COMMUNITY): Payer: BLUE CROSS/BLUE SHIELD

## 2016-05-02 LAB — COMPREHENSIVE METABOLIC PANEL
ALT: 28 U/L (ref 9–46)
AST: 21 U/L (ref 10–35)
Albumin: 4.5 g/dL (ref 3.6–5.1)
Alkaline Phosphatase: 66 U/L (ref 40–115)
BUN: 18 mg/dL (ref 7–25)
CO2: 30 mmol/L (ref 20–31)
Calcium: 10 mg/dL (ref 8.6–10.3)
Chloride: 104 mmol/L (ref 98–110)
Creat: 1.06 mg/dL (ref 0.70–1.33)
Glucose, Bld: 116 mg/dL — ABNORMAL HIGH (ref 65–99)
Potassium: 4.7 mmol/L (ref 3.5–5.3)
Sodium: 142 mmol/L (ref 135–146)
Total Bilirubin: 0.6 mg/dL (ref 0.2–1.2)
Total Protein: 7.3 g/dL (ref 6.1–8.1)

## 2016-05-02 LAB — LIPID PANEL
Cholesterol: 240 mg/dL — ABNORMAL HIGH (ref <200)
HDL: 49 mg/dL (ref >40)
LDL Cholesterol: 155 mg/dL — ABNORMAL HIGH (ref <100)
Total CHOL/HDL Ratio: 4.9 Ratio (ref <5.0)
Triglycerides: 178 mg/dL — ABNORMAL HIGH (ref <150)
VLDL: 36 mg/dL — ABNORMAL HIGH (ref <30)

## 2016-05-05 ENCOUNTER — Encounter (HOSPITAL_COMMUNITY): Payer: BLUE CROSS/BLUE SHIELD

## 2016-05-07 ENCOUNTER — Ambulatory Visit (INDEPENDENT_AMBULATORY_CARE_PROVIDER_SITE_OTHER): Payer: BLUE CROSS/BLUE SHIELD | Admitting: Cardiovascular Disease

## 2016-05-07 ENCOUNTER — Encounter: Payer: Self-pay | Admitting: Cardiovascular Disease

## 2016-05-07 ENCOUNTER — Encounter (HOSPITAL_COMMUNITY): Payer: BLUE CROSS/BLUE SHIELD

## 2016-05-07 VITALS — BP 142/79 | HR 69 | Ht 69.0 in | Wt 191.6 lb

## 2016-05-07 DIAGNOSIS — E785 Hyperlipidemia, unspecified: Secondary | ICD-10-CM

## 2016-05-07 DIAGNOSIS — Z951 Presence of aortocoronary bypass graft: Secondary | ICD-10-CM | POA: Diagnosis not present

## 2016-05-07 MED ORDER — ATORVASTATIN CALCIUM 80 MG PO TABS: 80.0000 mg | ORAL_TABLET | Freq: Every day | ORAL | 3 refills | Status: DC

## 2016-05-07 NOTE — Assessment & Plan Note (Signed)
History of hyperlipidemia currently not on statin therapy with recent lipid profile performed 05/02/16 revealed total cholesterol 240, LDL 155 and HDL of 49. We'll restart him on Lipitor 80 mg a day and obtain a lipid and liver profile in 2 months.

## 2016-05-07 NOTE — Patient Instructions (Signed)
Medication Instructions: START Lipitor 80 mg daily.   Labwork: Your physician recommends that you return for a FASTING lipid profile and hepatic function panel in 2 months   Follow-Up: We request that you follow-up in: 6 months with an extender and in 12 months with Dr Andria Rhein will receive a reminder letter in the mail two months in advance. If you don't receive a letter, please call our office to schedule the follow-up appointment.  If you need a refill on your cardiac medications before your next appointment, please call your pharmacy.

## 2016-05-07 NOTE — Assessment & Plan Note (Signed)
History of coronary artery disease status post cardiac catheterization performed by Dr. Tamala Julian 01/16/16 revealing three-vessel disease with low normal LV function. Did have inferobasal akinesia with mild inferior wall hypokinesia. The following day he underwent coronary artery bypass grafting X 4 by Dr. Caffie Pinto with a LIMA to the LAD, vein to diagonal branch, obtuse marginal branch and a RIMA to the RCA. He has done well postoperatively and did participate in cardiac rehabilitation.

## 2016-05-07 NOTE — Progress Notes (Signed)
05/07/2016 Luke Smith   07-13-1960  [id number]  Primary Physician Horatio Pel, MD Primary Cardiologist: Lorretta Harp MD Renae Gloss  HPI:  Mr. Luke Smith is a fit-appearing 56 year old married Caucasian male father of 2 who works as a Administrator, sports at [employer]. I last saw him in the office 01/15/16. He was referred by Dr. Deland Pretty for cardiovascular evaluation because of an abnormal MET test. His cardiovascular risk profile is  positive for remote tobacco abuse having quit 14 years ago and smoked 10 pack years prior to that. There is no family history of heart disease. Never had a heart attack or stroke. He does have obstructive sleep apnea intolerant to CPAP. I have not seen him back for several years. Over the last month he developed new onset substernal chest burning associated with diaphoresis, shortness of breath and upper extremity radiation. He did undergo colonoscopy and endoscopy recently. This chest pain has been nitrate responsive. A Myoview stress test performed 12/07/15 was low risk evidence of ischemia. There was inferior basal thinning probably related to valve plane artifact. I had originally scheduled to perform cardiac catheterization on him several days later however he was admitted urgently with unstable angina and underwent cardiac catheterization on 01/16/16 by Dr. Tamala Julian revealing three-vessel disease with low normal LV function and inferior wall motion abnormality. The following day he underwent coronary artery bypass grafting X 4 by Dr. Cyndia Bent with a LIMA to his LAD, RIMA to the RCA and vein graft to diagonal branch and obtuse marginal branch. His postoperative course was uncomplicated. He did participate in cardiac rehabilitation and is back to work now.   Current Outpatient Prescriptions  Medication Sig Dispense Refill  . aspirin 81 MG EC tablet Take 81 mg by mouth daily.     . cholecalciferol (VITAMIN D) 1000 units  tablet Take 1,000 Units by mouth daily.    . cyclobenzaprine (FLEXERIL) 10 MG tablet Take 1 tablet (10 mg total) by mouth 2 (two) times daily as needed for muscle spasms. 20 tablet 0  . HYDROcodone-acetaminophen (NORCO/VICODIN) 5-325 MG tablet Take 1 tablet by mouth every 6 (six) hours as needed for moderate pain.    . metoprolol (LOPRESSOR) 50 MG tablet TAKE 1 TABLET BY MOUTH TWICE A DAY 60 tablet 4  . omega-3 acid ethyl esters (LOVAZA) 1 g capsule Take 500 mg by mouth daily.     No current facility-administered medications for this visit.     Allergies  Allergen Reactions  . Aspirin Other (See Comments)    REACTION: acid reflux    Social History   Social History  . Marital status: Married    Spouse name: N/A  . Number of children: 2  . Years of education: N/A   Occupational History  . engineer    Social History Main Topics  . Smoking status: Former Smoker    Packs/day: 0.50    Years: 20.00    Types: Cigarettes    Quit date: 04/14/2001  . Smokeless tobacco: Never Used  . Alcohol use 3.6 oz/week    6 Cans of beer per week     Comment: weekends  . Drug use: No  . Sexual activity: Not on file   Other Topics Concern  . Not on file   Social History Narrative   Married for 37 years.  Works as a Chief Financial Officer for [employer]: General: negative for chills, fever, night sweats or  weight changes.  Cardiovascular: negative for chest pain, dyspnea on exertion, edema, orthopnea, palpitations, paroxysmal nocturnal dyspnea or shortness of breath Dermatological: negative for rash Respiratory: negative for cough or wheezing Urologic: negative for hematuria Abdominal: negative for nausea, vomiting, diarrhea, bright red blood per rectum, melena, or hematemesis Neurologic: negative for visual changes, syncope, or dizziness All other systems reviewed and are otherwise negative except as noted above.    Blood pressure (!) 142/79, pulse 69, height '5\' 9"'  (1.753 m),  weight 191 lb 9.6 oz (86.9 kg), SpO2 99 %.  General appearance: alert and no distress Neck: no adenopathy, no carotid bruit, no JVD, supple, symmetrical, trachea midline and thyroid not enlarged, symmetric, no tenderness/mass/nodules Lungs: clear to auscultation bilaterally Heart: regular rate and rhythm, S1, S2 normal, no murmur, click, rub or gallop Extremities: extremities normal, atraumatic, no cyanosis or edema  EKG not performed today  ASSESSMENT AND PLAN:   Hx of CABG 01/16/16 History of coronary artery disease status post cardiac catheterization performed by Dr. Tamala Julian 01/16/16 revealing three-vessel disease with low normal LV function. Did have inferobasal akinesia with mild inferior wall hypokinesia. The following day he underwent coronary artery bypass grafting X 4 by Dr. Caffie Pinto with a LIMA to the LAD, vein to diagonal branch, obtuse marginal branch and a RIMA to the RCA. He has done well postoperatively and did participate in cardiac rehabilitation.  Dyslipidemia History of hyperlipidemia currently not on statin therapy with recent lipid profile performed 05/02/16 revealed total cholesterol 240, LDL 155 and HDL of 49. We'll restart him on Lipitor 80 mg a day and obtain a lipid and liver profile in 2 months.      Lorretta Harp MD FACP,FACC,FAHA, [facility] 05/07/2016 9:38 AM

## 2016-05-09 ENCOUNTER — Encounter (HOSPITAL_COMMUNITY): Payer: BLUE CROSS/BLUE SHIELD

## 2016-05-12 ENCOUNTER — Encounter (HOSPITAL_COMMUNITY): Payer: BLUE CROSS/BLUE SHIELD

## 2016-05-14 ENCOUNTER — Encounter (HOSPITAL_COMMUNITY): Payer: BLUE CROSS/BLUE SHIELD

## 2016-07-12 ENCOUNTER — Encounter (HOSPITAL_BASED_OUTPATIENT_CLINIC_OR_DEPARTMENT_OTHER): Payer: Self-pay | Admitting: Emergency Medicine

## 2016-07-12 ENCOUNTER — Inpatient Hospital Stay (HOSPITAL_BASED_OUTPATIENT_CLINIC_OR_DEPARTMENT_OTHER)
Admission: EM | Admit: 2016-07-12 | Discharge: 2016-07-15 | DRG: 247 | Disposition: A | Discharge status: Home / Self Care | Payer: BLUE CROSS/BLUE SHIELD | Attending: Cardiology | Admitting: Cardiology

## 2016-07-12 ENCOUNTER — Emergency Department (HOSPITAL_BASED_OUTPATIENT_CLINIC_OR_DEPARTMENT_OTHER): Payer: BLUE CROSS/BLUE SHIELD

## 2016-07-12 DIAGNOSIS — I10 Essential (primary) hypertension: Secondary | ICD-10-CM | POA: Diagnosis present

## 2016-07-12 DIAGNOSIS — I214 Non-ST elevation (NSTEMI) myocardial infarction: Principal | ICD-10-CM | POA: Diagnosis present

## 2016-07-12 DIAGNOSIS — R079 Chest pain, unspecified: Secondary | ICD-10-CM | POA: Diagnosis not present

## 2016-07-12 DIAGNOSIS — Z87891 Personal history of nicotine dependence: Secondary | ICD-10-CM | POA: Diagnosis not present

## 2016-07-12 DIAGNOSIS — I25119 Atherosclerotic heart disease of native coronary artery with unspecified angina pectoris: Secondary | ICD-10-CM | POA: Diagnosis present

## 2016-07-12 DIAGNOSIS — K219 Gastro-esophageal reflux disease without esophagitis: Secondary | ICD-10-CM | POA: Diagnosis present

## 2016-07-12 DIAGNOSIS — I252 Old myocardial infarction: Secondary | ICD-10-CM | POA: Diagnosis not present

## 2016-07-12 DIAGNOSIS — Z886 Allergy status to analgesic agent status: Secondary | ICD-10-CM

## 2016-07-12 DIAGNOSIS — G4730 Sleep apnea, unspecified: Secondary | ICD-10-CM | POA: Diagnosis present

## 2016-07-12 DIAGNOSIS — G4733 Obstructive sleep apnea (adult) (pediatric): Secondary | ICD-10-CM | POA: Diagnosis present

## 2016-07-12 DIAGNOSIS — I2510 Atherosclerotic heart disease of native coronary artery without angina pectoris: Secondary | ICD-10-CM | POA: Diagnosis not present

## 2016-07-12 DIAGNOSIS — Z87442 Personal history of urinary calculi: Secondary | ICD-10-CM

## 2016-07-12 DIAGNOSIS — Z955 Presence of coronary angioplasty implant and graft: Secondary | ICD-10-CM

## 2016-07-12 DIAGNOSIS — Z7982 Long term (current) use of aspirin: Secondary | ICD-10-CM | POA: Diagnosis not present

## 2016-07-12 DIAGNOSIS — E785 Hyperlipidemia, unspecified: Secondary | ICD-10-CM | POA: Diagnosis present

## 2016-07-12 DIAGNOSIS — I25110 Atherosclerotic heart disease of native coronary artery with unstable angina pectoris: Secondary | ICD-10-CM | POA: Diagnosis not present

## 2016-07-12 DIAGNOSIS — Z79899 Other long term (current) drug therapy: Secondary | ICD-10-CM

## 2016-07-12 DIAGNOSIS — Z951 Presence of aortocoronary bypass graft: Secondary | ICD-10-CM | POA: Diagnosis present

## 2016-07-12 HISTORY — DX: Calculus of kidney: N20.0

## 2016-07-12 LAB — CBC
HEMATOCRIT: 42 % (ref 39.0–52.0)
HEMOGLOBIN: 14.5 g/dL (ref 13.0–17.0)
MCH: 30.3 pg (ref 26.0–34.0)
MCHC: 34.5 g/dL (ref 30.0–36.0)
MCV: 87.9 fL (ref 78.0–100.0)
Platelets: 226 10*3/uL (ref 150–400)
RBC: 4.78 MIL/uL (ref 4.22–5.81)
RDW: 13.3 % (ref 11.5–15.5)
WBC: 7.6 10*3/uL (ref 4.0–10.5)

## 2016-07-12 LAB — BASIC METABOLIC PANEL
Anion gap: 7 (ref 5–15)
BUN: 20 mg/dL (ref 6–20)
CALCIUM: 9.3 mg/dL (ref 8.9–10.3)
CO2: 27 mmol/L (ref 22–32)
Chloride: 103 mmol/L (ref 101–111)
Creatinine, Ser: 0.95 mg/dL (ref 0.61–1.24)
GFR calc Af Amer: >60 mL/min (ref >60)
GLUCOSE: 111 mg/dL — AB (ref 65–99)
POTASSIUM: 4.1 mmol/L (ref 3.5–5.1)
Sodium: 137 mmol/L (ref 135–145)

## 2016-07-12 LAB — TROPONIN I
Troponin I: 0.07 ng/mL (ref <0.03)
Troponin I: 0.07 ng/mL (ref <0.03)

## 2016-07-12 LAB — BRAIN NATRIURETIC PEPTIDE: B NATRIURETIC PEPTIDE 5: 59.1 pg/mL (ref 0.0–100.0)

## 2016-07-12 LAB — MRSA PCR SCREENING: MRSA BY PCR: NEGATIVE

## 2016-07-12 MED ORDER — NITROGLYCERIN IN D5W 200-5 MCG/ML-% IV SOLN
0.0000 ug/min | INTRAVENOUS | Status: DC
  Administered 2016-07-12: 30 ug via INTRAVENOUS
  Administered 2016-07-13: 75 ug/min via INTRAVENOUS
  Administered 2016-07-13 – 2016-07-14 (×3): 85 ug/min via INTRAVENOUS
  Filled 2016-07-12 (×4): qty 250

## 2016-07-12 MED ORDER — METOPROLOL TARTRATE 5 MG/5ML IV SOLN
INTRAVENOUS | Status: AC
  Administered 2016-07-12: 5 mg
  Filled 2016-07-12: qty 5

## 2016-07-12 MED ORDER — METOPROLOL TARTRATE 25 MG PO TABS
50.0000 mg | ORAL_TABLET | Freq: Two times a day (BID) | ORAL | Status: DC
Start: 2016-07-12 — End: 2016-07-15
  Administered 2016-07-13 – 2016-07-15 (×6): 50 mg via ORAL
  Filled 2016-07-12: qty 1
  Filled 2016-07-12: qty 2
  Filled 2016-07-12: qty 1
  Filled 2016-07-12: qty 2
  Filled 2016-07-12 (×2): qty 1

## 2016-07-12 MED ORDER — HEPARIN SODIUM (PORCINE) 5000 UNIT/ML IJ SOLN
4000.0000 [IU] | Freq: Once | INTRAMUSCULAR | Status: AC
  Administered 2016-07-12: 4000 [IU] via INTRAVENOUS

## 2016-07-12 MED ORDER — NITROGLYCERIN 0.4 MG SL SUBL: 0.4000 mg | SUBLINGUAL_TABLET | SUBLINGUAL | Status: DC | PRN

## 2016-07-12 MED ORDER — CYCLOBENZAPRINE HCL 10 MG PO TABS
10.0000 mg | ORAL_TABLET | Freq: Two times a day (BID) | ORAL | Status: DC | PRN
  Administered 2016-07-13 – 2016-07-14 (×2): 10 mg via ORAL
  Filled 2016-07-12 (×2): qty 1

## 2016-07-12 MED ORDER — ONDANSETRON HCL 4 MG/2ML IJ SOLN: 4.0000 mg | Freq: Four times a day (QID) | INTRAMUSCULAR | Status: DC | PRN

## 2016-07-12 MED ORDER — METOPROLOL TARTRATE 5 MG/5ML IV SOLN
5.0000 mg | Freq: Once | INTRAVENOUS | Status: AC
  Administered 2016-07-12: 5 mg via INTRAVENOUS

## 2016-07-12 MED ORDER — NITROGLYCERIN IN D5W 200-5 MCG/ML-% IV SOLN
INTRAVENOUS | Status: AC
  Administered 2016-07-12: 30 ug via INTRAVENOUS
  Filled 2016-07-12: qty 250

## 2016-07-12 MED ORDER — METOPROLOL TARTRATE 5 MG/5ML IV SOLN
INTRAVENOUS | Status: AC
  Administered 2016-07-12: 5 mg via INTRAVENOUS
  Filled 2016-07-12: qty 5

## 2016-07-12 MED ORDER — VITAMIN D 1000 UNITS PO TABS
1000.0000 [IU] | ORAL_TABLET | Freq: Every day | ORAL | Status: DC
  Administered 2016-07-13 – 2016-07-15 (×3): 1000 [IU] via ORAL
  Filled 2016-07-12 (×3): qty 1

## 2016-07-12 MED ORDER — OMEGA-3-ACID ETHYL ESTERS 1 G PO CAPS
1000.0000 mg | ORAL_CAPSULE | Freq: Every day | ORAL | Status: DC
  Administered 2016-07-13 – 2016-07-15 (×3): 1000 mg via ORAL
  Filled 2016-07-12 (×3): qty 1

## 2016-07-12 MED ORDER — FENTANYL CITRATE (PF) 100 MCG/2ML IJ SOLN
INTRAMUSCULAR | Status: AC
  Administered 2016-07-12: 25 ug
  Filled 2016-07-12: qty 2

## 2016-07-12 MED ORDER — FENTANYL CITRATE (PF) 100 MCG/2ML IJ SOLN
INTRAMUSCULAR | Status: AC
  Filled 2016-07-12: qty 2

## 2016-07-12 MED ORDER — FENTANYL CITRATE (PF) 100 MCG/2ML IJ SOLN
25.0000 ug | Freq: Once | INTRAMUSCULAR | Status: AC
  Administered 2016-07-12: 25 ug via INTRAVENOUS

## 2016-07-12 MED ORDER — ATORVASTATIN CALCIUM 80 MG PO TABS
80.0000 mg | ORAL_TABLET | Freq: Every day | ORAL | Status: DC
  Administered 2016-07-13 – 2016-07-15 (×3): 80 mg via ORAL
  Filled 2016-07-12 (×3): qty 1

## 2016-07-12 MED ORDER — ASPIRIN EC 81 MG PO TBEC: 81.0000 mg | DELAYED_RELEASE_TABLET | Freq: Every day | ORAL | Status: DC

## 2016-07-12 MED ORDER — HEPARIN (PORCINE) IN NACL 100-0.45 UNIT/ML-% IJ SOLN
1000.0000 [IU]/h | INTRAMUSCULAR | Status: DC
  Administered 2016-07-12 – 2016-07-13 (×2): 1000 [IU]/h via INTRAVENOUS
  Filled 2016-07-12 (×2): qty 250

## 2016-07-12 MED ORDER — ASPIRIN 81 MG PO CHEW
324.0000 mg | CHEWABLE_TABLET | Freq: Once | ORAL | Status: AC
  Administered 2016-07-12: 324 mg via ORAL
  Filled 2016-07-12: qty 4

## 2016-07-12 MED ORDER — ACETAMINOPHEN 325 MG PO TABS
650.0000 mg | ORAL_TABLET | ORAL | Status: DC | PRN
  Administered 2016-07-13 (×3): 650 mg via ORAL
  Filled 2016-07-12 (×4): qty 2

## 2016-07-12 MED ORDER — NITROGLYCERIN 2 % TD OINT
1.0000 [in_us] | TOPICAL_OINTMENT | Freq: Once | TRANSDERMAL | Status: AC
  Administered 2016-07-12: 1 [in_us] via TOPICAL
  Filled 2016-07-12: qty 1

## 2016-07-12 MED ORDER — ASPIRIN EC 81 MG PO TBEC
81.0000 mg | DELAYED_RELEASE_TABLET | Freq: Every day | ORAL | Status: DC
  Administered 2016-07-13 – 2016-07-14 (×2): 81 mg via ORAL
  Filled 2016-07-12 (×2): qty 1

## 2016-07-12 NOTE — ED Provider Notes (Signed)
[facility] Provider Note   CSN: [id number] Arrival date & time: 07/12/16  1643  By signing my name below, I, Dolores Hoose, attest that this documentation has been prepared under the direction and in the presence of non-physician practitioner, Alecia Lemming PA-C. Electronically Signed: Dolores Hoose, Scribe. 07/12/2016. 5:27 PM.  History   Chief Complaint Chief Complaint  Patient presents with  . Chest Pain   The history is provided by the patient. No language interpreter was used.    HPI Comments:  Luke Smith is a 56 y.o. male with pmhx of CAD, GERD, HTN, and MI who presents to the Emergency Department complaining of intermittent, worsening chest pain onset 3 days but worsening about 8 hours ago. Pt describes his chest pain as a 4/10 "pressure-like" pain that radiates into his left arm exacerbated with minimal exertion. Pt reports associated diaphoresis and nausea. He states his symptoms are similar to his previous heart attack. Pt has tried 81mg  ASA with his BP medication at home with minimal relief of symptoms. He was given nitroglycerine here in the ED which gave him some moderate relief. Denies any fevers.Pshx of quadruple bypass in Oct 2017.   Patients reports having exertional chest pain symptoms relieved with rest over the past 3 days while he was in the [place] for work. He returned to the [place] yesterday. 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 Symptoms resolved with nitroglycerin.  He also notes that he recently returned from a trip to the [place] where he contracted some food-borne illness resulting in diarrhea and vomiting. Pt took Cipro for this with relief of symptoms.   Past Medical History:  Diagnosis Date  . Coronary artery disease   . GERD (gastroesophageal reflux disease)   . Hypertension   . Kidney stone   . MI (myocardial infarction)   . Perforated ear drum    left ear  . Sleep apnea      Patient Active Problem List   Diagnosis Date Noted  . Dyslipidemia 02/08/2016  . Hx of CABG 01/16/16 01/17/2016  . NSTEMI (non-ST elevated myocardial infarction) ([place]) 01/15/2016  . Chest pain with moderate risk of acute coronary syndrome 11/29/2015  . History of smoking 11/29/2015  . Sleep apnea   . Abnormal stress test 09/02/2012    Past Surgical History:  Procedure Laterality Date  . CARDIAC CATHETERIZATION N/A 01/16/2016   Procedure: Left Heart Cath and Coronary Angiography;  Surgeon: Belva Crome, MD;  Location: [facility];  Service: Cardiovascular;  Laterality: N/A;  . CORONARY ARTERY BYPASS GRAFT N/A 01/17/2016   Procedure: CORONARY ARTERY BYPASS GRAFTING (CABG) x 4 (LIMA to LAD, SVG to DIAGONAL, SVG to OM, and RIMA to RCA) with EVH from [place];  Surgeon: Gaye Pollack, MD;  Location: [facility];  Service: Open Heart Surgery;  Laterality: N/A;  . DENTAL SURGERY  childhood  . NASAL SEPTUM SURGERY    . TEE WITHOUT CARDIOVERSION N/A 01/17/2016   Procedure: TRANSESOPHAGEAL ECHOCARDIOGRAM (TEE);  Surgeon: Gaye Pollack, MD;  Location: [facility];  Service: Open Heart Surgery;  Laterality: N/A;    OB History    No data available       Home Medications    Prior to Admission medications   Medication Sig Start Date End Date Taking? Authorizing Provider  aspirin 81 MG EC tablet Take 81 mg by mouth daily.  11/26/15  Yes Historical  Provider, MD  atorvastatin (LIPITOR) 80 MG tablet Take 1 tablet (80 mg total) by mouth daily. 05/07/16 08/05/16 Yes Lorretta Harp, MD  cholecalciferol (VITAMIN D) 1000 units tablet Take 1,000 Units by mouth daily.   Yes Historical Provider, MD  HYDROcodone-acetaminophen (NORCO/VICODIN) 5-325 MG tablet Take 1 tablet by mouth every 6 (six) hours as needed for moderate pain.   Yes Historical Provider, MD  metoprolol (LOPRESSOR) 50 MG tablet TAKE 1 TABLET BY MOUTH TWICE A DAY 04/09/16  Yes Lorretta Harp, MD  nitroGLYCERIN (NITROSTAT) 0.4 MG SL tablet Place 0.4 mg under the tongue every 5 (five) minutes as needed for chest pain.   Yes Historical Provider, MD  omega-3 acid ethyl esters (LOVAZA) 1 g capsule Take 500 mg by mouth daily.   Yes Historical Provider, MD  cyclobenzaprine (FLEXERIL) 10 MG tablet Take 1 tablet (10 mg total) by mouth 2 (two) times daily as needed for muscle spasms. 02/05/16   Fredia Sorrow, MD    Family History Family History  Problem Relation Age of Onset  . Bone cancer Father   . Colon cancer Neg Hx     Social History Social History  Substance Use Topics  . Smoking status: Former Smoker    Packs/day: 0.50    Years: 20.00    Types: Cigarettes    Quit date: 04/14/2001  . Smokeless tobacco: Never Used  . Alcohol use 3.6 oz/week    6 Cans of beer per week     Comment: social     Allergies   Aspirin   Review of Systems Review of Systems  Constitutional: Positive for diaphoresis. Negative for fever.  Eyes: Negative for redness.  Respiratory: Positive for chest tightness. Negative for cough and shortness of breath.   Cardiovascular: Positive for chest pain. Negative for palpitations and leg swelling.  Gastrointestinal: Positive for nausea. Negative for abdominal pain and vomiting.  Genitourinary: Negative for dysuria.  Musculoskeletal: Negative for back pain and neck pain.  Skin: Negative for rash.  Neurological: Negative for syncope and light-headedness.  Psychiatric/Behavioral: The patient is not nervous/anxious.      Physical Exam Updated Vital Signs BP 122/86   Pulse 76   Temp 97.5 F (36.4 C) (Oral)   Resp 12   Ht 5\' 9"  (1.753 m)   Wt 185 lb (83.9 kg)   SpO2 100%   BMI 27.32 kg/m   Physical Exam  Constitutional: He is oriented to person, place, and time. He appears well-developed and well-nourished. No distress.  HENT:  Head: Normocephalic and atraumatic.  Mouth/Throat: Mucous membranes are normal. Mucous membranes are not  dry.  Eyes: Conjunctivae are normal.  Neck: Trachea normal and normal range of motion. Neck supple. Normal carotid pulses and no JVD present. No muscular tenderness present. Carotid bruit is not present. No tracheal deviation present.  Cardiovascular: Normal rate, regular rhythm, S1 normal, S2 normal, normal heart sounds and intact distal pulses.  Exam reveals no distant heart sounds and no decreased pulses.   No murmur heard. Pulmonary/Chest: Effort normal and breath sounds normal. No respiratory distress. He has no wheezes. He exhibits no tenderness.  Abdominal: Soft. Normal aorta and bowel sounds are normal. He exhibits no distension. There is no tenderness. There is no rebound and no guarding.  Musculoskeletal: He exhibits no edema.  Neurological: He is alert and oriented to person, place, and time.  Skin: Skin is warm and dry. He is not diaphoretic. No cyanosis. No pallor.  Psychiatric: He has a  normal mood and affect.  Nursing note and vitals reviewed.    ED Treatments / Results  DIAGNOSTIC STUDIES:  Oxygen Saturation is 100% on RA, normal by my interpretation.    COORDINATION OF CARE:  5:25 PM Reviewed EKG  5:36 PM Discussed treatment plan with pt at bedside which includes admission to hospital for chest pain rule-out and pt agreed to plan.  High suspicion for unstable angina or non-ST segment elevation MI. Discussed with Dr. Stark Jock. Nitroglycerin ordered.  6:11 PM Consult with Dr. Tommi Rumps in Cardiology, who recommended heparinizing the patient and admitting to step-down unit.   7:20 PM Pt stable. Discomfort remains 4/10. Awaiting transport.   Labs (all labs ordered are listed, but only abnormal results are displayed) Labs Reviewed  BASIC METABOLIC PANEL - Abnormal; Notable for the following:       Result Value   Glucose, Bld 111 (*)    All other components within normal limits  TROPONIN I - Abnormal; Notable for the following:    Troponin I 0.07 (*)    All other  components within normal limits  CBC  HEPARIN LEVEL (UNFRACTIONATED)  HEPARIN LEVEL (UNFRACTIONATED)  CBC    EKG  EKG Interpretation  Date/Time:  Saturday July 12 2016 16:49:58 EDT Ventricular Rate:  80 PR Interval:  146 QRS Duration: 86 QT Interval:  396 QTC Calculation: 456 R Axis:   89 Text Interpretation:  Normal sinus rhythm with sinus arrhythmia Possible Inferior infarct , age undetermined Abnormal ECG Confirmed by DELO  MD, DOUGLAS ([id number]) on 07/12/2016 5:41:23 PM       Radiology Dg Chest Port 1 View  Result Date: 07/12/2016 CLINICAL DATA:  Patient c/o left chest pain that started yesterday while traveling. Patient reports quadruple bypass 6 months ago. Patient states pain feels like acid reflux, and is radiating to his shoulder. Patient took NTG at [facility] in October 2017, just returned from working in [place] EXAM: PORTABLE CHEST 1 VIEW COMPARISON:  02/20/2016 FINDINGS: Stable changes from previous cardiac surgery. The cardiac silhouette is normal in size and configuration. No mediastinal or hilar masses. No convincing adenopathy. Clear lungs.  No pleural effusion or pneumothorax. Skeletal structures are grossly intact. IMPRESSION: No acute cardiopulmonary disease. Electronically Signed   By: Lajean Manes M.D.   On: 07/12/2016 18:01    Procedures Procedures (including critical care time)  Medications Ordered in ED Medications  heparin ADULT infusion 100 units/mL (25000 units/246mL sodium chloride 0.45%) (1,000 Units/hr Intravenous New Bag/Given 07/12/16 1832)  aspirin chewable tablet 324 mg (324 mg Oral Given 07/12/16 1746)  nitroGLYCERIN (NITROGLYN) 2 % ointment 1 inch (1 inch Topical Given 07/12/16 1747)  heparin injection 4,000 Units (4,000 Units Intravenous Bolus 07/12/16 1833)     Initial Impression / Assessment and Plan / ED Course  I have reviewed the triage vital signs and the nursing notes.  Pertinent labs & imaging results that were available  during my care of the patient were reviewed by me and considered in my medical decision making (see chart for details).     CRITICAL CARE Performed by: Faustino Congress Total critical care time: 45 minutes Critical care time was exclusive of separately billable procedures and treating other patients. Critical care was necessary to treat or prevent imminent or life-threatening deterioration. Critical care was time spent personally by me on the following activities: development of treatment plan with patient and/or surrogate as well as nursing, discussions with consultants, evaluation of patient's response to treatment, examination of patient, obtaining history  from patient or surrogate, ordering and performing treatments and interventions, ordering and review of laboratory studies, ordering and review of radiographic studies, pulse oximetry and re-evaluation of patient's condition. Final Clinical Impressions(s) / ED Diagnoses   Final diagnoses:  NSTEMI (non-ST elevated myocardial infarction) ([place])   NSTEMI, admit.   New Prescriptions New Prescriptions   No medications on file  I personally performed the services described in this documentation, which was scribed in my presence. The recorded information has been reviewed and is accurate.     Carlisle Cater, PA-C 07/12/16 [place], MD 07/12/16 [phone]

## 2016-07-12 NOTE — Progress Notes (Signed)
Patient transferred from [facility] via carelink. Admitted to 4N03, pt awake alert and oriented, complains of aching chest pain 7/10 aat this time. Cardiology MD at bedside, RN receiving multiple STAT orders. Wife called and updated.

## 2016-07-12 NOTE — H&P (Signed)
History & Physical    Patient ID: Luke Smith MRN: [id number], DOB/AGE: [id number]   Admit date: 07/12/2016   Primary Physician: Horatio Pel, MD Primary Cardiologist: Quay Burow, MD  Patient Profile    56M former smoker with, HTN, OSA, CAD s/p MI and 4V CABG (LIMA to LAD, RIMA to RCA, SVG to D1, SVG to OM on 01/17/16) who presents with NSTEMI.   Past Medical History    Past Medical History:  Diagnosis Date  . Coronary artery disease   . GERD (gastroesophageal reflux disease)   . Hypertension   . Kidney stone   . MI (myocardial infarction)   . Perforated ear drum    left ear  . Sleep apnea     Past Surgical History:  Procedure Laterality Date  . CARDIAC CATHETERIZATION N/A 01/16/2016   Procedure: Left Heart Cath and Coronary Angiography;  Surgeon: Belva Crome, MD;  Location: [facility];  Service: Cardiovascular;  Laterality: N/A;  . CORONARY ARTERY BYPASS GRAFT N/A 01/17/2016   Procedure: CORONARY ARTERY BYPASS GRAFTING (CABG) x 4 (LIMA to LAD, SVG to DIAGONAL, SVG to OM, and RIMA to RCA) with EVH from [place];  Surgeon: Gaye Pollack, MD;  Location: [facility];  Service: Open Heart Surgery;  Laterality: N/A;  . DENTAL SURGERY  childhood  . NASAL SEPTUM SURGERY    . TEE WITHOUT CARDIOVERSION N/A 01/17/2016   Procedure: TRANSESOPHAGEAL ECHOCARDIOGRAM (TEE);  Surgeon: Gaye Pollack, MD;  Location: [facility];  Service: Open Heart Surgery;  Laterality: N/A;     Allergies  Allergies  Allergen Reactions  . Aspirin Other (See Comments)    REACTION: acid reflux    History of Present Illness    56M former smoker with, HTN, OSA, CAD s/p MI and 4V CABG (LIMA to LAD, RIMA to RCA, SVG to D1, SVG to OM on 01/17/16) who presents with NSTEMI.   [name] was in the [place] this past week for work when he noticed intermittent chest and left arm discomfort. He had not felt these symptoms since his  bypass operation.  He flew home on Friday and while rushing between flights developed such severe symptoms he took a NTG for the first time and it relieved the pain.  Since then, he had the pain off and on and the developed more severe symptoms today and he went to [facility] for further care.   On arrival to [facility], VS were HR 80, RR 18, BP 142/90, 100% on RA. CP 3/10 in severity. ECG demonstrated NSR with old inferior infarct, possible with posterior involvement. Compared to 02/08/16, the inferior Q waves now appear somewhat more prominent and lateral TWI have normalized. Labs were notable for K 4.1, Cr 0.95, TnI 0.07.  CXR demonstrated no acute process.  He was given ASA, heparin, and nitropaste and transferred to [facility] for further management.   On arrival to [facility], he was in severe pain 7/10 in severity and hypertensive with systolic BPs in the 419F with HRs of nearly 90bpm. ECG was stable. With IV NTG, IV fentantly, and IV metoprolol, BP and symptoms improved.   Home Medications    Prior to Admission medications   Medication Sig Start Date End Date Taking? Authorizing Provider  aspirin 81 MG EC tablet Take 81 mg by mouth daily.  11/26/15  Yes Historical Provider, MD  atorvastatin (LIPITOR) 80 MG tablet Take 1 tablet (80 mg total)  by mouth daily. 05/07/16 08/05/16 Yes Lorretta Harp, MD  cholecalciferol (VITAMIN D) 1000 units tablet Take 1,000 Units by mouth daily.   Yes Historical Provider, MD  HYDROcodone-acetaminophen (NORCO/VICODIN) 5-325 MG tablet Take 1 tablet by mouth every 6 (six) hours as needed for moderate pain.   Yes Historical Provider, MD  metoprolol (LOPRESSOR) 50 MG tablet TAKE 1 TABLET BY MOUTH TWICE A DAY 04/09/16  Yes Lorretta Harp, MD  nitroGLYCERIN (NITROSTAT) 0.4 MG SL tablet Place 0.4 mg under the tongue every 5 (five) minutes as needed for chest pain.   Yes Historical Provider, MD  omega-3 acid ethyl esters (LOVAZA) 1 g capsule Take 500 mg by mouth daily.   Yes  Historical Provider, MD  cyclobenzaprine (FLEXERIL) 10 MG tablet Take 1 tablet (10 mg total) by mouth 2 (two) times daily as needed for muscle spasms. 02/05/16   Fredia Sorrow, MD    Family History    Family History  Problem Relation Age of Onset  . Bone cancer Father   . Colon cancer Neg Hx     Social History    Social History   Social History  . Marital status: Married    Spouse name: N/A  . Number of children: 2  . Years of education: N/A   Occupational History  . engineer    Social History Main Topics  . Smoking status: Former Smoker    Packs/day: 0.50    Years: 20.00    Types: Cigarettes    Quit date: 04/14/2001  . Smokeless tobacco: Never Used  . Alcohol use 3.6 oz/week    6 Cans of beer per week     Comment: social  . Drug use: No  . Sexual activity: Not on file   Other Topics Concern  . Not on file   Social History Narrative   Married for 37 years.  Works as a Chief Financial Officer for [employer]:  No chills, fever, night sweats or weight changes.  Cardiovascular:  See hpi Dermatological: No rash, lesions/masses Respiratory:   See hpi Urologic: No hematuria, dysuria Abdominal:   No nausea, vomiting, diarrhea, bright red blood per rectum, melena, or hematemesis Neurologic:  No visual changes, wkns, changes in mental status. All other systems reviewed and are otherwise negative except as noted above.  Physical Exam    Blood pressure (!) 155/92, pulse 73, temperature 97.5 F (36.4 C), temperature source Oral, resp. rate 19, height 5\' 9"  (1.753 m), weight 83.9 kg (185 lb), SpO2 100 %.  General: Pleasant, moderate distress on initial exam, relaxed after admin of meds listed in hpi Psych: Normal affect. Neuro: Alert and oriented X 3. Moves all extremities spontaneously. HEENT: Normal  Neck: Supple without bruits or JVD. Lungs:  Resp regular and unlabored, CTA. Heart: RRR no s3, s4, or murmurs. Abdomen: Soft, non-tender,  non-distended, BS + x 4.  Extremities: No clubbing, cyanosis or edema. PT/Radials 2+ and equal bilaterally.  Labs    Troponin (Point of Care Test) No results for input(s): TROPIPOC in the last 72 hours.  Recent Labs  07/12/16 1711  TROPONINI 0.07*   Lab Results  Component Value Date   WBC 7.6 07/12/2016   HGB 14.5 07/12/2016   HCT 42.0 07/12/2016   MCV 87.9 07/12/2016   PLT 226 07/12/2016    Recent Labs Lab 07/12/16 1711  NA 137  K 4.1  CL 103  CO2 27  BUN 20  CREATININE  0.95  CALCIUM 9.3  GLUCOSE 111*   Lab Results  Component Value Date   CHOL 240 (H) 05/02/2016   HDL 49 05/02/2016   LDLCALC 155 (H) 05/02/2016   TRIG 178 (H) 05/02/2016   Lab Results  Component Value Date   DDIMER 0.28 01/15/2016     Radiology Studies    Dg Chest Port 1 View  Result Date: 07/12/2016 CLINICAL DATA:  Patient c/o left chest pain that started yesterday while traveling. Patient reports quadruple bypass 6 months ago. Patient states pain feels like acid reflux, and is radiating to his shoulder. Patient took NTG at [facility] in October 2017, just returned from working in [place] EXAM: PORTABLE CHEST 1 VIEW COMPARISON:  02/20/2016 FINDINGS: Stable changes from previous cardiac surgery. The cardiac silhouette is normal in size and configuration. No mediastinal or hilar masses. No convincing adenopathy. Clear lungs.  No pleural effusion or pneumothorax. Skeletal structures are grossly intact. IMPRESSION: No acute cardiopulmonary disease. Electronically Signed   By: Lajean Manes M.D.   On: 07/12/2016 18:01    ECG & Cardiac Imaging    ECG Compared to 02/08/16, the inferior Q waves now appear somewhat more prominent and lateral TWI have normalized.  01/16/16 cardiac cath  Severe three-vessel coronary disease with 99% mid RCA serving as a culprit for presentation and non-ST elevation MI. RCA has TIMI grade 2 flow and has left-to-right collaterals.  85% segmental mid LAD  stenosis and apical 75% stenosis. The large diagonal contains segmental 60-70% narrowing.  90-95% mid circumflex after the first large obtuse marginal branch origin.  Inferobasal akinesis with mid inferior wall hypokinesis. EF 45-50%. Elevated LVEDP.  Assessment & Plan    89M former smoker with, HTN, OSA, CAD s/p MI and 4V CABG (LIMA to LAD, RIMA to RCA, SVG to D1, SVG to OM on 01/17/16) who presents with NSTEMI.  He has had severe pain that is improved with aggressive medical management. He will require angiography which may need to be performed before Monday if we are unable to control his symptoms adequately.   - UFH - TTE - ASA 81mg  daily - Oral metoprolol - continue nitro gtt - atorvastatin 80mg  - lipids, A1c - cardiac cath  Signed, Lamar Sprinkles, MD 07/12/2016, 6:18 PM

## 2016-07-12 NOTE — Progress Notes (Signed)
ANTICOAGULATION CONSULT NOTE - Initial Consult  Pharmacy Consult for heparin Indication: chest pain/ACS  Allergies  Allergen Reactions  . Aspirin Other (See Comments)    REACTION: acid reflux    Patient Measurements: Height: 5\' 9"  (175.3 cm) Weight: 185 lb (83.9 kg) IBW/kg (Calculated) : 70.7 Heparin Dosing Weight: 83.9 kg  Vital Signs: Temp: 97.5 F (36.4 C) (03/31 1651) Temp Source: Oral (03/31 1651) BP: 144/94 (03/31 1730) Pulse Rate: 83 (03/31 1730)  Labs:  Recent Labs  07/12/16 1711  HGB 14.5  HCT 42.0  PLT 226  CREATININE 0.95  TROPONINI 0.07*    Estimated Creatinine Clearance: 88.9 mL/min (by C-G formula based on SCr of 0.95 mg/dL).   Medical History: Past Medical History:  Diagnosis Date  . Coronary artery disease   . GERD (gastroesophageal reflux disease)   . Hypertension   . Kidney stone   . MI (myocardial infarction)   . Perforated ear drum    left ear  . Sleep apnea     Medications:  Scheduled:  . heparin  4,000 Units Intravenous Once   Assessment: Luke Smith is a 56 y.o. male with pmhx of CAD, HTN, and MI who presents to the ED complaining of chest pain. No anticoagulation prior to admission. CBC stable. Patient has already received heparin 4000 units bolus. NO bleeding reported.   Goal of Therapy:  Heparin level 0.3-0.7 units/ml Monitor platelets by anticoagulation protocol: Yes   Plan:  Start heparin infusion at 1000 units/hr Check anti-Xa level in 6 hours and daily while on heparin Continue to monitor H&H and platelets  Georga Bora, PharmD Clinical Pharmacist Pager: [phone] 07/12/2016 6:13 PM

## 2016-07-12 NOTE — ED Triage Notes (Signed)
Patient c/o left chest pain that started yesterday while traveling. Patient reports quadruple bypass 6 months ago. Patient states pain feels like acid reflux, and is radiating to his shoulder. Patient took NTG at home @ 30 minutes ago.

## 2016-07-12 NOTE — ED Notes (Signed)
Received call from lab, Troponin 0.07.  Results given to C. Mariea Clonts, RN and Dr. Stark Jock.

## 2016-07-12 NOTE — ED Notes (Signed)
Attempted to call report, nurse unavailable.

## 2016-07-13 DIAGNOSIS — I214 Non-ST elevation (NSTEMI) myocardial infarction: Principal | ICD-10-CM

## 2016-07-13 DIAGNOSIS — E785 Hyperlipidemia, unspecified: Secondary | ICD-10-CM

## 2016-07-13 DIAGNOSIS — Z951 Presence of aortocoronary bypass graft: Secondary | ICD-10-CM

## 2016-07-13 DIAGNOSIS — G4730 Sleep apnea, unspecified: Secondary | ICD-10-CM

## 2016-07-13 LAB — BASIC METABOLIC PANEL
ANION GAP: 8 (ref 5–15)
BUN: 17 mg/dL (ref 6–20)
CHLORIDE: 103 mmol/L (ref 101–111)
CO2: 25 mmol/L (ref 22–32)
Calcium: 8.6 mg/dL — ABNORMAL LOW (ref 8.9–10.3)
Creatinine, Ser: 0.88 mg/dL (ref 0.61–1.24)
GFR calc Af Amer: >60 mL/min (ref >60)
GFR calc non Af Amer: >60 mL/min (ref >60)
Glucose, Bld: 102 mg/dL — ABNORMAL HIGH (ref 65–99)
POTASSIUM: 3.8 mmol/L (ref 3.5–5.1)
SODIUM: 136 mmol/L (ref 135–145)

## 2016-07-13 LAB — PROTIME-INR
INR: 1.06
Prothrombin Time: 13.9 seconds (ref 11.4–15.2)

## 2016-07-13 LAB — CBC
HCT: 38.5 % — ABNORMAL LOW (ref 39.0–52.0)
Hemoglobin: 13 g/dL (ref 13.0–17.0)
MCH: 29.7 pg (ref 26.0–34.0)
MCHC: 33.8 g/dL (ref 30.0–36.0)
MCV: 88.1 fL (ref 78.0–100.0)
PLATELETS: 202 10*3/uL (ref 150–400)
RBC: 4.37 MIL/uL (ref 4.22–5.81)
RDW: 13.5 % (ref 11.5–15.5)
WBC: 9.4 10*3/uL (ref 4.0–10.5)

## 2016-07-13 LAB — LIPID PANEL
CHOL/HDL RATIO: 3.4 ratio
CHOLESTEROL: 94 mg/dL (ref 0–200)
HDL: 28 mg/dL — AB (ref >40)
LDL Cholesterol: 22 mg/dL (ref 0–99)
Triglycerides: 218 mg/dL — ABNORMAL HIGH (ref <150)
VLDL: 44 mg/dL — AB (ref 0–40)

## 2016-07-13 LAB — TROPONIN I
Troponin I: 0.07 ng/mL (ref <0.03)
Troponin I: 0.06 ng/mL (ref <0.03)

## 2016-07-13 LAB — HIV ANTIBODY (ROUTINE TESTING W REFLEX): HIV Screen 4th Generation wRfx: NONREACTIVE

## 2016-07-13 LAB — HEPARIN LEVEL (UNFRACTIONATED)
HEPARIN UNFRACTIONATED: 0.38 [IU]/mL (ref 0.30–0.70)
Heparin Unfractionated: 0.35 IU/mL (ref 0.30–0.70)

## 2016-07-13 MED ORDER — TRAMADOL HCL 50 MG PO TABS
50.0000 mg | ORAL_TABLET | Freq: Two times a day (BID) | ORAL | Status: DC | PRN
  Administered 2016-07-13: 50 mg via ORAL
  Filled 2016-07-13: qty 1

## 2016-07-13 NOTE — Progress Notes (Signed)
ANTICOAGULATION CONSULT NOTE - Follow Up Consult  Pharmacy Consult for Heparin  Indication: chest pain/ACS  Allergies  Allergen Reactions  . Aspirin Other (See Comments)    REACTION: acid reflux   Patient Measurements: Height: 5\' 9"  (175.3 cm) Weight: 183 lb 1.6 oz (83.1 kg) IBW/kg (Calculated) : 70.7  Vital Signs: Temp: 97.7 F (36.5 C) (04/01 0300) Temp Source: Oral (04/01 0300) BP: 102/70 (04/01 0600) Pulse Rate: 61 (04/01 0600)  Labs:  Recent Labs  07/12/16 1711 07/12/16 2252 07/13/16 0420 07/13/16 0545  HGB 14.5  --  13.0  --   HCT 42.0  --  38.5*  --   PLT 226  --  202  --   HEPARINUNFRC  --   --   --  0.38  CREATININE 0.95  --  0.88  --   TROPONINI 0.07* 0.07* 0.07*  --     Estimated Creatinine Clearance: 96 mL/min (by C-G formula based on SCr of 0.88 mg/dL).  Assessment: Transfer from [facility] on heparin for CP, initial heparin level is therapeutic, ?cath Monday or sooner if symptoms worsens  Goal of Therapy:  Heparin level 0.3-0.7 units/ml Monitor platelets by anticoagulation protocol: Yes   Plan:  -Cont heparin at 1000 units/hr -1400 HL   [place], [place] 07/13/2016,7:09 AM

## 2016-07-13 NOTE — Progress Notes (Signed)
Pt reporting continued feeling of "stomach moving into my chest" which he reports was an early symptom the previous day before admission. NTG increased to 75.

## 2016-07-13 NOTE — Progress Notes (Signed)
ANTICOAGULATION CONSULT NOTE - Follow Up Consult  Pharmacy Consult for Heparin  Indication: chest pain/ACS  Allergies  Allergen Reactions  . Aspirin Other (See Comments)    Acid reflux   Patient Measurements: Height: 5\' 9"  (175.3 cm) Weight: 183 lb 1.6 oz (83.1 kg) IBW/kg (Calculated) : 70.7  Vital Signs: Temp: 97.7 F (36.5 C) (04/01 1210) Temp Source: Oral (04/01 1210) BP: 108/68 (04/01 1210) Pulse Rate: 59 (04/01 1210)  Labs:  Recent Labs  07/12/16 1711 07/12/16 2252 07/13/16 0420 07/13/16 0545 07/13/16 1005 07/13/16 1440  HGB 14.5  --  13.0  --   --   --   HCT 42.0  --  38.5*  --   --   --   PLT 226  --  202  --   --   --   HEPARINUNFRC  --   --   --  0.38  --  0.35  CREATININE 0.95  --  0.88  --   --   --   TROPONINI 0.07* 0.07* 0.07*  --  0.06*  --    Estimated Creatinine Clearance: 96 mL/min (by C-G formula based on SCr of 0.88 mg/dL).  Assessment: Luke Smith a 56 y.o.malewith pmhx of CAD, HTN, and MI who transferred from [facility] for chest pain. No anticoagulation prior to admission. Tentative plan for cath Monday.  Hgb down slightly, PLT stable, no bleed reported.   Heparin level therapeutic: 0.35  Goal of Therapy:  Heparin level 0.3-0.7 units/ml Monitor platelets by anticoagulation protocol: Yes   Plan:  Continue heparin gtt at 1000 units/hr Daily Heparin level/CBC Monitor for s/sx bleeding   Georga Bora, PharmD Clinical Pharmacist Pager: [phone] 07/13/2016 4:03 PM

## 2016-07-13 NOTE — Progress Notes (Signed)
DAILY PROGRESS NOTE  Subjective:  Some complaints of "stomach moving into his chest" overnight- thought that this was angina. Nitro increased. Mildly elevated troponin which was flat at 0.07. BNP is low. Labs otherwise unremarkable. EKG this am shows NSR with non-specific T wave changes.  Objective:  Temp:  [97.5 F (36.4 C)-98.2 F (36.8 C)] 98.2 F (36.8 C) (04/01 0700) Pulse Rate:  [59-96] 84 (04/01 1100) Resp:  [10-19] 19 (04/01 1100) BP: (99-180)/(66-119) 115/72 (04/01 1100) SpO2:  [96 %-100 %] 98 % (04/01 1100) Weight:  [183 lb 1.6 oz (83.1 kg)-185 lb (83.9 kg)] 183 lb 1.6 oz (83.1 kg) (03/31 2114) Weight change:   Intake/Output from previous day: 03/31 0701 - 04/01 0700 In: 257.3 [I.V.:257.3] Out: -   Intake/Output from this shift: Total I/O In: 62.9 [I.V.:62.9] Out: -   Medications: No current facility-administered medications on file prior to encounter.    Current Outpatient Prescriptions on File Prior to Encounter  Medication Sig Dispense Refill  . aspirin 81 MG EC tablet Take 81 mg by mouth daily.     Marland Kitchen atorvastatin (LIPITOR) 80 MG tablet Take 1 tablet (80 mg total) by mouth daily. 90 tablet 3  . cholecalciferol (VITAMIN D) 1000 units tablet Take 1,000 Units by mouth daily.    Marland Kitchen HYDROcodone-acetaminophen (NORCO/VICODIN) 5-325 MG tablet Take 1 tablet by mouth every 6 (six) hours as needed for moderate pain.    . metoprolol (LOPRESSOR) 50 MG tablet TAKE 1 TABLET BY MOUTH TWICE A DAY 60 tablet 4  . omega-3 acid ethyl esters (LOVAZA) 1 g capsule Take 500 mg by mouth daily.    . cyclobenzaprine (FLEXERIL) 10 MG tablet Take 1 tablet (10 mg total) by mouth 2 (two) times daily as needed for muscle spasms. 20 tablet 0    Physical Exam: General appearance: alert and no distress Neck: no carotid bruit and no JVD Lungs: clear to auscultation bilaterally Heart: regular rate and rhythm Abdomen: soft, non-tender; bowel sounds normal; no masses,  no  organomegaly Extremities: extremities normal, atraumatic, no cyanosis or edema Pulses: 2+ and symmetric Skin: Skin color, texture, turgor normal. No rashes or lesions Neurologic: Grossly normal Psych: Pleasant  Lab Results: Results for orders placed or performed during the hospital encounter of 07/12/16 (from the past 48 hour(s))  Basic metabolic panel     Status: Abnormal   Collection Time: 07/12/16  5:11 PM  Result Value Ref Range   Sodium 137 135 - 145 mmol/L   Potassium 4.1 3.5 - 5.1 mmol/L   Chloride 103 101 - 111 mmol/L   CO2 27 22 - 32 mmol/L   Glucose, Bld 111 (H) 65 - 99 mg/dL   BUN 20 6 - 20 mg/dL   Creatinine, Ser 0.95 0.61 - 1.24 mg/dL   Calcium 9.3 8.9 - 10.3 mg/dL   GFR calc non Af Amer >60 >60 mL/min   GFR calc Af Amer >60 >60 mL/min    Comment: (NOTE) The eGFR has been calculated using the CKD EPI equation. This calculation has not been validated in all clinical situations. eGFR's persistently <60 mL/min signify possible Chronic Kidney Disease.    Anion gap 7 5 - 15  CBC     Status: None   Collection Time: 07/12/16  5:11 PM  Result Value Ref Range   WBC 7.6 4.0 - 10.5 K/uL   RBC 4.78 4.22 - 5.81 MIL/uL   Hemoglobin 14.5 13.0 - 17.0 g/dL   HCT 42.0 39.0 - 52.0 %  MCV 87.9 78.0 - 100.0 fL   MCH 30.3 26.0 - 34.0 pg   MCHC 34.5 30.0 - 36.0 g/dL   RDW 13.3 11.5 - 15.5 %   Platelets 226 150 - 400 K/uL  Troponin I     Status: Abnormal   Collection Time: 07/12/16  5:11 PM  Result Value Ref Range   Troponin I 0.07 (HH) <0.03 ng/mL    Comment: CRITICAL RESULT CALLED TO, READ BACK BY AND VERIFIED WITH: Masten,T RN @ [id number] ON 07/12/2016 BY Punjtan,G   MRSA PCR Screening     Status: None   Collection Time: 07/12/16  9:12 PM  Result Value Ref Range   MRSA by PCR NEGATIVE NEGATIVE    Comment:        The GeneXpert MRSA Assay (FDA approved for NASAL specimens only), is one component of a comprehensive MRSA colonization surveillance program. It is not intended  to diagnose MRSA infection nor to guide or monitor treatment for MRSA infections.   Troponin I     Status: Abnormal   Collection Time: 07/12/16 10:52 PM  Result Value Ref Range   Troponin I 0.07 (HH) <0.03 ng/mL    Comment: CRITICAL RESULT CALLED TO, READ BACK BY AND VERIFIED WITH: HITT L,RN 07/12/16 2353 WAYK   Brain natriuretic peptide     Status: None   Collection Time: 07/12/16 10:52 PM  Result Value Ref Range   B Natriuretic Peptide 59.1 0.0 - 100.0 pg/mL  Lipid panel     Status: Abnormal   Collection Time: 07/13/16  4:15 AM  Result Value Ref Range   Cholesterol 94 0 - 200 mg/dL   Triglycerides 218 (H) <150 mg/dL   HDL 28 (L) >40 mg/dL   Total CHOL/HDL Ratio 3.4 RATIO   VLDL 44 (H) 0 - 40 mg/dL   LDL Cholesterol 22 0 - 99 mg/dL    Comment:        Total Cholesterol/HDL:CHD Risk Coronary Heart Disease Risk Table                     Men   Women  1/2 Average Risk   3.4   3.3  Average Risk       5.0   4.4  2 X Average Risk   9.6   7.1  3 X Average Risk  23.4   11.0        Use the calculated Patient Ratio above and the CHD Risk Table to determine the patient's CHD Risk.        ATP III CLASSIFICATION (LDL):  <100     mg/dL   Optimal  100-129  mg/dL   Near or Above                    Optimal  130-159  mg/dL   Borderline  160-189  mg/dL   High  >190     mg/dL   Very High   Troponin I     Status: Abnormal   Collection Time: 07/13/16  4:20 AM  Result Value Ref Range   Troponin I 0.07 (HH) <0.03 ng/mL    Comment: CRITICAL VALUE NOTED.  VALUE IS CONSISTENT WITH PREVIOUSLY REPORTED AND CALLED VALUE.  Basic metabolic panel     Status: Abnormal   Collection Time: 07/13/16  4:20 AM  Result Value Ref Range   Sodium 136 135 - 145 mmol/L   Potassium 3.8 3.5 - 5.1 mmol/L   Chloride 103 101 -  111 mmol/L   CO2 25 22 - 32 mmol/L   Glucose, Bld 102 (H) 65 - 99 mg/dL   BUN 17 6 - 20 mg/dL   Creatinine, Ser 0.88 0.61 - 1.24 mg/dL   Calcium 8.6 (L) 8.9 - 10.3 mg/dL   GFR calc  non Af Amer >60 >60 mL/min   GFR calc Af Amer >60 >60 mL/min    Comment: (NOTE) The eGFR has been calculated using the CKD EPI equation. This calculation has not been validated in all clinical situations. eGFR's persistently <60 mL/min signify possible Chronic Kidney Disease.    Anion gap 8 5 - 15  CBC     Status: Abnormal   Collection Time: 07/13/16  4:20 AM  Result Value Ref Range   WBC 9.4 4.0 - 10.5 K/uL   RBC 4.37 4.22 - 5.81 MIL/uL   Hemoglobin 13.0 13.0 - 17.0 g/dL   HCT 38.5 (L) 39.0 - 52.0 %   MCV 88.1 78.0 - 100.0 fL   MCH 29.7 26.0 - 34.0 pg   MCHC 33.8 30.0 - 36.0 g/dL   RDW 13.5 11.5 - 15.5 %   Platelets 202 150 - 400 K/uL  Heparin level (unfractionated)     Status: None   Collection Time: 07/13/16  5:45 AM  Result Value Ref Range   Heparin Unfractionated 0.38 0.30 - 0.70 IU/mL    Comment:        IF HEPARIN RESULTS ARE BELOW EXPECTED VALUES, AND PATIENT DOSAGE HAS BEEN CONFIRMED, SUGGEST FOLLOW UP TESTING OF ANTITHROMBIN III LEVELS.   Troponin I     Status: Abnormal   Collection Time: 07/13/16 10:05 AM  Result Value Ref Range   Troponin I 0.06 (HH) <0.03 ng/mL    Comment: CRITICAL VALUE NOTED.  VALUE IS CONSISTENT WITH PREVIOUSLY REPORTED AND CALLED VALUE.    Imaging: Dg Chest Port 1 View  Result Date: 07/12/2016 CLINICAL DATA:  Patient c/o left chest pain that started yesterday while traveling. Patient reports quadruple bypass 6 months ago. Patient states pain feels like acid reflux, and is radiating to his shoulder. Patient took NTG at [facility] in October 2017, just returned from working in [place] EXAM: PORTABLE CHEST 1 VIEW COMPARISON:  02/20/2016 FINDINGS: Stable changes from previous cardiac surgery. The cardiac silhouette is normal in size and configuration. No mediastinal or hilar masses. No convincing adenopathy. Clear lungs.  No pleural effusion or pneumothorax. Skeletal structures are grossly intact. IMPRESSION: No acute cardiopulmonary  disease. Electronically Signed   By: Lajean Manes M.D.   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 Nitrate responsive chest pain yesterday, very similar to angina prior to CABG. Worse when rushing through the airport to get back to the [place] from the [place] There is a flatly elevated troponin. More chest discomfort today, but improved with a increase in his nitro gtts. Will need repeat LHC tomorrow- could be early graft dysfunction. He is agreeable. Discussed risks, benefits and alternatives. Keep NPO p MN.  Time Spent Directly with Patient:  15 minutes  Length of Stay:  LOS: 1 day   Pixie Casino, MD, [facility] Attending Cardiologist [place] 07/13/2016, 11:23 AM

## 2016-07-14 ENCOUNTER — Inpatient Hospital Stay (HOSPITAL_COMMUNITY): Payer: BLUE CROSS/BLUE SHIELD

## 2016-07-14 ENCOUNTER — Encounter (HOSPITAL_COMMUNITY)
Admission: EM | Disposition: A | Discharge status: Home / Self Care | Payer: Self-pay | Source: Home / Self Care | Attending: Cardiology

## 2016-07-14 DIAGNOSIS — R079 Chest pain, unspecified: Secondary | ICD-10-CM

## 2016-07-14 DIAGNOSIS — I2510 Atherosclerotic heart disease of native coronary artery without angina pectoris: Secondary | ICD-10-CM

## 2016-07-14 HISTORY — PX: LEFT HEART CATH AND CORS/GRAFTS ANGIOGRAPHY: CATH118250

## 2016-07-14 HISTORY — PX: CORONARY STENT INTERVENTION: CATH118234

## 2016-07-14 LAB — ECHOCARDIOGRAM COMPLETE
HEIGHTINCHES: 69 in
WEIGHTICAEL: 2929.6 [oz_av]

## 2016-07-14 LAB — CBC
HEMATOCRIT: 35.9 % — AB (ref 39.0–52.0)
Hemoglobin: 11.8 g/dL — ABNORMAL LOW (ref 13.0–17.0)
MCH: 28.9 pg (ref 26.0–34.0)
MCHC: 32.9 g/dL (ref 30.0–36.0)
MCV: 88 fL (ref 78.0–100.0)
Platelets: 176 10*3/uL (ref 150–400)
RBC: 4.08 MIL/uL — ABNORMAL LOW (ref 4.22–5.81)
RDW: 13.4 % (ref 11.5–15.5)
WBC: 10.2 10*3/uL (ref 4.0–10.5)

## 2016-07-14 LAB — HEMOGLOBIN A1C
HEMOGLOBIN A1C: 5.9 % — AB (ref 4.8–5.6)
Mean Plasma Glucose: 123 mg/dL

## 2016-07-14 LAB — POCT ACTIVATED CLOTTING TIME: ACTIVATED CLOTTING TIME: 395 s

## 2016-07-14 LAB — HEPARIN LEVEL (UNFRACTIONATED): Heparin Unfractionated: 0.35 IU/mL (ref 0.30–0.70)

## 2016-07-14 SURGERY — LEFT HEART CATH AND CORS/GRAFTS ANGIOGRAPHY
Anesthesia: LOCAL

## 2016-07-14 MED ORDER — HYDRALAZINE HCL 20 MG/ML IJ SOLN: 5.0000 mg | INTRAMUSCULAR | Status: DC | PRN

## 2016-07-14 MED ORDER — SODIUM CHLORIDE 0.9 % IV SOLN: 250.0000 mL | INTRAVENOUS | Status: DC | PRN

## 2016-07-14 MED ORDER — CLOPIDOGREL BISULFATE 75 MG PO TABS
75.0000 mg | ORAL_TABLET | Freq: Every day | ORAL | Status: DC
  Administered 2016-07-15: 08:00:00 75 mg via ORAL
  Filled 2016-07-14: qty 1

## 2016-07-14 MED ORDER — HEPARIN (PORCINE) IN NACL 2-0.9 UNIT/ML-% IJ SOLN
INTRAMUSCULAR | Status: AC
  Filled 2016-07-14: qty 1000

## 2016-07-14 MED ORDER — IOPAMIDOL (ISOVUE-370) INJECTION 76%
INTRAVENOUS | Status: DC | PRN
  Administered 2016-07-14: 155 mL via INTRA_ARTERIAL

## 2016-07-14 MED ORDER — IOPAMIDOL (ISOVUE-370) INJECTION 76%
INTRAVENOUS | Status: AC
  Filled 2016-07-14: qty 50

## 2016-07-14 MED ORDER — SODIUM CHLORIDE 0.9% FLUSH: 3.0000 mL | INTRAVENOUS | Status: DC | PRN

## 2016-07-14 MED ORDER — ACETAMINOPHEN 325 MG PO TABS: 650.0000 mg | ORAL_TABLET | ORAL | Status: DC | PRN

## 2016-07-14 MED ORDER — LORAZEPAM 2 MG/ML IJ SOLN
1.0000 mg | INTRAMUSCULAR | Status: AC
Start: 2016-07-14 — End: 2016-07-15

## 2016-07-14 MED ORDER — MIDAZOLAM HCL 2 MG/2ML IJ SOLN
INTRAMUSCULAR | Status: AC
  Filled 2016-07-14: qty 2

## 2016-07-14 MED ORDER — ACETAMINOPHEN 325 MG PO TABS
650.0000 mg | ORAL_TABLET | ORAL | Status: DC | PRN
Start: 2016-07-14 — End: 2016-07-14

## 2016-07-14 MED ORDER — LABETALOL HCL 5 MG/ML IV SOLN: 10.0000 mg | INTRAVENOUS | Status: DC | PRN

## 2016-07-14 MED ORDER — FENTANYL CITRATE (PF) 100 MCG/2ML IJ SOLN
INTRAMUSCULAR | Status: DC | PRN
  Administered 2016-07-14 (×2): 25 ug via INTRAVENOUS

## 2016-07-14 MED ORDER — ATROPINE SULFATE 1 MG/10ML IJ SOSY
PREFILLED_SYRINGE | INTRAMUSCULAR | Status: AC
  Filled 2016-07-14: qty 10

## 2016-07-14 MED ORDER — CLOPIDOGREL BISULFATE 300 MG PO TABS
ORAL_TABLET | ORAL | Status: AC
  Filled 2016-07-14: qty 2

## 2016-07-14 MED ORDER — SODIUM CHLORIDE 0.9 % IV SOLN: INTRAVENOUS | Status: DC

## 2016-07-14 MED ORDER — LIDOCAINE HCL (PF) 1 % IJ SOLN
INTRAMUSCULAR | Status: DC | PRN
  Administered 2016-07-14: 23 mL

## 2016-07-14 MED ORDER — IOPAMIDOL (ISOVUE-370) INJECTION 76%
INTRAVENOUS | Status: AC
  Filled 2016-07-14: qty 125

## 2016-07-14 MED ORDER — NITROGLYCERIN 1 MG/10 ML FOR IR/CATH LAB
INTRA_ARTERIAL | Status: AC
Start: 2016-07-14 — End: 2016-07-14
  Filled 2016-07-14: qty 10

## 2016-07-14 MED ORDER — SODIUM CHLORIDE 0.9 % WEIGHT BASED INFUSION
1.0000 mL/kg/h | INTRAVENOUS | Status: DC
  Administered 2016-07-14: 1 mL/kg/h via INTRAVENOUS

## 2016-07-14 MED ORDER — LIDOCAINE HCL (PF) 1 % IJ SOLN
INTRAMUSCULAR | Status: AC
  Filled 2016-07-14: qty 30

## 2016-07-14 MED ORDER — FENTANYL CITRATE (PF) 100 MCG/2ML IJ SOLN
INTRAMUSCULAR | Status: AC
  Filled 2016-07-14: qty 2

## 2016-07-14 MED ORDER — TRAMADOL HCL 50 MG PO TABS
50.0000 mg | ORAL_TABLET | Freq: Three times a day (TID) | ORAL | Status: DC | PRN
  Administered 2016-07-14 (×3): 50 mg via ORAL
  Filled 2016-07-14 (×3): qty 1

## 2016-07-14 MED ORDER — BIVALIRUDIN BOLUS VIA INFUSION - CUPID
INTRAVENOUS | Status: DC | PRN
  Administered 2016-07-14: 62.325 mg via INTRAVENOUS

## 2016-07-14 MED ORDER — MIDAZOLAM HCL 2 MG/2ML IJ SOLN
INTRAMUSCULAR | Status: DC | PRN
  Administered 2016-07-14: 2 mg via INTRAVENOUS
  Administered 2016-07-14: 1 mg via INTRAVENOUS

## 2016-07-14 MED ORDER — NITROGLYCERIN 1 MG/10 ML FOR IR/CATH LAB
INTRA_ARTERIAL | Status: DC | PRN
  Administered 2016-07-14: 200 ug via INTRACORONARY

## 2016-07-14 MED ORDER — CLOPIDOGREL BISULFATE 300 MG PO TABS
ORAL_TABLET | ORAL | Status: DC | PRN
  Administered 2016-07-14: 600 mg via ORAL

## 2016-07-14 MED ORDER — BIVALIRUDIN 250 MG IV SOLR
INTRAVENOUS | Status: AC
  Filled 2016-07-14: qty 250

## 2016-07-14 MED ORDER — ONDANSETRON HCL 4 MG/2ML IJ SOLN: 4.0000 mg | Freq: Four times a day (QID) | INTRAMUSCULAR | Status: DC | PRN

## 2016-07-14 MED ORDER — ASPIRIN 81 MG PO CHEW
81.0000 mg | CHEWABLE_TABLET | Freq: Every day | ORAL | Status: DC
  Administered 2016-07-15: 10:00:00 81 mg via ORAL
  Filled 2016-07-14: qty 1

## 2016-07-14 MED ORDER — BIVALIRUDIN 250 MG IV SOLR
INTRAVENOUS | Status: DC | PRN
  Administered 2016-07-14: 1.75 mg/kg/h via INTRAVENOUS

## 2016-07-14 MED ORDER — SODIUM CHLORIDE 0.9% FLUSH
3.0000 mL | Freq: Two times a day (BID) | INTRAVENOUS | Status: DC
  Administered 2016-07-14: 3 mL via INTRAVENOUS

## 2016-07-14 MED ORDER — HEART ATTACK BOUNCING BOOK
Freq: Once | Status: AC
  Administered 2016-07-14: 21:00:00
  Filled 2016-07-14: qty 1

## 2016-07-14 MED ORDER — SODIUM CHLORIDE 0.9% FLUSH
3.0000 mL | Freq: Two times a day (BID) | INTRAVENOUS | Status: DC
  Administered 2016-07-14 – 2016-07-15 (×2): 3 mL via INTRAVENOUS

## 2016-07-14 MED ORDER — HEPARIN (PORCINE) IN NACL 2-0.9 UNIT/ML-% IJ SOLN
INTRAMUSCULAR | Status: DC | PRN
  Administered 2016-07-14: 1000 mL

## 2016-07-14 MED ORDER — SODIUM CHLORIDE 0.9% FLUSH
3.0000 mL | Freq: Two times a day (BID) | INTRAVENOUS | Status: DC
  Administered 2016-07-14 – 2016-07-15 (×2): 3 mL via INTRAVENOUS

## 2016-07-14 MED ORDER — ANGIOPLASTY BOOK
Freq: Once | Status: AC
  Administered 2016-07-14: 21:00:00
  Filled 2016-07-14: qty 1

## 2016-07-14 SURGICAL SUPPLY — 17 items
BALLN MOZEC 2.50X20 (BALLOONS) ×2
BALLN ~~LOC~~ MOZEC 2.75X18 (BALLOONS) ×2
BALLOON MOZEC 2.50X20 (BALLOONS) IMPLANT
BALLOON ~~LOC~~ MOZEC 2.75X18 (BALLOONS) IMPLANT
CATH INFINITI 5FR MULTPACK ANG (CATHETERS) ×1 IMPLANT
KIT ENCORE 26 ADVANTAGE (KITS) ×1 IMPLANT
KIT HEART LEFT (KITS) ×2 IMPLANT
PACK CARDIAC CATHETERIZATION (CUSTOM PROCEDURE TRAY) ×2 IMPLANT
SHEATH PINNACLE 5F 10CM (SHEATH) ×1 IMPLANT
SHEATH PINNACLE 6F 10CM (SHEATH) ×1 IMPLANT
STENT PROMUS PREM MR 2.5X38 (Permanent Stent) ×1 IMPLANT
TRANSDUCER W/STOPCOCK (MISCELLANEOUS) ×2 IMPLANT
TUBING CIL FLEX 10 FLL-RA (TUBING) ×2 IMPLANT
VALVE GUARDIAN II ~~LOC~~ HEMO (MISCELLANEOUS) ×1 IMPLANT
WIRE EMERALD 3MM-J .035X150CM (WIRE) ×1 IMPLANT
WIRE FIGHTER CROSSING 190CM (WIRE) ×1 IMPLANT
WIRE HI TORQ VERSACORE-J 145CM (WIRE) ×1 IMPLANT

## 2016-07-14 NOTE — Progress Notes (Signed)
  Echocardiogram 2D Echocardiogram has been performed.  Luke Smith 07/14/2016, 2:20 PM

## 2016-07-14 NOTE — H&P (View-Only) (Signed)
Patient Name: Luke Smith Date of Encounter: 07/14/2016  Primary Cardiologist: Dr. 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 No SOB  Inpatient Medications    Scheduled Meds: . aspirin EC  81 mg Oral Daily  . atorvastatin  80 mg Oral Daily  . cholecalciferol  1,000 Units Oral Daily  . LORazepam  1 mg Intravenous On Call  . metoprolol  50 mg Oral BID  . omega-3 acid ethyl esters  1,000 mg Oral Daily  . sodium chloride flush  3 mL Intravenous Q12H   Continuous Infusions: . sodium chloride 1 mL/kg/hr (07/14/16 0400)  . heparin 1,000 Units/hr (07/14/16 0600)  . nitroGLYCERIN 85 mcg/min (07/14/16 0600)   PRN Meds: sodium chloride, acetaminophen, cyclobenzaprine, nitroGLYCERIN, ondansetron (ZOFRAN) IV, sodium chloride flush, traMADol   Vital Signs    Vitals:   07/14/16 0000 07/14/16 0307 07/14/16 0400 07/14/16 0738  BP: 106/66 113/69 115/70 110/77  Pulse: 77 76 73 87  Resp: (!) 9 17 12 13   Temp:  97.8 F (36.6 C)  98.3 F (36.8 C)  TempSrc:  Oral  Oral  SpO2: 99% 100% 99% 100%  Weight:      Height:        Intake/Output Summary (Last 24 hours) at 07/14/16 0949 Last data filed at 07/14/16 0738  Gross per 24 hour  Intake           953.13 ml  Output             1225 ml  Net          -271.87 ml   Filed Weights   07/12/16 1650 07/12/16 2114  Weight: 185 lb (83.9 kg) 183 lb 1.6 oz (83.1 kg)    Physical Exam    GEN: Well nourished, well developed, in no acute distress.  HEENT: Grossly normal.  Neck: Supple, no JVD, carotid bruits, or masses. Cardiac: RRR, no murmurs, rubs, or gallops. No clubbing, cyanosis, edema.  Radials/DP/PT 2+ and equal bilaterally.  Respiratory:  Respirations regular and unlabored, clear to auscultation bilaterally. GI: Soft, nontender, nondistended, BS + x  4. MS: no deformity or atrophy. Skin: warm and dry, no rash. Neuro:  Strength and sensation are intact. Psych: AAOx3.  Normal affect.  Labs    CBC  Recent Labs  07/13/16 0420 07/14/16 0221  WBC 9.4 10.2  HGB 13.0 11.8*  HCT 38.5* 35.9*  MCV 88.1 88.0  PLT 202 732   Basic Metabolic Panel  Recent Labs  07/12/16 1711 07/13/16 0420  NA 137 136  K 4.1 3.8  CL 103 103  CO2 27 25  GLUCOSE 111* 102*  BUN 20 17  CREATININE 0.95 0.88  CALCIUM 9.3 8.6*   Liver Function Tests No results for input(s): AST, ALT, ALKPHOS, BILITOT, PROT, ALBUMIN in the last 72 hours. No results for input(s): LIPASE, AMYLASE in the last 72 hours. Cardiac Enzymes  Recent Labs  07/12/16 2252 07/13/16 0420 07/13/16 1005  TROPONINI 0.07* 0.07* 0.06*   BNP Invalid input(s): POCBNP D-Dimer No results for input(s): DDIMER in the last 72 hours. Hemoglobin A1C No results for input(s): HGBA1C in the last 72 hours. Fasting Lipid Panel  Recent Labs  07/13/16 0415  CHOL 94  HDL 28*  LDLCALC 22  TRIG 218*  CHOLHDL 3.4   Thyroid Function Tests No results for input(s): TSH, T4TOTAL, T3FREE, THYROIDAB in the last 72 hours.  Invalid input(s): FREET3  Telemetry    NSR - Personally Reviewed  ECG    Compared to 02/08/16, the inferior Q waves now appear somewhat more prominent and lateral TWI have normalized. - Personally Reviewed  Radiology    Dg Chest Port 1 View  Result Date: 07/12/2016 CLINICAL DATA:  Patient c/o left chest pain that started yesterday while traveling. Patient reports quadruple bypass 6 months ago. Patient states pain feels like acid reflux, and is radiating to his shoulder. Patient took NTG at [facility] in October 2017, just returned from working in [place] EXAM: PORTABLE CHEST 1 VIEW COMPARISON:  02/20/2016 FINDINGS: Stable changes from previous cardiac surgery. The cardiac silhouette is normal in size and configuration. No mediastinal or hilar masses. No  convincing adenopathy. Clear lungs.  No pleural effusion or pneumothorax. Skeletal structures are grossly intact. IMPRESSION: No acute cardiopulmonary disease. Electronically Signed   By: Lajean Manes M.D.   On: 07/12/2016 18:01    Cardiac Studies   None yet this admission.    01/16/16 cardiac cath  Severe three-vessel coronary disease with 99% mid RCA serving as a culprit for presentation and non-ST elevation MI. RCA has TIMI grade 2 flow and has left-to-right collaterals.  85% segmental mid LAD stenosis and apical 75% stenosis. The large diagonal contains segmental 60-70% narrowing.  90-95% mid circumflex after the first large obtuse marginal branch origin.  Inferobasal akinesis with mid inferior wall hypokinesis. EF 45-50%. Elevated LVEDP.  Patient Profile     8M former smoker with, HTN, OSA, CAD s/p MI and 4V CABG (LIMA to LAD, RIMA to RCA, SVG to D1, SVG to OM on 01/17/16) who presented to [facility] on 07/12/16 with NSTEMI.   Assessment & Plan    NSTEMI: troponin low and flat 0.07--> 0.07--> 0.07--> 0.06, but having unstable sx similar to prior to bypass surgery. Plan is for cath today. Continue IV heparin and nitro.   I have reviewed the risks, indications, and alternatives to cardiac catheterization and possible angioplasty/stenting with the patient. Risks include but are not limited to bleeding, infection, vascular injury, stroke, myocardial infection, arrhythmia, kidney injury, radiation-related injury in the case of prolonged fluoroscopy use, emergency cardiac surgery, and death. The patient understands the risks of serious complication is low (<8%).   CAD s/p CABG x 4V: continue ASA, statin and BB   HLD: LDL excellent at 22. Continue statin   HTN: BP well controlled today    Signed, Angelena Form, PA-C  07/14/2016, 9:49 AM    Personally seen and examined. Agree with above.  No CP. Had exertional angina previously RRR, CTAB, mildly anxious, no edema.  Prior cabg  scar  NSTEMI  - await cath. Varanasi  - Bb, Statin, ASA  HL   - statin. LDL 22  HTN  - controlled  Candee Furbish, MD

## 2016-07-14 NOTE — Progress Notes (Signed)
Patient Name: Luke Smith Date of Encounter: 07/14/2016  Primary Cardiologist: Dr. 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 No SOB  Inpatient Medications    Scheduled Meds: . aspirin EC  81 mg Oral Daily  . atorvastatin  80 mg Oral Daily  . cholecalciferol  1,000 Units Oral Daily  . LORazepam  1 mg Intravenous On Call  . metoprolol  50 mg Oral BID  . omega-3 acid ethyl esters  1,000 mg Oral Daily  . sodium chloride flush  3 mL Intravenous Q12H   Continuous Infusions: . sodium chloride 1 mL/kg/hr (07/14/16 0400)  . heparin 1,000 Units/hr (07/14/16 0600)  . nitroGLYCERIN 85 mcg/min (07/14/16 0600)   PRN Meds: sodium chloride, acetaminophen, cyclobenzaprine, nitroGLYCERIN, ondansetron (ZOFRAN) IV, sodium chloride flush, traMADol   Vital Signs    Vitals:   07/14/16 0000 07/14/16 0307 07/14/16 0400 07/14/16 0738  BP: 106/66 113/69 115/70 110/77  Pulse: 77 76 73 87  Resp: (!) 9 17 12 13   Temp:  97.8 F (36.6 C)  98.3 F (36.8 C)  TempSrc:  Oral  Oral  SpO2: 99% 100% 99% 100%  Weight:      Height:        Intake/Output Summary (Last 24 hours) at 07/14/16 0949 Last data filed at 07/14/16 0738  Gross per 24 hour  Intake           953.13 ml  Output             1225 ml  Net          -271.87 ml   Filed Weights   07/12/16 1650 07/12/16 2114  Weight: 185 lb (83.9 kg) 183 lb 1.6 oz (83.1 kg)    Physical Exam    GEN: Well nourished, well developed, in no acute distress.  HEENT: Grossly normal.  Neck: Supple, no JVD, carotid bruits, or masses. Cardiac: RRR, no murmurs, rubs, or gallops. No clubbing, cyanosis, edema.  Radials/DP/PT 2+ and equal bilaterally.  Respiratory:  Respirations regular and unlabored, clear to auscultation bilaterally. GI: Soft, nontender, nondistended, BS + x  4. MS: no deformity or atrophy. Skin: warm and dry, no rash. Neuro:  Strength and sensation are intact. Psych: AAOx3.  Normal affect.  Labs    CBC  Recent Labs  07/13/16 0420 07/14/16 0221  WBC 9.4 10.2  HGB 13.0 11.8*  HCT 38.5* 35.9*  MCV 88.1 88.0  PLT 202 673   Basic Metabolic Panel  Recent Labs  07/12/16 1711 07/13/16 0420  NA 137 136  K 4.1 3.8  CL 103 103  CO2 27 25  GLUCOSE 111* 102*  BUN 20 17  CREATININE 0.95 0.88  CALCIUM 9.3 8.6*   Liver Function Tests No results for input(s): AST, ALT, ALKPHOS, BILITOT, PROT, ALBUMIN in the last 72 hours. No results for input(s): LIPASE, AMYLASE in the last 72 hours. Cardiac Enzymes  Recent Labs  07/12/16 2252 07/13/16 0420 07/13/16 1005  TROPONINI 0.07* 0.07* 0.06*   BNP Invalid input(s): POCBNP D-Dimer No results for input(s): DDIMER in the last 72 hours. Hemoglobin A1C No results for input(s): HGBA1C in the last 72 hours. Fasting Lipid Panel  Recent Labs  07/13/16 0415  CHOL 94  HDL 28*  LDLCALC 22  TRIG 218*  CHOLHDL 3.4   Thyroid Function Tests No results for input(s): TSH, T4TOTAL, T3FREE, THYROIDAB in the last 72 hours.  Invalid input(s): FREET3  Telemetry    NSR - Personally Reviewed  ECG    Compared to 02/08/16, the inferior Q waves now appear somewhat more prominent and lateral TWI have normalized. - Personally Reviewed  Radiology    Dg Chest Port 1 View  Result Date: 07/12/2016 CLINICAL DATA:  Patient c/o left chest pain that started yesterday while traveling. Patient reports quadruple bypass 6 months ago. Patient states pain feels like acid reflux, and is radiating to his shoulder. Patient took NTG at [facility] in October 2017, just returned from working in [place] EXAM: PORTABLE CHEST 1 VIEW COMPARISON:  02/20/2016 FINDINGS: Stable changes from previous cardiac surgery. The cardiac silhouette is normal in size and configuration. No mediastinal or hilar masses. No  convincing adenopathy. Clear lungs.  No pleural effusion or pneumothorax. Skeletal structures are grossly intact. IMPRESSION: No acute cardiopulmonary disease. Electronically Signed   By: Lajean Manes M.D.   On: 07/12/2016 18:01    Cardiac Studies   None yet this admission.    01/16/16 cardiac cath  Severe three-vessel coronary disease with 99% mid RCA serving as a culprit for presentation and non-ST elevation MI. RCA has TIMI grade 2 flow and has left-to-right collaterals.  85% segmental mid LAD stenosis and apical 75% stenosis. The large diagonal contains segmental 60-70% narrowing.  90-95% mid circumflex after the first large obtuse marginal branch origin.  Inferobasal akinesis with mid inferior wall hypokinesis. EF 45-50%. Elevated LVEDP.  Patient Profile     52M former smoker with, HTN, OSA, CAD s/p MI and 4V CABG (LIMA to LAD, RIMA to RCA, SVG to D1, SVG to OM on 01/17/16) who presented to [facility] on 07/12/16 with NSTEMI.   Assessment & Plan    NSTEMI: troponin low and flat 0.07--> 0.07--> 0.07--> 0.06, but having unstable sx similar to prior to bypass surgery. Plan is for cath today. Continue IV heparin and nitro.   I have reviewed the risks, indications, and alternatives to cardiac catheterization and possible angioplasty/stenting with the patient. Risks include but are not limited to bleeding, infection, vascular injury, stroke, myocardial infection, arrhythmia, kidney injury, radiation-related injury in the case of prolonged fluoroscopy use, emergency cardiac surgery, and death. The patient understands the risks of serious complication is low (<1%).   CAD s/p CABG x 4V: continue ASA, statin and BB   HLD: LDL excellent at 22. Continue statin   HTN: BP well controlled today    Signed, Angelena Form, PA-C  07/14/2016, 9:49 AM    Personally seen and examined. Agree with above.  No CP. Had exertional angina previously RRR, CTAB, mildly anxious, no edema.  Prior cabg  scar  NSTEMI  - await cath. Varanasi  - Bb, Statin, ASA  HL   - statin. LDL 22  HTN  - controlled  Candee Furbish, MD

## 2016-07-14 NOTE — Progress Notes (Signed)
Pt has been NPO since midnight except for sips with Meds. Hal Neer RN BSN MSN 07/14/2016 @ 15.30

## 2016-07-14 NOTE — Progress Notes (Signed)
Site area: right groin  Site Prior to Removal:  Level 0  Pressure Applied For 20 MINUTES    Minutes Beginning at 2045  Manual:   Yes.    Patient Status During Pull:  Stable   Post Pull Groin Site:  Level 0  Post Pull Instructions Given:  Yes.    Post Pull Pulses Present:  Yes.    Dressing Applied:  Yes.    Comments:  Reported off to Helayne Seminole RN after assessed by her at 2110.

## 2016-07-14 NOTE — Interval H&P Note (Signed)
Cath Lab Visit (complete for each Cath Lab visit)  Clinical Evaluation Leading to the Procedure:   ACS: Yes.    Non-ACS:    Anginal Classification: CCS IV  Anti-ischemic medical therapy: Minimal Therapy (1 class of medications)  Non-Invasive Test Results: No non-invasive testing performed  Prior CABG: Previous CABG    LIMA and RIMA used.  History and Physical Interval Note:  07/14/2016 4:12 PM  Luke Smith  has presented today for surgery, with the diagnosis of NSTEMI  The various methods of treatment have been discussed with the patient and family. After consideration of risks, benefits and other options for treatment, the patient has consented to  Procedure(s): Left Heart Cath and Cors/Grafts Angiography (N/A) as a surgical intervention .  The patient's history has been reviewed, patient examined, no change in status, stable for surgery.  I have reviewed the patient's chart and labs.  Questions were answered to the patient's satisfaction.     Larae Grooms

## 2016-07-14 NOTE — Progress Notes (Signed)
ANTICOAGULATION CONSULT NOTE - Follow Up Consult  Pharmacy Consult for Heparin  Indication: chest pain/ACS  Allergies  Allergen Reactions  . Aspirin Other (See Comments)    Acid reflux   Patient Measurements: Height: 5\' 9"  (175.3 cm) Weight: 183 lb 1.6 oz (83.1 kg) IBW/kg (Calculated) : 70.7  Vital Signs: Temp: 97.9 F (36.6 C) (04/02 1308) Temp Source: Oral (04/02 1308) BP: 109/70 (04/02 1308) Pulse Rate: 87 (04/02 1308)  Labs:  Recent Labs  07/12/16 1711 07/12/16 2252 07/13/16 0420 07/13/16 0545 07/13/16 1005 07/13/16 1440 07/13/16 1600 07/14/16 0221  HGB 14.5  --  13.0  --   --   --   --  11.8*  HCT 42.0  --  38.5*  --   --   --   --  35.9*  PLT 226  --  202  --   --   --   --  176  LABPROT  --   --   --   --   --   --  13.9  --   INR  --   --   --   --   --   --  1.06  --   HEPARINUNFRC  --   --   --  0.38  --  0.35  --  0.35  CREATININE 0.95  --  0.88  --   --   --   --   --   TROPONINI 0.07* 0.07* 0.07*  --  0.06*  --   --   --    Estimated Creatinine Clearance: 94.8 mL/min (by C-G formula based on SCr of 0.88 mg/dL).  Assessment: Luke Grabe Carsonis a 56 y.o.malewith pmhx of CAD, HTN, and MI who transferred from [facility] for chest pain. No anticoagulation prior to admission.Hgb down slightly, PLT also trending down but within normal limits, no bleed reported.   Heparin level therapeutic: 0.35, plan cath today.  Goal of Therapy:  Heparin level 0.3-0.7 units/ml Monitor platelets by anticoagulation protocol: Yes   Plan:  Continue heparin gtt at 1000 units/hr Daily Heparin level/CBC Monitor for s/sx bleeding   Erin Hearing PharmD., BCPS Clinical Pharmacist Pager [phone] 07/14/2016 1:48 PM

## 2016-07-15 ENCOUNTER — Encounter (HOSPITAL_COMMUNITY): Payer: Self-pay | Admitting: Interventional Cardiology

## 2016-07-15 ENCOUNTER — Telehealth: Payer: Self-pay | Admitting: *Deleted

## 2016-07-15 DIAGNOSIS — Z951 Presence of aortocoronary bypass graft: Secondary | ICD-10-CM | POA: Diagnosis present

## 2016-07-15 DIAGNOSIS — Z87891 Personal history of nicotine dependence: Secondary | ICD-10-CM

## 2016-07-15 DIAGNOSIS — I10 Essential (primary) hypertension: Secondary | ICD-10-CM | POA: Diagnosis present

## 2016-07-15 DIAGNOSIS — I25110 Atherosclerotic heart disease of native coronary artery with unstable angina pectoris: Secondary | ICD-10-CM

## 2016-07-15 LAB — BASIC METABOLIC PANEL
ANION GAP: 8 (ref 5–15)
BUN: 11 mg/dL (ref 6–20)
CALCIUM: 8.8 mg/dL — AB (ref 8.9–10.3)
CO2: 25 mmol/L (ref 22–32)
Chloride: 105 mmol/L (ref 101–111)
Creatinine, Ser: 0.95 mg/dL (ref 0.61–1.24)
GFR calc Af Amer: >60 mL/min (ref >60)
Glucose, Bld: 103 mg/dL — ABNORMAL HIGH (ref 65–99)
POTASSIUM: 3.9 mmol/L (ref 3.5–5.1)
SODIUM: 138 mmol/L (ref 135–145)

## 2016-07-15 LAB — CBC
HEMATOCRIT: 36.4 % — AB (ref 39.0–52.0)
HEMOGLOBIN: 12.1 g/dL — AB (ref 13.0–17.0)
MCH: 29.5 pg (ref 26.0–34.0)
MCHC: 33.2 g/dL (ref 30.0–36.0)
MCV: 88.8 fL (ref 78.0–100.0)
Platelets: 158 10*3/uL (ref 150–400)
RBC: 4.1 MIL/uL — ABNORMAL LOW (ref 4.22–5.81)
RDW: 13.2 % (ref 11.5–15.5)
WBC: 8.5 10*3/uL (ref 4.0–10.5)

## 2016-07-15 MED ORDER — CLOPIDOGREL BISULFATE 75 MG PO TABS: 75.0000 mg | ORAL_TABLET | Freq: Every day | ORAL | 4 refills | Status: DC

## 2016-07-15 MED ORDER — TRAZODONE HCL 50 MG PO TABS
50.0000 mg | ORAL_TABLET | Freq: Every day | ORAL | Status: DC
  Administered 2016-07-15: 50 mg via ORAL
  Filled 2016-07-15: qty 1

## 2016-07-15 NOTE — Discharge Summary (Signed)
Discharge Summary    Patient ID: Luke Smith,  MRN: [id number], DOB/AGE: 12/07/1960 56 y.o.  Admit date: 07/12/2016 Discharge date: 07/15/2016  Primary Care Provider: Deland Pretty Smith Primary Cardiologist: Dr. Gwenlyn Smith    Discharge Diagnoses    Principal Problem:   NSTEMI (non-ST elevated myocardial infarction) [facility]) Active Problems:   Sleep apnea   History of smoking   Dyslipidemia   Hypertension   Coronary artery disease   Allergies Allergies  Allergen Reactions  . Aspirin Other (See Comments)    Acid reflux     History of Present Illness     39M former smoker with a history of HTN, OSA, CAD s/p MI and 4V CABG (LIMA to LAD, RIMA to RCA, SVG to D1, SVG to OM on 01/17/16) who presented to [facility] on 07/12/16 with NSTEMI.   Luke Smith was in the [place] the week prior to admission for work when he noticed intermittent chest and left arm discomfort. He had not felt these symptoms since his bypass operation. He flew home on Friday 3/30 and while rushing between flights developed such severe symptoms he took a NTG for the first time and it relieved the pain.  Since then, he had the pain off and on and then developed more severe symptoms 07/12/16 and he went to [place] for further care.   On arrival to [facility], VS were HR 80, RR 18, BP 142/90, 100% on RA. CP 3/10 in severity. ECG demonstrated NSR with old inferior infarct, possible with posterior involvement. Compared to 02/08/16, the inferior Q waves appeared somewhat more prominent and lateral TWI had normalized. Labs were notable for K 4.1, Cr 0.95, TnI 0.07.  CXR demonstrated no acute process.  He was given ASA, heparin, and nitropaste and transferred to [facility] for further management.    Hospital Course     Consultants: none  NSTEMI: troponin low and flat 0.07--> 0.07--> 0.07--> 0.06, but having unstable sx similar to prior to bypass surgery. LHC yesterday showed 99% occl mRCA with patent RIMA-->  RCA, 50% occl D2, SVG-->diag occluded, 85% occl mLAD with patent LIMA--> LAD, 70% dLAD occl beyond LIMA insertion, 95% mid-dist LCx occl s/p PCI/DES, SVG--> OM1 occluded. There was moderate LV dysfunction EF 35-45%. However, 2D ECHO yesterday showed EF 60-65%.  -- Plan is for DAPT with ASA/plavix. There was a question of an ASA allergy but patient said he tolerates a baby aspirin just fine.  -- Groin site stable.  -- He has to travel to the [place] for work every 3 weeks. I have arranged for close hospital follow up for 1.5 weeks. He will be cleared for travel at that time. I will write him a work note to keep him out of work until then.  CAD s/p CABG x 4V (01/2016) and PCI/DES to LCX (07/14/16): continue ASA/plavix, statin and BB   HLD: LDL excellent at 22. Continue statin   HTN: BP has been normal to mildly elevated. Will continue to monitor    The patient has had an uncomplicated hospital course and is recovering well. The femoral catheter site is stable. He has been seen by Dr. Marlou Smith today and deemed ready for discharge home. All follow-up appointments have been scheduled.  A work excuse note was provided as well. Discharge medications are listed below.  _____________  Discharge Vitals Blood pressure (!) 143/89, pulse 98, temperature 98.2 F (36.8 C), temperature source Oral, resp. rate 20, height 5\' 9"  (1.753 m), weight  183 lb 3.2 oz (83.1 kg), SpO2 98 %.  Filed Weights   07/12/16 1650 07/12/16 2114 07/15/16 0510  Weight: 185 lb (83.9 kg) 183 lb 1.6 oz (83.1 kg) 183 lb 3.2 oz (83.1 kg)    Labs & Radiologic Studies     CBC  Recent Labs  07/14/16 0221 07/15/16 0140  WBC 10.2 8.5  HGB 11.8* 12.1*  HCT 35.9* 36.4*  MCV 88.0 88.8  PLT 176 086   Basic Metabolic Panel  Recent Labs  07/13/16 0420 07/15/16 0140  NA 136 138  K 3.8 3.9  CL 103 105  CO2 25 25  GLUCOSE 102* 103*  BUN 17 11  CREATININE 0.88 0.95  CALCIUM 8.6* 8.8*   Liver Function Tests No  results for input(s): AST, ALT, ALKPHOS, BILITOT, PROT, ALBUMIN in the last 72 hours. No results for input(s): LIPASE, AMYLASE in the last 72 hours. Cardiac Enzymes  Recent Labs  07/12/16 2252 07/13/16 0420 07/13/16 1005  TROPONINI 0.07* 0.07* 0.06*   BNP Invalid input(s): POCBNP D-Dimer No results for input(s): DDIMER in the last 72 hours. Hemoglobin A1C  Recent Labs  07/12/16 2252  HGBA1C 5.9*   Fasting Lipid Panel  Recent Labs  07/13/16 0415  CHOL 94  HDL 28*  LDLCALC 22  TRIG 218*  CHOLHDL 3.4   Thyroid Function Tests No results for input(s): TSH, T4TOTAL, T3FREE, THYROIDAB in the last 72 hours.  Invalid input(s): FREET3  Dg Chest Port 1 View  Result Date: 07/12/2016 CLINICAL DATA:  Patient c/o left chest pain that started yesterday while traveling. Patient reports quadruple bypass 6 months ago. Patient states pain feels like acid reflux, and is radiating to his shoulder. Patient took NTG at [facility] in October 2017, just returned from working in [place] EXAM: PORTABLE CHEST 1 VIEW COMPARISON:  02/20/2016 FINDINGS: Stable changes from previous cardiac surgery. The cardiac silhouette is normal in size and configuration. No mediastinal or hilar masses. No convincing adenopathy. Clear lungs.  No pleural effusion or pneumothorax. Skeletal structures are grossly intact. IMPRESSION: No acute cardiopulmonary disease. Electronically Signed   By: Luke Manes M.D.   On: 07/12/2016 18:01     Diagnostic Studies/Procedures    07/15/16 Cardiac cath Procedures  Coronary Stent Intervention  Left Heart Cath and Cors/Grafts Angiography  Conclusion    Mid RCA lesion, 99 %stenosed. RIMA to RCA is patent.  2nd Diag lesion, 50 %stenosed. SVG to diagonal is occluded.  Mid LAD lesion, 85 %stenosed. LIMA to LAD is patent.  Dist LAD lesion, 70 %stenosed beyond LIMA insertion.  Mid Cx to Dist Cx lesion, 95 %stenosed. A STENT PROMUS PREM MR 2.5X38 drug eluting stent  was successfully placed, postdilated to 2.8 mm.  Post intervention, there is a 0% residual stenosis.  There is moderate left ventricular systolic dysfunction.  The left ventricular ejection fraction is 35-45% by visual estimate, with anterolateral hypokinesis.  LV end diastolic pressure is normal.  There is no aortic valve stenosis.  High bifurcation of SFA and profunda femoral artery.  He will need dual antiplatelet therapy. He does have an intolerance to aspirin due to reflux. Hopefully this can be treated either with a coated aspirin or H2 blocker. If he cannot take aspirin, would continue clopidogrel long-term. He'll need aggressive secondary prevention.   No further interventions appear necessary. The disease in the native diagonal does not appear critical at this time. He had typical symptoms with balloon inflations. He'll need aggressive secondary prevention including lipid-lowering therapy and  blood pressure control.   _____________   2D ECHO: 07/14/2016 LV EF: 60% - 65% Study Conclusions - Left ventricle: The cavity size was normal. Wall thickness was normal. Systolic function was normal. The estimated ejection fraction was in the range of 60% to 65%. Wall motion was normal; there were no regional wall motion abnormalities. Doppler parameters are consistent with abnormal left ventricular relaxation (grade 1 diastolic dysfunction). The E/e&' ratio is <8, suggesting normal LV filling pressure. - Mitral valve: Mildly thickened leaflets . There was mild regurgitation. - Left atrium: The atrium was normal in size. - Systemic veins: Not visualized. Impressions: - LVEF 60-65%, normal wall thickness, normal wall motion, diastolic dysfunction, normal LV filling pressure, mild MR, normal LA size.    Disposition   Pt is being discharged home today in good condition.  Follow-up Plans & Appointments    Follow-up Information    Kerin Ransom, [place]  Follow up on 07/25/2016.   Specialties:  Cardiology, Radiology Why:  @ 9am  Contact information: [address] [place] [address] [phone]          Discharge Instructions    Amb Referral to Cardiac Rehabilitation    Complete by:  As directed    Diagnosis:  Coronary Stents      Discharge Medications     Medication List    STOP taking these medications   naproxen sodium 220 MG tablet Commonly known as:  ANAPROX     TAKE these medications   aspirin 81 MG EC tablet Take 81 mg by mouth daily.   atorvastatin 80 MG tablet Commonly known as:  LIPITOR Take 1 tablet (80 mg total) by mouth daily.   clopidogrel 75 MG tablet Commonly known as:  PLAVIX Take 1 tablet (75 mg total) by mouth daily with breakfast. Start taking on:  07/16/2016   cyclobenzaprine 10 MG tablet Commonly known as:  FLEXERIL Take 1 tablet (10 mg total) by mouth 2 (two) times daily as needed for muscle spasms.   HYDROcodone-acetaminophen 5-325 MG tablet Commonly known as:  NORCO/VICODIN Take 1 tablet by mouth every 6 (six) hours as needed (for back pain).   KRILL OIL PO Take 1 capsule by mouth daily.   metoprolol 50 MG tablet Commonly known as:  LOPRESSOR TAKE 1 TABLET BY MOUTH TWICE A DAY   nitroGLYCERIN 0.4 MG SL tablet Commonly known as:  NITROSTAT Place 0.4 mg under the tongue every 5 (five) minutes as needed for chest pain.   vitamin C 1000 MG tablet Take 1,000 mg by mouth 2 (two) times daily.   Vitamin D3 5000 units Caps Take 5,000 Units by mouth daily.       Aspirin prescribed at discharge?  Yes High Intensity Statin Prescribed? (Lipitor 40-80mg  or Crestor 20-40mg ): Yes Beta Blocker Prescribed? Yes For EF 45% or less, Was ACEI/ARB Prescribed? No: EF nl ADP Receptor Inhibitor Prescribed? (i.e. Plavix etc.-Includes Medically Managed Patients): Yes For EF <45%, Aldosterone Inhibitor Prescribed? No: EF nl Was EF assessed during THIS hospitalization? Yes Was  Cardiac Rehab II ordered? (Included Medically managed Patients): Yes   Outstanding Labs/Studies   none  Duration of Discharge Encounter   Greater than 30 minutes including physician time.  Signed, Angelena Form PA-C 07/15/2016, 9:17 AM  Personally seen and examined. Agree with above.  56 year old with NSTEMI, post CABG 10/17. Angina with exertion.   - CABG/CAD - cath OM and Diag grafts down.   - Stent placed to OM. Good result  -  EF on ECHO 55% with lateral wall hypokinesis (Cath est 35-45% but may be underestimated (hand injection))  - DAPT - no asa allergy  - high intensity statin (LDL very low)  - RRR, CTAB, ambulatory, AAO x 3, cath site normal.   - OK with DC  Candee Furbish, MD

## 2016-07-15 NOTE — Progress Notes (Signed)
CARDIAC REHAB PHASE I   PRE:  Rate/Rhythm: 87 SR  BP:  Sitting: 134/85        SaO2: 100 RA  MODE:  Ambulation: 800 ft   POST:  Rate/Rhythm: 100 SR  BP:  Sitting: 133/88         SaO2: 98 RA   Pt ambulated 800 ft on RA, handheld assist, steady gait, tolerated well with no complaints. Completed PCI/stent education.  Reviewed risk factors, anti-platelet therapy, stent card, activity restrictions, ntg, exercise, heart healthy diet, and phase 2 cardiac rehab. Pt verbalized understanding, receptive. Pt agrees to phase 2 cardiac rehab referral, will send to [facility] per pt request. Pt to recliner after walk, call bell within reach.    [phone] Lenna Sciara, RN, BSN 07/15/2016 9:13 AM

## 2016-07-15 NOTE — Progress Notes (Signed)
Patient Name: Luke Smith Date of Encounter: 07/15/2016  Primary Cardiologist: Dr. Serena Croissant Problem List     Principal Problem:   NSTEMI (non-ST elevated myocardial infarction) [facility]) Active Problems:   Sleep apnea   Hx of CABG 01/16/16   Dyslipidemia     Subjective   No chest pain. Feeling much better  Inpatient Medications    Scheduled Meds: . aspirin  81 mg Oral Daily  . aspirin EC  81 mg Oral Daily  . atorvastatin  80 mg Oral Daily  . cholecalciferol  1,000 Units Oral Daily  . clopidogrel  75 mg Oral Q breakfast  . metoprolol  50 mg Oral BID  . omega-3 acid ethyl esters  1,000 mg Oral Daily  . sodium chloride flush  3 mL Intravenous Q12H  . sodium chloride flush  3 mL Intravenous Q12H  . traZODone  50 mg Oral QHS   Continuous Infusions: . nitroGLYCERIN Stopped (07/14/16 1800)   PRN Meds: sodium chloride, acetaminophen, cyclobenzaprine, nitroGLYCERIN, ondansetron (ZOFRAN) IV, ondansetron (ZOFRAN) IV, sodium chloride flush, sodium chloride flush, traMADol   Vital Signs    Vitals:   07/14/16 2300 07/15/16 0000 07/15/16 0510 07/15/16 0700  BP: 128/79 121/83 128/73 (!) 143/89  Pulse: 81 80 96 98  Resp: 13 17 (!) 21 20  Temp:   98.2 F (36.8 C) 98.2 F (36.8 C)  TempSrc:   Oral Oral  SpO2: 91% 98% 100% 98%  Weight:   183 lb 3.2 oz (83.1 kg)   Height:        Intake/Output Summary (Last 24 hours) at 07/15/16 0812 Last data filed at 07/15/16 0100  Gross per 24 hour  Intake          1065.35 ml  Output              675 ml  Net           390.35 ml   Filed Weights   07/12/16 1650 07/12/16 2114 07/15/16 0510  Weight: 185 lb (83.9 kg) 183 lb 1.6 oz (83.1 kg) 183 lb 3.2 oz (83.1 kg)    Physical Exam    GEN: Well nourished, well developed, in no acute distress.  HEENT: Grossly normal.  Neck: Supple, no JVD, carotid bruits, or masses. Cardiac: RRR, no murmurs, rubs, or gallops. No clubbing, cyanosis, edema.  Radials/DP/PT 2+ and equal bilaterally.    Respiratory:  Respirations regular and unlabored, clear to auscultation bilaterally. GI: Soft, nontender, nondistended, BS + x 4. MS: no deformity or atrophy. Skin: warm and dry, no rash. Neuro:  Strength and sensation are intact. Psych: AAOx3.  Normal affect.  Labs    CBC  Recent Labs  07/14/16 0221 07/15/16 0140  WBC 10.2 8.5  HGB 11.8* 12.1*  HCT 35.9* 36.4*  MCV 88.0 88.8  PLT 176 093   Basic Metabolic Panel  Recent Labs  07/13/16 0420 07/15/16 0140  NA 136 138  K 3.8 3.9  CL 103 105  CO2 25 25  GLUCOSE 102* 103*  BUN 17 11  CREATININE 0.88 0.95  CALCIUM 8.6* 8.8*   Liver Function Tests No results for input(s): AST, ALT, ALKPHOS, BILITOT, PROT, ALBUMIN in the last 72 hours. No results for input(s): LIPASE, AMYLASE in the last 72 hours. Cardiac Enzymes  Recent Labs  07/12/16 2252 07/13/16 0420 07/13/16 1005  TROPONINI 0.07* 0.07* 0.06*   BNP Invalid input(s): POCBNP D-Dimer No results for input(s): DDIMER in the last 72 hours. Hemoglobin A1C  Recent Labs  07/12/16 2252  HGBA1C 5.9*   Fasting Lipid Panel  Recent Labs  07/13/16 0415  CHOL 94  HDL 28*  LDLCALC 22  TRIG 218*  CHOLHDL 3.4   Thyroid Function Tests No results for input(s): TSH, T4TOTAL, T3FREE, THYROIDAB in the last 72 hours.  Invalid input(s): FREET3  Telemetry    NSR - Personally Reviewed  ECG    Compared to 02/08/16, the inferior Q waves now appear somewhat more prominent and lateral TWI have normalized. - Personally Reviewed  Radiology    No results found.  Cardiac Studies    07/15/16 Cardiac cath Procedures  Coronary Stent Intervention  Left Heart Cath and Cors/Grafts Angiography  Conclusion    Mid RCA lesion, 99 %stenosed. RIMA to RCA is patent.  2nd Diag lesion, 50 %stenosed. SVG to diagonal is occluded.  Mid LAD lesion, 85 %stenosed. LIMA to LAD is patent.  Dist LAD lesion, 70 %stenosed beyond LIMA insertion.  Mid Cx to Dist Cx lesion, 95  %stenosed. A STENT PROMUS PREM MR 2.5X38 drug eluting stent was successfully placed, postdilated to 2.8 mm.  Post intervention, there is a 0% residual stenosis.  There is moderate left ventricular systolic dysfunction.  The left ventricular ejection fraction is 35-45% by visual estimate, with anterolateral hypokinesis.  LV end diastolic pressure is normal.  There is no aortic valve stenosis.  High bifurcation of SFA and profunda femoral artery.   He will need dual antiplatelet therapy. He does have an intolerance to aspirin due to reflux.  Hopefully this can be treated either with a coated aspirin or H2 blocker.  If he cannot take aspirin, would continue clopidogrel long-term. He'll need aggressive secondary prevention.   No further interventions appear necessary. The disease in the native diagonal does not appear critical at this time. He had typical symptoms with balloon inflations. He'll need aggressive secondary prevention including lipid-lowering therapy and blood pressure control.    2D ECHO: 07/14/2016 LV EF: 60% -   65% Study Conclusions - Left ventricle: The cavity size was normal. Wall thickness was   normal. Systolic function was normal. The estimated ejection   fraction was in the range of 60% to 65%. Wall motion was normal;   there were no regional wall motion abnormalities. Doppler   parameters are consistent with abnormal left ventricular   relaxation (grade 1 diastolic dysfunction). The E/e&' ratio is <8,   suggesting normal LV filling pressure. - Mitral valve: Mildly thickened leaflets . There was mild   regurgitation. - Left atrium: The atrium was normal in size. - Systemic veins: Not visualized. Impressions: - LVEF 60-65%, normal wall thickness, normal wall motion, diastolic   dysfunction, normal LV filling pressure, mild MR, normal LA size.   Patient Profile     76M former smoker with a history of HTN, OSA, CAD s/p MI and 4V CABG (LIMA to LAD, RIMA to RCA,  SVG to D1, SVG to OM on 01/17/16) who presented to [facility] on 07/12/16 with NSTEMI.   Assessment & Plan    NSTEMI: troponin low and flat 0.07--> 0.07--> 0.07--> 0.06, but having unstable sx similar to prior to bypass surgery. LHC yesterday showed 99% occl mRCA with patent RIMA--> RCA, 50% occl D2, SVG-->diag occluded, 85% occl mLAD with patent LIMA--> LAD, 70% dLAD occl beyond LIMA insertion, 95% mid-dist LCx occl s/p PCI/DES, SVG--> OM1 occluded. There was moderate LV dysfunction EF 35-45%. However, 2D ECHO yesterday showed EF 60-65%. -- Plan is for DAPT with  ASA/plavix. There was a question of an ASA allergy but patient said he tolerates a baby aspirin just fine.  -- Groin site stable.  -- He has to travel to the [place] for work every 3 weeks. I have arranged for close hospital follow up for 1.5 weeks. He will be cleared for travel at that time. I will write him a work note to keep him out of work until then.  CAD s/p CABG x 4V (01/2016) and PCI/DES to LCX (07/14/16): continue ASA/plavix, statin and BB   HLD: LDL excellent at 22. Continue statin   HTN: BP mildly elevated today    Signed, Angelena Form, PA-C  07/15/2016, 8:12 AM   Personally seen and examined. Agree with above.  56 year old with NSTEMI  - CABG/CAD - cath OM and Diag grafts down.   - Stent placed to OM. Good result  - EF on ECHO 55% with lateral wall hypokinesis (Cath est 35-45% but may be underestimated (hand injection))  - DAPT - no asa allergy  - high intensity statin (LDL very low)  - RRR, CTAB, ambulatory, AAO x 3, cath site normal.   - OK with DC  Candee Furbish, MD

## 2016-07-15 NOTE — Care Management Note (Signed)
Case Management Note  Patient Details  Name: KALVEN GANIM MRN: [id number] Date of Birth: 1960-12-21  Subjective/Objective:    s/p coronary stent intervention, will be on plavix , NCM will cont to follow for dc needs.                Action/Plan:   Expected Discharge Date:                  Expected Discharge Plan:  Home/Self Care  In-House Referral:     Discharge planning Services  CM Consult  Post Acute Care Choice:    Choice offered to:     DME Arranged:    DME Agency:     HH Arranged:    HH Agency:     Status of Service:  Completed, signed off  If discussed at [employer] of Stay Meetings, dates discussed:    Additional Comments:  Zenon Mayo, RN 07/15/2016, 8:56 AM

## 2016-07-15 NOTE — Progress Notes (Signed)
All discharge instructions discussed with patient and spouse.  Patient instructed on Femoral care, heart failure, NSTEMI, Plavix as well as all revelant medications.  Upcoming appt scheduled for 07/25/2016 @ 0900.  All belongings gathered from patient room.  Patient and spouse had no questions at this time, verbalized understanding of all discharge instructions.

## 2016-07-15 NOTE — Telephone Encounter (Signed)
   Patient contacted regarding discharge from [facility]  On 07/12/2016 - 07/15/2016 (3 days) .  Patient understands to follow up with provider Kerin Ransom on 07-25-16 at [facility].  Patient understands discharge instructions? YES Patient understands medications and regiment? YES Patient understands to bring all medications to this visit? YES  PT WILL COME EARLY AND BRING CURRENT MEDICATIONS LIST

## 2016-07-15 NOTE — Telephone Encounter (Signed)
-----   Message from Eileen Stanford, PA-C sent at 07/15/2016  8:43 AM EDT ----- Needs TOC call for appointment with Lurena Joiner next week. Thanks!!

## 2016-07-15 NOTE — Discharge Instructions (Signed)

## 2016-07-15 NOTE — Telephone Encounter (Signed)
Appt 07-25-16 @9am  with Lurena Joiner

## 2016-07-16 ENCOUNTER — Telehealth (HOSPITAL_COMMUNITY): Payer: Self-pay | Admitting: Internal Medicine

## 2016-07-16 NOTE — Telephone Encounter (Signed)
Patient has active BCBS, insurance benefits verified. BCBS - no co-payment, deductible $400/$0 has been met, out of pocket max $2500/$80.00 has been met, 15% co-insurance, no pre-authorizations and no limit on visits. Passport/reference # [id number].

## 2016-07-17 ENCOUNTER — Telehealth: Payer: Self-pay | Admitting: Cardiovascular Disease

## 2016-07-17 LAB — POCT ACTIVATED CLOTTING TIME: Activated Clotting Time: 164 seconds

## 2016-07-17 NOTE — Telephone Encounter (Signed)
New message   Opal Sidles from [employer] is calling about pt. She is requesting call back from RN.

## 2016-07-17 NOTE — Telephone Encounter (Signed)
Spoke with pt, he gave me verbal permission to talk with jane from [employer]. Spoke with representative from [employer], questions regarding recent hospitalization and return to work answered.

## 2016-07-18 ENCOUNTER — Telehealth: Payer: Self-pay | Admitting: Cardiovascular Disease

## 2016-07-18 NOTE — Telephone Encounter (Signed)
Received FMLA Forms from [employer] on 07/18/16 via Fax.  Patient had already completed signed Auth and Payment to [place] to process.  Sent to [employer] @ [employer] to process and return.  Sent by C[employer] on 07/18/16. lp

## 2016-07-22 ENCOUNTER — Telehealth: Payer: Self-pay | Admitting: Cardiovascular Disease

## 2016-07-22 NOTE — Telephone Encounter (Signed)
Received FMLA Forms ([employer]) back from [employer] @ [employer] for review and sign.  Forms will be given to [facility] for Dr Gwenlyn Found to review and sign.  lp

## 2016-07-25 ENCOUNTER — Ambulatory Visit (INDEPENDENT_AMBULATORY_CARE_PROVIDER_SITE_OTHER): Payer: BLUE CROSS/BLUE SHIELD | Admitting: Cardiology

## 2016-07-25 ENCOUNTER — Encounter: Payer: Self-pay | Admitting: Cardiology

## 2016-07-25 VITALS — BP 116/78 | HR 60 | Ht 69.0 in | Wt 183.8 lb

## 2016-07-25 DIAGNOSIS — Z951 Presence of aortocoronary bypass graft: Secondary | ICD-10-CM

## 2016-07-25 DIAGNOSIS — I2510 Atherosclerotic heart disease of native coronary artery without angina pectoris: Secondary | ICD-10-CM | POA: Insufficient documentation

## 2016-07-25 DIAGNOSIS — E785 Hyperlipidemia, unspecified: Secondary | ICD-10-CM | POA: Diagnosis not present

## 2016-07-25 DIAGNOSIS — I10 Essential (primary) hypertension: Secondary | ICD-10-CM | POA: Diagnosis not present

## 2016-07-25 DIAGNOSIS — Z9861 Coronary angioplasty status: Secondary | ICD-10-CM

## 2016-07-25 DIAGNOSIS — I214 Non-ST elevation (NSTEMI) myocardial infarction: Secondary | ICD-10-CM | POA: Diagnosis not present

## 2016-07-25 MED ORDER — METOPROLOL TARTRATE 25 MG PO TABS: 25.0000 mg | ORAL_TABLET | Freq: Two times a day (BID) | ORAL | 3 refills | Status: DC

## 2016-07-25 MED ORDER — NITROGLYCERIN 0.4 MG SL SUBL: 0.4000 mg | SUBLINGUAL_TABLET | SUBLINGUAL | 1 refills | Status: DC | PRN

## 2016-07-25 NOTE — Assessment & Plan Note (Signed)
Pt admitted with NSTEMI 07/12/16 after traveling out of the country for work. S/P PCI- EF preserved, distal residual CAD- [facility] Rx

## 2016-07-25 NOTE — Assessment & Plan Note (Signed)
S/P native CFX PCI with DES 07/14/16

## 2016-07-25 NOTE — Assessment & Plan Note (Signed)
a.01/17/16: 4V CABG (LIMA to LAD, RIMA to RCA, SVG to D1, SVG to OM)   b. 07/2016: 2/4 patent bypass grafts. SVG--> OM down, s/p DES to OM, SVG--> D2 down but 50% occl D2 with good native flow

## 2016-07-25 NOTE — Patient Instructions (Signed)
Medication Instructions:  DECREASE lopressor to 25 mg two times daily  Follow-Up: Your physician recommends that you schedule a follow-up appointment in: 3 MONTHS with Dr. Gwenlyn Found.   Any Other Special Instructions Will Be Listed Below (If Applicable).     If you need a refill on your cardiac medications before your next appointment, please call your pharmacy.

## 2016-07-25 NOTE — Progress Notes (Signed)
07/25/2016 Luke Smith   28-Nov-1960  [id number]  Primary Physician Luke Pel, MD Primary Cardiologist: Dr Luke Smith  HPI:   56 y/o male, seen by Dr Luke Smith in 2014 after an abnormal stress test. The pt questioned the results of that test. Dr Luke Smith felt he may have microvascular angina. He was not seen by cardiology again till Dr Luke Smith referred him to [place] 11/29/15 for exertional chest pain. An exercise Myoview was done 12/05/15 and was negative. He followed up with Dr Luke Smith 01/15/16. The pt complained of increasing exertional chest pain and Dr Luke Smith set him up for a cath but in the interm the pt presented with ongoing chest pain to the ED later that evening. He ruled in for a NSTEMI-Troponin 10.26. He had a cath 01/16/16 that showed severe 3V CAD and an EF of 45%. He underwent CABG x 4 01/17/16 by Dr Luke Smith.   The pt was seen again after he presented to the [place] in [facility] with chest pain after returning from [place] the country. He was admitted with a NSTEMI and transferred to [facility]. Cath revealed occlusion of 2/4 grafts with the SVG-OM and SVG-Dx occluded. The Dx1 had a 50% narrowing. He underwent PCI with DES to the native CFX with a good result. He does has a residual distal native LAD narrowing of 70%. His EF was preserved- 60-65%.Marland Kitchen He is in the office today for a post op follow up. He has been doing well, no chest pain. He has noticed a decrease in sex drive and quality of his erections.     Current Outpatient Prescriptions  Medication Sig Dispense Refill  . Ascorbic Acid (VITAMIN C) 1000 MG tablet Take 1,000 mg by mouth 2 (two) times daily.    Marland Kitchen aspirin 81 MG EC tablet Take 81 mg by mouth daily.     Marland Kitchen atorvastatin (LIPITOR) 80 MG tablet Take 1 tablet (80 mg total) by mouth daily. 90 tablet 3  . Cholecalciferol (VITAMIN D3) 5000 units CAPS Take 5,000 Units by mouth daily.    . clopidogrel (PLAVIX) 75 MG tablet Take 1 tablet (75 mg total) by mouth daily with breakfast. 90 tablet  4  . Coenzyme Q10 (COQ10) 200 MG CAPS Take 1 capsule by mouth daily.    . cyclobenzaprine (FLEXERIL) 10 MG tablet Take 1 tablet (10 mg total) by mouth 2 (two) times daily as needed for muscle spasms. 20 tablet 0  . HYDROcodone-acetaminophen (NORCO/VICODIN) 5-325 MG tablet Take 1 tablet by mouth every 6 (six) hours as needed (for back pain).     Marland Kitchen KRILL OIL PO Take 1 capsule by mouth daily.    . Magnesium 400 MG CAPS Take 1 tablet by mouth daily.    . metoprolol tartrate (LOPRESSOR) 25 MG tablet Take 1 tablet (25 mg total) by mouth 2 (two) times daily. 180 tablet 3  . nitroGLYCERIN (NITROSTAT) 0.4 MG SL tablet Place 1 tablet (0.4 mg total) under the tongue every 5 (five) minutes as needed for chest pain. 25 tablet 1  . zolpidem (AMBIEN) 10 MG tablet Take 10 mg by mouth at bedtime as needed for sleep.     No current facility-administered medications for this visit.     Allergies  Allergen Reactions  . Aspirin Other (See Comments)    Acid reflux    Past Medical History:  Diagnosis Date  . Coronary artery disease    a.01/17/16: 4V CABG (LIMA to LAD, RIMA to RCA, SVG to D1,  SVG to OM)   b. 07/2016: 2/4 patent bypass grafts. SVG--> OM down, s/p DES to OM, SVG--> D2 down but 50% occl D2 with good native flow  . GERD (gastroesophageal reflux disease)   . Hypertension   . Kidney stone   . Perforated ear drum    left ear  . Sleep apnea     Social History   Social History  . Marital status: Married    Spouse name: N/A  . Number of children: 2  . Years of education: N/A   Occupational History  . engineer    Social History Main Topics  . Smoking status: Former Smoker    Packs/day: 0.50    Years: 20.00    Types: Cigarettes    Quit date: 04/14/2001  . Smokeless tobacco: Never Used  . Alcohol use 3.6 oz/week    6 Cans of beer per week     Comment: social  . Drug use: No  . Sexual activity: Not on file   Other Topics Concern  . Not on file   Social History Narrative   Married  for 37 years.  Works as a Chief Financial Officer for [employer] History  Problem Relation Age of Onset  . Bone cancer Father   . Colon cancer Neg Hx      Review of Systems: General: negative for chills, fever, night sweats or weight changes.  Cardiovascular: negative for chest pain, dyspnea on exertion, edema, orthopnea, palpitations, paroxysmal nocturnal dyspnea or shortness of breath Dermatological: negative for rash Respiratory: negative for cough or wheezing Urologic: negative for hematuria Abdominal: negative for nausea, vomiting, diarrhea, bright red blood per rectum, melena, or hematemesis Neurologic: negative for visual changes, syncope, or dizziness All other systems reviewed and are otherwise negative except as noted above.    Blood pressure 116/78, pulse 60, height 5\' 9"  (1.753 m), weight 183 lb 12.8 oz (83.4 kg).  General appearance: alert, cooperative and no distress Neck: no carotid bruit and no JVD Lungs: clear to auscultation bilaterally Heart: regular rate and rhythm Extremities: mild ecchymosis RFA site, no hematoma Skin: Skin color, texture, turgor normal. No rashes or lesions Neurologic: Grossly normal  EKG NSR 58, Lateral TWI  ASSESSMENT AND PLAN:   NSTEMI (non-ST elevated myocardial infarction) (Luke Smith) Pt admitted with NSTEMI 07/12/16 after traveling out of the country for work. S/P PCI- EF preserved, distal residual CAD- medica Rx  Hx of CABG a.01/17/16: 4V CABG (LIMA to LAD, RIMA to RCA, SVG to D1, SVG to OM)   b. 07/2016: 2/4 patent bypass grafts. SVG--> OM down, s/p DES to OM, SVG--> D2 down but 50% occl D2 with good native flow  CAD S/P percutaneous coronary angioplasty S/P native CFX PCI with DES 07/14/16  Hypertension Controlled  Dyslipidemia Last LDL was 22 (?) 07/13/16   PLAN  I suggested Luke Smith decrease his Lopressor to 25 mg BID, if this doesn't help we'll try Bystolic. He should not do international travel or strenuous exertion for another  month. We'll see him in f/u in 3 months.   Kerin Ransom PA-C 07/25/2016 9:38 AM

## 2016-07-25 NOTE — Assessment & Plan Note (Signed)
Last LDL was 22 (?) 07/13/16

## 2016-07-25 NOTE — Assessment & Plan Note (Signed)
Controlled.  

## 2016-07-31 ENCOUNTER — Telehealth (HOSPITAL_COMMUNITY): Payer: Self-pay | Admitting: Internal Medicine

## 2016-07-31 NOTE — Telephone Encounter (Signed)
I called and left message on voicemail to call office about scheduling and participating in the Cardiac Rehab program. I left my contact information on voicemail to return call.

## 2016-08-06 ENCOUNTER — Telehealth (HOSPITAL_COMMUNITY): Payer: Self-pay | Admitting: Internal Medicine

## 2016-08-06 ENCOUNTER — Encounter (HOSPITAL_COMMUNITY): Payer: Self-pay | Admitting: Internal Medicine

## 2016-08-06 NOTE — Progress Notes (Signed)
No vm available, mailed letter w/CRP to pt... KJ

## 2016-08-07 ENCOUNTER — Other Ambulatory Visit: Payer: Self-pay | Admitting: *Deleted

## 2016-08-07 MED ORDER — ATORVASTATIN CALCIUM 80 MG PO TABS: 80.0000 mg | ORAL_TABLET | Freq: Every day | ORAL | 3 refills | Status: DC

## 2016-08-07 MED ORDER — METOPROLOL TARTRATE 25 MG PO TABS: 25.0000 mg | ORAL_TABLET | Freq: Two times a day (BID) | ORAL | 3 refills | Status: DC

## 2016-08-07 MED ORDER — CLOPIDOGREL BISULFATE 75 MG PO TABS: 75.0000 mg | ORAL_TABLET | Freq: Every day | ORAL | 4 refills | Status: DC

## 2016-08-07 MED ORDER — ASPIRIN 81 MG PO TBEC: 81.0000 mg | DELAYED_RELEASE_TABLET | Freq: Every day | ORAL | 3 refills | Status: DC

## 2016-08-07 NOTE — Telephone Encounter (Signed)
Rx has been sent to the pharmacy electronically. ° °

## 2016-11-05 ENCOUNTER — Encounter: Payer: Self-pay | Admitting: Cardiovascular Disease

## 2016-11-05 ENCOUNTER — Ambulatory Visit (INDEPENDENT_AMBULATORY_CARE_PROVIDER_SITE_OTHER): Payer: BLUE CROSS/BLUE SHIELD | Admitting: Cardiovascular Disease

## 2016-11-05 VITALS — BP 148/88 | HR 72 | Ht 69.0 in | Wt 194.0 lb

## 2016-11-05 DIAGNOSIS — I519 Heart disease, unspecified: Secondary | ICD-10-CM

## 2016-11-05 DIAGNOSIS — E785 Hyperlipidemia, unspecified: Secondary | ICD-10-CM | POA: Diagnosis not present

## 2016-11-05 DIAGNOSIS — Z951 Presence of aortocoronary bypass graft: Secondary | ICD-10-CM | POA: Diagnosis not present

## 2016-11-05 DIAGNOSIS — I10 Essential (primary) hypertension: Secondary | ICD-10-CM

## 2016-11-05 MED ORDER — NITROGLYCERIN 0.4 MG SL SUBL: 0.4000 mg | SUBLINGUAL_TABLET | SUBLINGUAL | 1 refills | Status: AC | PRN

## 2016-11-05 NOTE — Addendum Note (Signed)
Addended by: Therisa Doyne on: 11/05/2016 04:42 PM   Modules accepted: Orders

## 2016-11-05 NOTE — Patient Instructions (Signed)
Medication Instructions: Your physician recommends that you continue on your current medications as directed. Please refer to the Current Medication list given to you today.   Follow-Up: We request that you follow-up in: 6 months with Luke Kilroy, PA-C and in 12 months with Dr Berry.  You will receive a reminder letter in the mail two months in advance. If you don't receive a letter, please call our office to schedule the follow-up appointment.  If you need a refill on your cardiac medications before your next appointment, please call your pharmacy.  

## 2016-11-05 NOTE — Assessment & Plan Note (Signed)
History of dyslipidemia on statin therapy with recent lipid profile performed 07/13/16 to close to 94, LDL 22 HDL 28.

## 2016-11-05 NOTE — Progress Notes (Signed)
11/05/2016 Luke Smith   09-15-1960  [id number]  Primary Physician Luke Pretty, MD Primary Cardiologist: Luke Harp MD Luke Smith  HPI:    Mr. Luke Smith is a fit-appearing 56 year old married Caucasian male father of 2 who works as a Administrator, sports at [employer]. I last saw him in the office 05/07/16.Marland Kitchen He was referred by Dr. Deland Smith for cardiovascular evaluation because of an abnormal MET test. His cardiovascular risk profile is positive for remote tobacco abuse having quit 14 years ago and smoked 10 pack years prior to that. There is no family history of heart disease. Never had a heart attack or stroke. He does have obstructive sleep apnea intolerant to CPAP. I have not seen him back for several years. Over the last month he developed new onset substernal chest burning associated with diaphoresis, shortness of breath and upper extremity radiation. He did undergo colonoscopy and endoscopy recently. This chest pain has been nitrate responsive. A Myoview stress test performed 12/07/15 was low risk evidence of ischemia. There was inferior basal thinning probably related to valve plane artifact. I had originally scheduled to perform cardiac catheterization on him several days later however he was admitted urgently with unstable angina and underwent cardiac catheterization on 01/16/16 by Dr. Tamala Smith revealing three-vessel disease with low normal LV function and inferior wall motion abnormality. The following day he underwent coronary artery bypass grafting X 4 by Dr. Cyndia Smith with a LIMA to his LAD, RIMA to the RCA and vein graft to diagonal branch and obtuse marginal branch. His postoperative course was uncomplicated. He did participate in cardiac rehabilitation and had returned back to work when I saw him last. He was admitted on 07/12/16 with a non-STEMI and underwent cardiac catheterization 2 days later by Dr. Irish Smith . The vein to diagonal branch and obtuse  marginal branch were occluded, RIMA to the RCA and LIMA to the LAD were patent. Interestingly, his EF was 35-40% by ventriculography and 65% by 2-D echo. He is completely asymptomatic on dual antiplatelet therapy. Dr. Irish Smith stented his AV groove circumflex with a Promus premiere drug-eluting stent.   Current Outpatient Prescriptions  Medication Sig Dispense Refill  . Ascorbic Acid (VITAMIN C) 1000 MG tablet Take 1,000 mg by mouth 2 (two) times daily.    Marland Kitchen aspirin 81 MG EC tablet Take 1 tablet (81 mg total) by mouth daily. 90 tablet 3  . atorvastatin (LIPITOR) 80 MG tablet Take 1 tablet (80 mg total) by mouth daily. 90 tablet 3  . Cholecalciferol (VITAMIN D3) 5000 units CAPS Take 5,000 Units by mouth daily.    . clopidogrel (PLAVIX) 75 MG tablet Take 1 tablet (75 mg total) by mouth daily with breakfast. 90 tablet 4  . Coenzyme Q10 (COQ10) 200 MG CAPS Take 1 capsule by mouth daily.    . cyclobenzaprine (FLEXERIL) 10 MG tablet Take 1 tablet (10 mg total) by mouth 2 (two) times daily as needed for muscle spasms. 20 tablet 0  . HYDROcodone-acetaminophen (NORCO/VICODIN) 5-325 MG tablet Take 1 tablet by mouth every 6 (six) hours as needed (for back pain).     Marland Kitchen KRILL OIL PO Take 1 capsule by mouth daily.    . Magnesium 400 MG CAPS Take 1 tablet by mouth daily.    . metoprolol tartrate (LOPRESSOR) 25 MG tablet Take 1 tablet (25 mg total) by mouth 2 (two) times daily. 180 tablet 3  . nitroGLYCERIN (NITROSTAT) 0.4 MG SL tablet Place 1  tablet (0.4 mg total) under the tongue every 5 (five) minutes as needed for chest pain. 25 tablet 1  . zolpidem (AMBIEN) 10 MG tablet Take 10 mg by mouth at bedtime as needed for sleep.     No current facility-administered medications for this visit.     Allergies  Allergen Reactions  . Aspirin Other (See Comments)    Acid reflux    Social History   Social History  . Marital status: Married    Spouse name: N/A  . Number of children: 2  . Years of education:  N/A   Occupational History  . engineer    Social History Main Topics  . Smoking status: Former Smoker    Packs/day: 0.50    Years: 20.00    Types: Cigarettes    Quit date: 04/14/2001  . Smokeless tobacco: Never Used  . Alcohol use 3.6 oz/week    6 Cans of beer per week     Comment: social  . Drug use: No  . Sexual activity: Not on file   Other Topics Concern  . Not on file   Social History Narrative   Married for 37 years.  Works as a Chief Financial Officer for [employer]: General: negative for chills, fever, night sweats or weight changes.  Cardiovascular: negative for chest pain, dyspnea on exertion, edema, orthopnea, palpitations, paroxysmal nocturnal dyspnea or shortness of breath Dermatological: negative for rash Respiratory: negative for cough or wheezing Urologic: negative for hematuria Abdominal: negative for nausea, vomiting, diarrhea, bright red blood per rectum, melena, or hematemesis Neurologic: negative for visual changes, syncope, or dizziness All other systems reviewed and are otherwise negative except as noted above.    Blood pressure (!) 148/88, pulse 72, height '5\' 9"'  (1.753 m), weight 194 lb (88 kg).  General appearance: alert and no distress Neck: no adenopathy, no carotid bruit, no JVD, supple, symmetrical, trachea midline and thyroid not enlarged, symmetric, no tenderness/mass/nodules Lungs: clear to auscultation bilaterally Heart: regular rate and rhythm, S1, S2 normal, no murmur, click, rub or gallop Extremities: extremities normal, atraumatic, no cyanosis or edema  EKG not performed today  ASSESSMENT AND PLAN:   Sleep apnea History of obstructive sleep apnea on CPAP which he benefits from  Dyslipidemia History of dyslipidemia on statin therapy with recent lipid profile performed 07/13/16 to close to 94, LDL 22 HDL 28.  Hypertension History of essential hypertension with blood pressure measured at 148/88. He is on metoprolol. Current  meds at current dosing  Hx of CABG History of coronary artery disease status post cardiac catheterization performed by Dr. Tamala Smith October 2017 revealing three-vessel disease and subsequent coronary artery bypass grafting 01/17/16 with a LIMA to the LAD, RIMA to the RCA, vein to OM and D1. He presented to the hospital 07/12/16 with a non-STEMI and underwent cardiac catheterization several days later by Dr. Irish Smith . His veins were occluded to his OM branch and diagonal branch. He had a high-grade segmental mid AV groove circumflex which was stented by Dr. Illene Bolus with a Promus premiere drug-eluting stent. He remains on aspirin and Plavix and is asymptomatic.      Luke Harp MD FACP,FACC,FAHA, [facility] 11/05/2016 4:04 PM

## 2016-11-05 NOTE — Assessment & Plan Note (Addendum)
History of obstructive sleep apnea on CPAP which he benefits from 

## 2016-11-05 NOTE — Assessment & Plan Note (Signed)
History of essential hypertension with blood pressure measured at 148/88. He is on metoprolol. Current meds at current dosing

## 2016-11-05 NOTE — Assessment & Plan Note (Signed)
History of coronary artery disease status post cardiac catheterization performed by Dr. Tamala Julian October 2017 revealing three-vessel disease and subsequent coronary artery bypass grafting 01/17/16 with a LIMA to the LAD, RIMA to the RCA, vein to OM and D1. He presented to the hospital 07/12/16 with a non-STEMI and underwent cardiac catheterization several days later by Dr. Irish Lack . His veins were occluded to his OM branch and diagonal branch. He had a high-grade segmental mid AV groove circumflex which was stented by Dr. Illene Bolus with a Promus premiere drug-eluting stent. He remains on aspirin and Plavix and is asymptomatic.

## 2016-11-06 ENCOUNTER — Telehealth: Payer: Self-pay | Admitting: Pharmacist Clinician (PhC)/ Clinical Pharmacy Specialist

## 2016-11-06 NOTE — Telephone Encounter (Signed)
Patient in office to see Dr. Gwenlyn Found 11/05/16.  Was concerned about metoprolol causing worsening ED issues.  Reviewed with patient the option of switching to Bystolic, which is noted to have fewer ED issues.  Patient agreeable to this, he has Pharmacist, community and was given information to get copay card from manufacturer.  He would like to wait about 2-3 weeks before making the change, as he will be heading out for 2 weeks vacation and did not want to start new med then leave town.    He will call the office when ready to make switch.  Has been on metoprolol tart 25 mg bid, will have him start with Bystolic 10 mg qd and come in after 2-3 weeks for a CVRR BP evaluation.  Patient agreeable to this.

## 2016-11-10 ENCOUNTER — Other Ambulatory Visit: Payer: Self-pay

## 2016-11-10 ENCOUNTER — Ambulatory Visit (HOSPITAL_COMMUNITY): Payer: BLUE CROSS/BLUE SHIELD | Attending: Internal Medicine

## 2016-11-10 DIAGNOSIS — I051 Rheumatic mitral insufficiency: Secondary | ICD-10-CM | POA: Insufficient documentation

## 2016-11-10 DIAGNOSIS — I5030 Unspecified diastolic (congestive) heart failure: Secondary | ICD-10-CM | POA: Diagnosis not present

## 2016-11-10 DIAGNOSIS — I519 Heart disease, unspecified: Secondary | ICD-10-CM

## 2017-04-30 ENCOUNTER — Telehealth: Payer: Self-pay | Admitting: *Deleted

## 2017-04-30 NOTE — Telephone Encounter (Signed)
   [facility] Pre-operative Risk Assessment    Request for surgical clearance:  1. What type of surgery is being performed? LUMBAR ESI   2. When is this surgery scheduled? 05/07/17   3. Are there any medications that need to be held prior to surgery and how long?PLAVIX   4. Practice name and name of physician performing surgery? [facility]    5. What is your office phone and fax number? PHONE [phone] EXT [place]    6. Anesthesia type (None, local, MAC, general) ?

## 2017-05-01 NOTE — Telephone Encounter (Signed)
I called the pt per Ellen Henri, PA pt will need to cancel procedure until after 07/12/17. Pt advised due to s/p CABG, stents, that he is on ASA and Plavix and cannot stop these medications for 1 full year. Pt thanked me for my call. I advised pt that we have sent info over to [facility].

## 2017-05-01 NOTE — Telephone Encounter (Signed)
   Primary Cardiologist: Quay Burow, MD  Chart reviewed as part of pre-operative protocol coverage. ? Regarding holding Plavix for lumbar injection. Pt w/ h/o CAD s/p CABG and recent PCI + DES to circumflex < 12 months ago. DES was placed 07/12/16. Given risk of InStent thrombosis, he will need to remain on ASA and Plavix, w/o interruption until he has completed 12 months of therapy. I will fax notification that procedure will need to be canceled to ortho MD.   Pre-op covering staff: - Please notify pt that he cannot stop Plavix for injection at this time given recent stent placement within the last 12 months. Will need to delay until possibly the spring.    Lyda Jester, PA-C 05/01/2017, 3:13 PM

## 2017-05-05 ENCOUNTER — Ambulatory Visit: Payer: BLUE CROSS/BLUE SHIELD | Admitting: Cardiology

## 2017-05-05 ENCOUNTER — Encounter: Payer: Self-pay | Admitting: Cardiology

## 2017-05-05 VITALS — BP 132/78 | HR 63 | Ht 69.0 in | Wt 196.2 lb

## 2017-05-05 DIAGNOSIS — I252 Old myocardial infarction: Secondary | ICD-10-CM | POA: Diagnosis not present

## 2017-05-05 DIAGNOSIS — I10 Essential (primary) hypertension: Secondary | ICD-10-CM

## 2017-05-05 DIAGNOSIS — I2510 Atherosclerotic heart disease of native coronary artery without angina pectoris: Secondary | ICD-10-CM | POA: Diagnosis not present

## 2017-05-05 DIAGNOSIS — Z951 Presence of aortocoronary bypass graft: Secondary | ICD-10-CM | POA: Diagnosis not present

## 2017-05-05 DIAGNOSIS — E785 Hyperlipidemia, unspecified: Secondary | ICD-10-CM

## 2017-05-05 DIAGNOSIS — Z9861 Coronary angioplasty status: Secondary | ICD-10-CM

## 2017-05-05 NOTE — Progress Notes (Signed)
05/05/2017 Luke Smith   [birth year]  [id number]  Primary Physician Deland Pretty, MD Primary Cardiologist: Dr Gwenlyn Found  HPI:  58 y/o male, seen by Dr Gwenlyn Found in 2014 after an abnormal stress test. The pt questioned the results of that test. Dr Gwenlyn Found felt he may have microvascular angina. He was not seen by cardiology again till Dr Shelia Media referred him to [place] 11/29/15 for exertional chest pain. An exercise Myoview was done 12/05/15 and was negative. He followed up with Dr Gwenlyn Found 01/15/16. The pt complained of increasing exertional chest pain and Dr Gwenlyn Found set him up for a cath but in the interm the pt presented with ongoing chest pain to the ED later that evening. He ruled in for a NSTEMI-Troponin 10.26. He had a cath 01/16/16 that showed severe 3V CAD and an EF of 45%. He underwent CABG x 4 01/17/16 by Dr Cyndia Bent.   The pt was seen again after he presented to the [place] in [facility] in April 2018 with chest pain. He just returned from working in [place] He ruled in for a NSTEMI. Cath revealed occlusion of 2/4 grafts with the SVG-OM and SVG-Dx occluded, the LIMA-LAD and RIMA to RCA were patent, EF was depressed at cath  But normal by echo. He underwent PCI with DES to the native AV groove CFX. He does has a residual distal native LAD narrowing of 70%. He is in the office today for routine follow up. He has done well since his LOV. He denies any chest pain or SOB.  He has had some back issues and had called about getting an injection. We advised him that he should not stop Plavix if at all possible until he had completed 12 months of treatment. Fortunately his back has gotten better since then.    Current Outpatient Medications  Medication Sig Dispense Refill  . Ascorbic Acid (VITAMIN C) 1000 MG tablet Take 1,000 mg by mouth 2 (two) times daily.    Marland Kitchen aspirin 81 MG EC tablet Take 1 tablet (81 mg total) by mouth daily. 90 tablet 3  . atorvastatin (LIPITOR) 80 MG tablet Take 80 mg by mouth  daily.    . Cholecalciferol (VITAMIN D3) 5000 units CAPS Take 5,000 Units by mouth daily.    . clopidogrel (PLAVIX) 75 MG tablet Take 1 tablet (75 mg total) by mouth daily with breakfast. 90 tablet 4  . cyclobenzaprine (FLEXERIL) 10 MG tablet Take 1 tablet (10 mg total) by mouth 2 (two) times daily as needed for muscle spasms. 20 tablet 0  . HYDROcodone-acetaminophen (NORCO/VICODIN) 5-325 MG tablet Take 1 tablet by mouth every 6 (six) hours as needed (for back pain).     . Magnesium 400 MG CAPS Take 1 tablet by mouth daily.    . metoprolol tartrate (LOPRESSOR) 25 MG tablet Take 1 tablet (25 mg total) by mouth 2 (two) times daily. 180 tablet 3  . nitroGLYCERIN (NITROSTAT) 0.4 MG SL tablet Place 1 tablet (0.4 mg total) under the tongue every 5 (five) minutes as needed for chest pain. 25 tablet 1  . zolpidem (AMBIEN) 10 MG tablet Take 10 mg by mouth at bedtime as needed for sleep.     No current facility-administered medications for this visit.     Allergies  Allergen Reactions  . Aspirin Other (See Comments)    Acid reflux if he takes more than one     Past Medical History:  Diagnosis Date  . Coronary artery disease  a.01/17/16: 4V CABG (LIMA to LAD, RIMA to RCA, SVG to D1, SVG to OM)   b. 07/2016: 2/4 patent bypass grafts. SVG--> OM down, s/p DES to OM, SVG--> D2 down but 50% occl D2 with good native flow  . GERD (gastroesophageal reflux disease)   . Hypertension   . Kidney stone   . Perforated ear drum    left ear  . Sleep apnea     Social History   Socioeconomic History  . Marital status: Married    Spouse name: Not on file  . Number of children: 2  . Years of education: Not on file  . Highest education level: Not on file  Social Needs  . Financial resource strain: Not on file  . Food insecurity - worry: Not on file  . Food insecurity - inability: Not on file  . Transportation needs - medical: Not on file  . Transportation needs - non-medical: Not on file  Occupational  History  . Occupation: Chief Financial Officer  Tobacco Use  . Smoking status: Former Smoker    Packs/day: 0.50    Years: 20.00    Pack years: 10.00    Types: Cigarettes    Last attempt to quit: 04/14/2001    Years since quitting: 16.0  . Smokeless tobacco: Never Used  Substance and Sexual Activity  . Alcohol use: Yes    Alcohol/week: 3.6 oz    Types: 6 Cans of beer per week    Comment: social  . Drug use: No  . Sexual activity: Not on file  Other Topics Concern  . Not on file  Social History Narrative   Married for 37 years.  Works as a Chief Financial Officer for [employer] History  Problem Relation Age of Onset  . Bone cancer Father   . Colon cancer Neg Hx      Review of Systems: General: negative for chills, fever, night sweats or weight changes.  Cardiovascular: negative for chest pain, dyspnea on exertion, edema, orthopnea, palpitations, paroxysmal nocturnal dyspnea or shortness of breath Dermatological: negative for rash Respiratory: negative for cough or wheezing Urologic: negative for hematuria Abdominal: negative for nausea, vomiting, diarrhea, bright red blood per rectum, melena, or hematemesis Neurologic: negative for visual changes, syncope, or dizziness All other systems reviewed and are otherwise negative except as noted above.    Blood pressure 132/78, pulse 63, height 5\' 9"  (1.753 m), weight 196 lb 3.2 oz (89 kg).  General appearance: alert, cooperative and no distress Neck: no carotid bruit and no JVD Lungs: clear to auscultation bilaterally Heart: regular rate and rhythm Extremities: extremities normal, atraumatic, no cyanosis or edema Skin: Skin color, texture, turgor normal. No rashes or lesions Neurologic: Grossly normal  EKG NSR  ASSESSMENT AND PLAN:   Hx of CABG a.01/17/16: 4V CABG (LIMA to LAD, RIMA to RCA, SVG to D1, SVG to OM)   b. 07/2016: 2/4 patent bypass grafts. SVG--> OM down, s/p DES to OM, SVG--> D2 down but 50% occl D2 with good native  flow  NSTEMI (non-ST elevated myocardial infarction) ([place]) Pt admitted with NSTEMI 07/12/16 after traveling out of the country for work. S/P PCI- EF preserved, distal residual CAD- medica Rx  CAD S/P percutaneous coronary angioplasty S/P native CFX PCI with DES 07/14/16  Hypertension Controlled  Dyslipidemia Last LDL was 22  07/13/16   PLAN  Same Rx. We discussed exercise recommendations. He had some issues with ERD attributed to Lopressor but this seems to have resolved. Check CMET  and lipids in 6 months and f/u with Dr Gwenlyn Found.   Kerin Ransom PA-C 05/05/2017 9:27 AM

## 2017-05-05 NOTE — Patient Instructions (Signed)
Medication Instructions:  Your physician recommends that you continue on your current medications as directed. Please refer to the Current Medication list given to you today.  Labwork: FASTING LP/CMET PRIOR TO YOUR FOLLOW UP WITH DR Gwenlyn Found   Testing/Procedures: NONE  Follow-Up: Your physician wants you to follow-up in: [place] will receive a reminder letter in the mail two months in advance. If you don't receive a letter, please call our office to schedule the follow-up appointment.  If you need a refill on your cardiac medications before your next appointment, please call your pharmacy.

## 2017-06-20 IMAGING — CT CT RENAL STONE PROTOCOL
2 of 4 series · 15 of 46 positions shown, 17 images · non-contrast
Comparison: None.

CLINICAL DATA: Acute onset left flank pain this morning. Three
weeks postop from CABG.

EXAM:
CT ABDOMEN AND PELVIS WITHOUT CONTRAST
TECHNIQUE: Multidetector CT imaging of the abdomen and pelvis was performed
following the standard protocol without IV contrast.

[Series 2: axial st · axial · 0.82mm/px · z∈[-493,+2]mm · 12 of 109 slices shown, 14 images]
[im 5/109  soft-tissue]
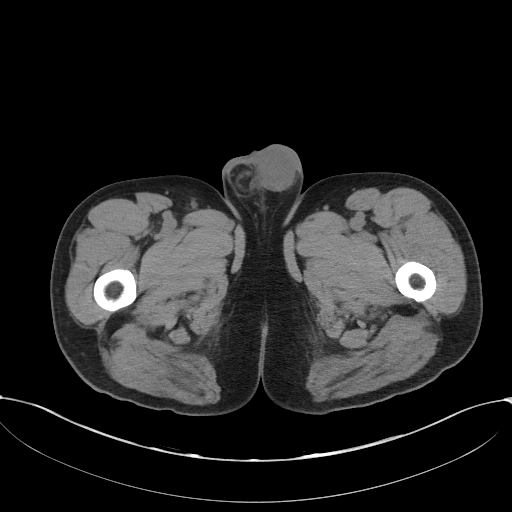
[im 5/109  bone]
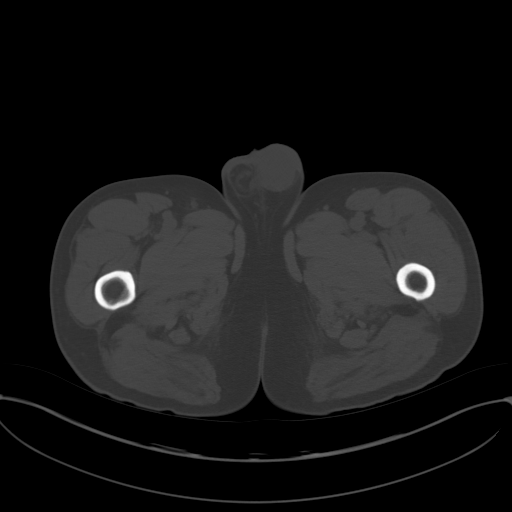
[im 14/109  soft-tissue]
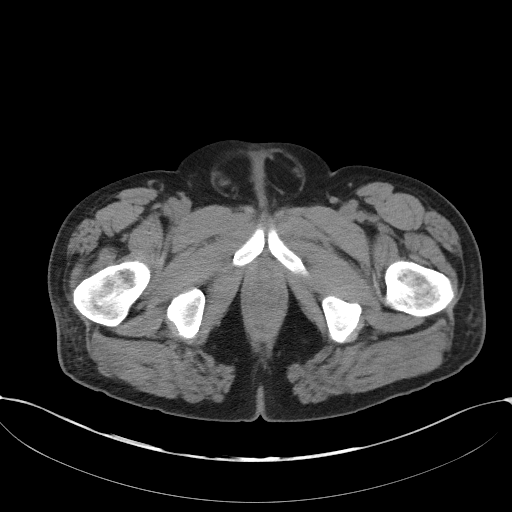
[im 23/109  soft-tissue]
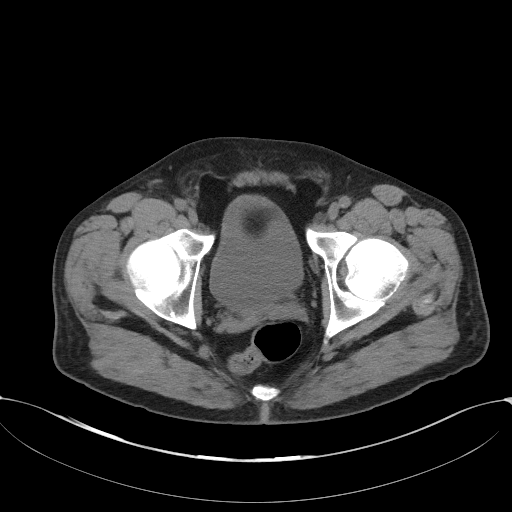
[im 32/109  soft-tissue]
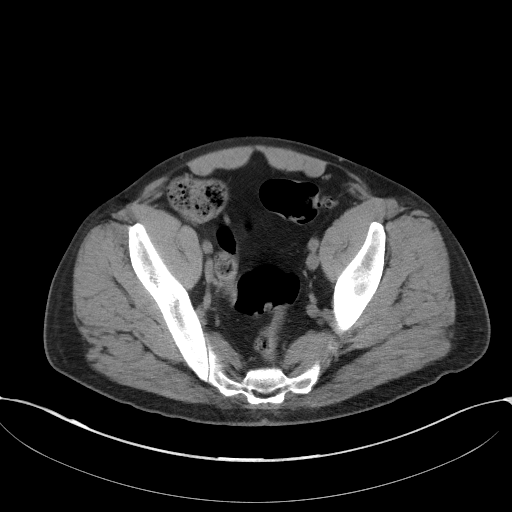
[im 41/109  soft-tissue]
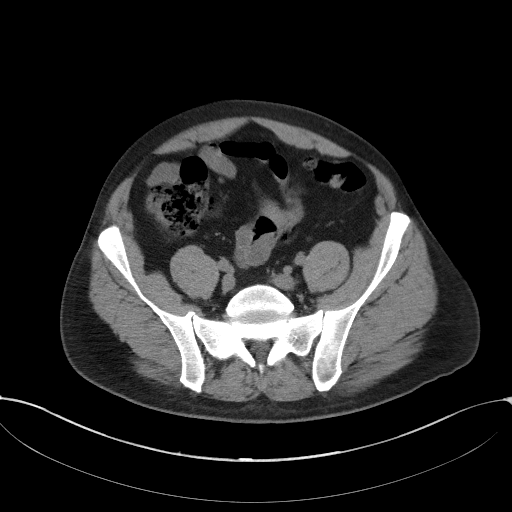
[im 50/109  soft-tissue]
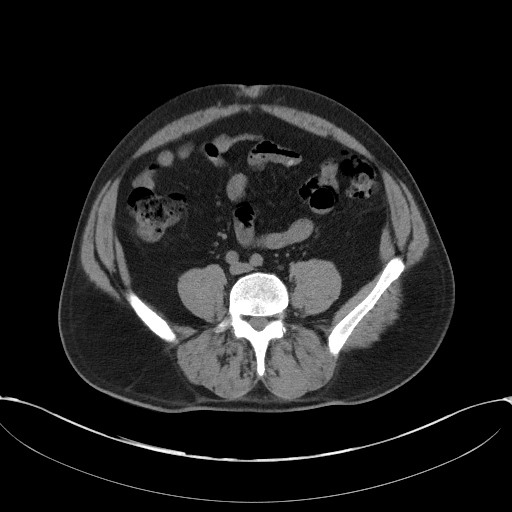
[im 59/109  soft-tissue]
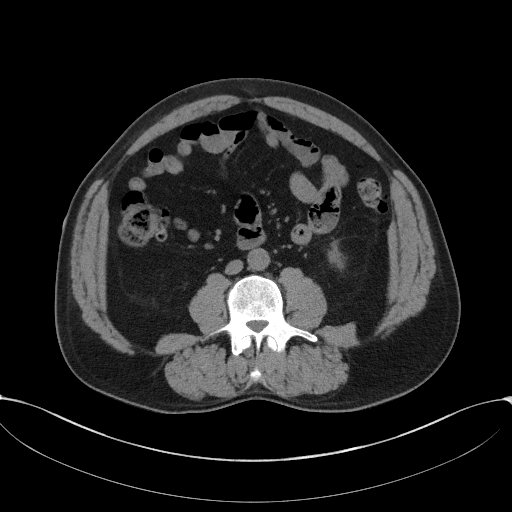
[im 68/109  soft-tissue]
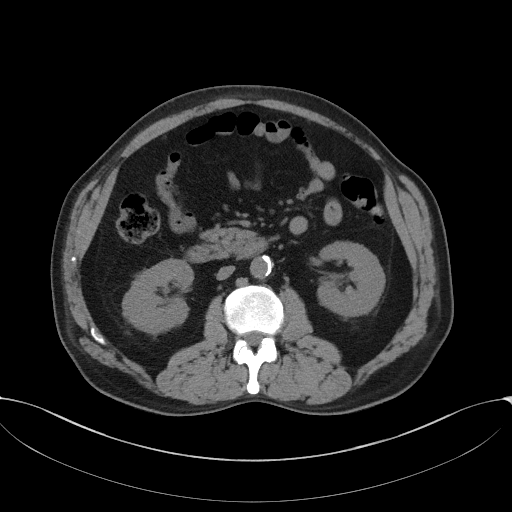
[im 77/109  soft-tissue]
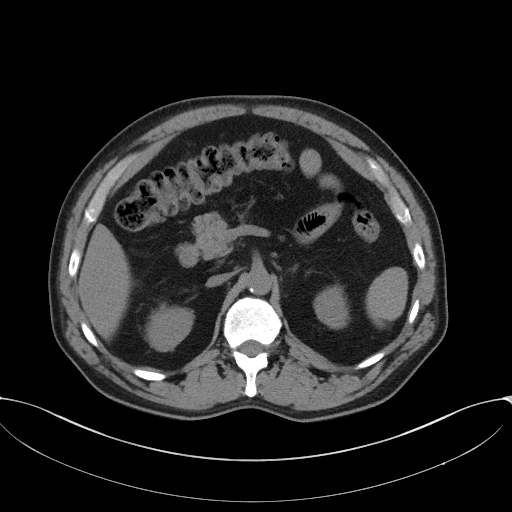
[im 77/109  bone]
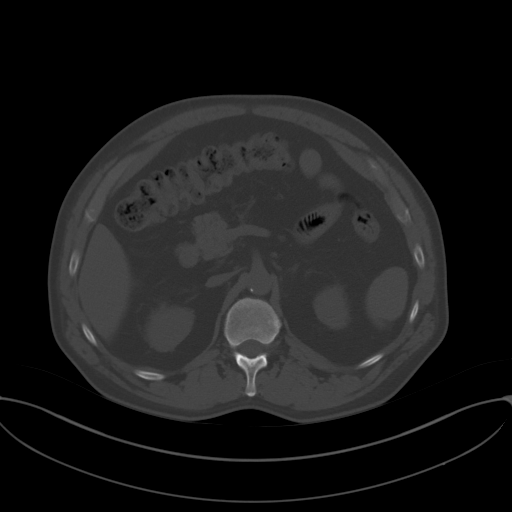
[im 86/109  soft-tissue]
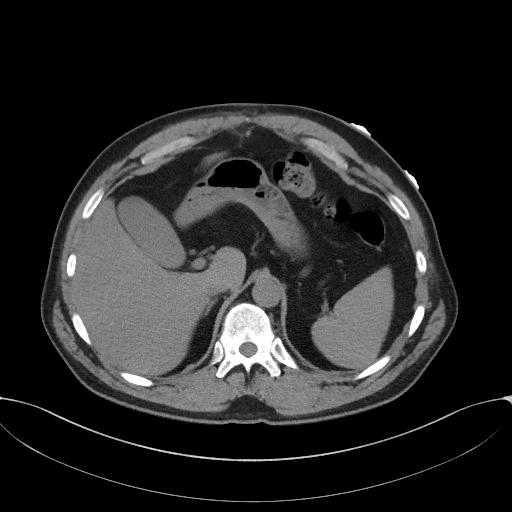
[im 95/109  soft-tissue]
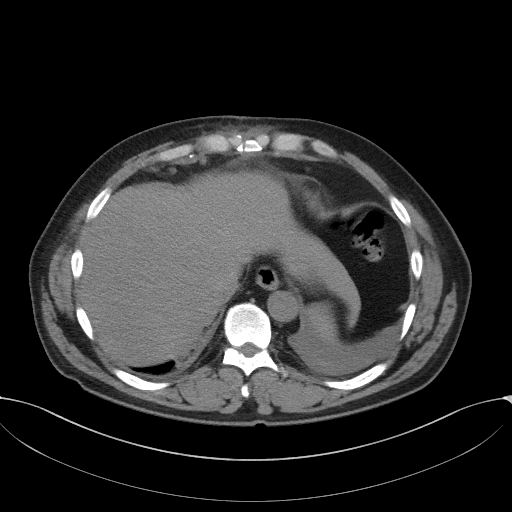
[im 104/109  soft-tissue]
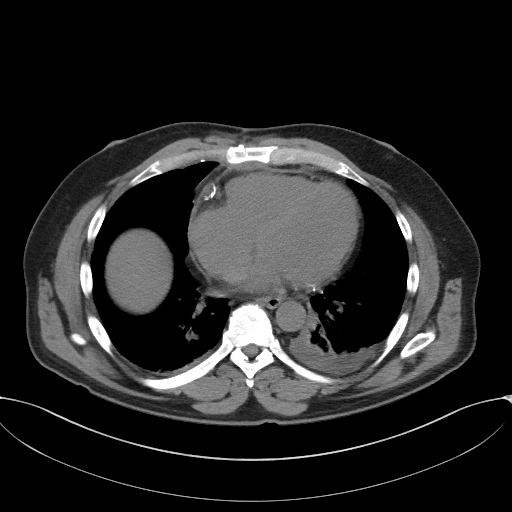

[Series 4: coronal st · coronal · 0.71mm/px · 3 of 107 slices shown]
[im 36/107  soft-tissue]
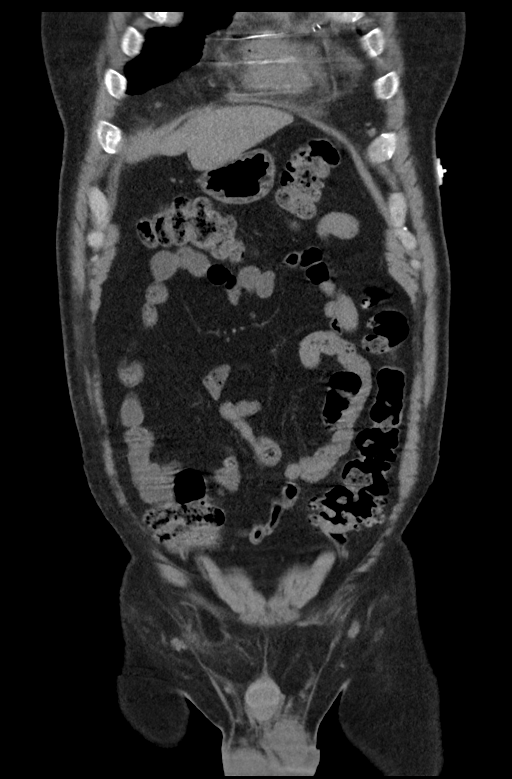
[im 48/107  soft-tissue]
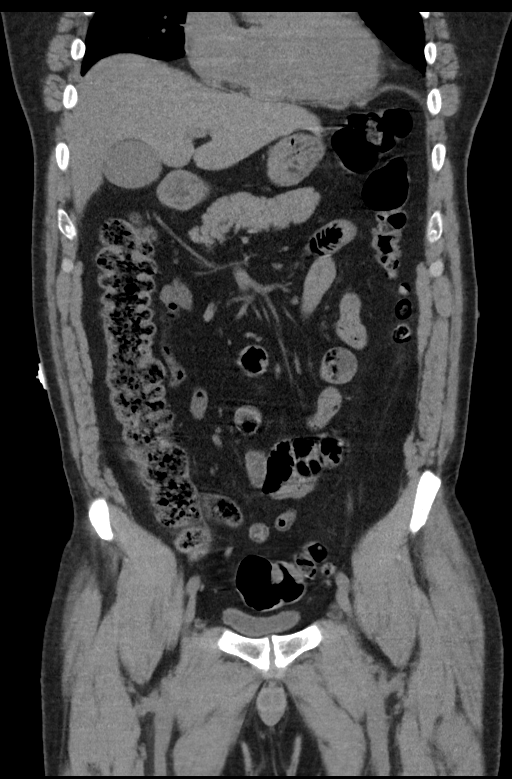
[im 59/107  soft-tissue]
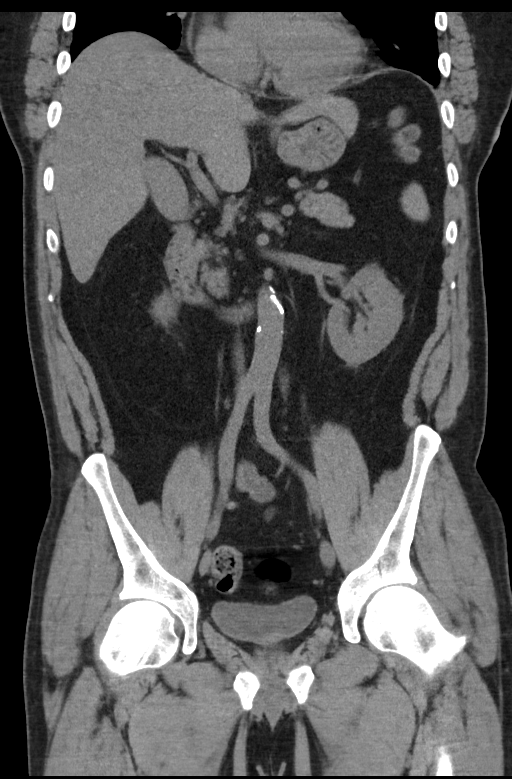

[15 of 46 positions shown; findings below may reference images not displayed]

FINDINGS: Lower chest: Tiny left pleural effusion and mild bilateral lower
lobe atelectasis noted.

Hepatobiliary: No mass visualized on this unenhanced exam.
Gallbladder is unremarkable.

Pancreas: No mass or inflammatory process visualized on this
unenhanced exam.

Spleen:  Within normal limits in size.

Adrenals/Urinary tract: 7 mm calculus is seen in midpole of left
kidney and 7 mm calculus is seen in lower pole of right kidney. No
evidence of hydronephrosis. No evidence of ureteral calculi or
dilatation. Unopacified urinary bladder is unremarkable in
appearance.

Stomach/Bowel: No evidence of obstruction, inflammatory process, or
abnormal fluid collections. Colonic diverticulosis is noted, however
there is no evidence of diverticulitis. Normal appendix visualized.

Vascular/Lymphatic: No pathologically enlarged lymph nodes
identified. No evidence of abdominal aortic aneurysm. Aortic
atherosclerosis.

Reproductive:  No mass or other significant abnormality.

Other: Small bilateral inguinal hernias are seen containing only
fat. No evidence of herniated bowel loops.

Musculoskeletal:  No suspicious bone lesions identified.
IMPRESSION: Bilateral nonobstructive renal calculi. No evidence of ureteral
calculi or hydronephrosis.

Colonic diverticulosis. No radiographic evidence of diverticulitis.

Small fat containing bilateral inguinal hernias.

Tiny left pleural effusion and mild bilateral lower lobe
atelectasis.

Aortic atherosclerosis.

## 2017-07-13 ENCOUNTER — Other Ambulatory Visit: Payer: Self-pay | Admitting: Cardiovascular Disease

## 2017-07-13 NOTE — Telephone Encounter (Signed)
Rx(s) sent to pharmacy electronically.  

## 2017-07-23 ENCOUNTER — Other Ambulatory Visit: Payer: Self-pay | Admitting: Cardiology

## 2017-07-23 NOTE — Telephone Encounter (Signed)
New Message   *STAT* If patient is at the pharmacy, call can be transferred to refill team.   1. Which medications need to be refilled? (please list name of each medication and dose if known) metoprolol tartrate (LOPRESSOR) 25 MG tablet, atorvastatin (LIPITOR) 80 MG tablet and clopidogrel (PLAVIX) 75 MG tablet  2. Which pharmacy/location (including street and city if local pharmacy) is medication to be sent to? [facility] [place], [place]  3. Do they need a 30 day or 90 day supply? [place]

## 2017-07-24 MED ORDER — CLOPIDOGREL BISULFATE 75 MG PO TABS: 75.0000 mg | ORAL_TABLET | Freq: Every day | ORAL | 2 refills | Status: DC

## 2017-07-24 NOTE — Telephone Encounter (Signed)
Metoprolol tartate and lipitor refilled on 07/13/17  Rx(s) sent to pharmacy electronically for plavix

## 2017-10-08 ENCOUNTER — Telehealth: Payer: Self-pay | Admitting: Cardiovascular Disease

## 2017-10-08 NOTE — Telephone Encounter (Signed)
Received records from [facility], on 10/08/17, Appt 10/23/17 @ 3:00PM. [place]

## 2017-10-12 ENCOUNTER — Telehealth: Payer: Self-pay | Admitting: Cardiovascular Disease

## 2017-10-12 NOTE — Telephone Encounter (Signed)
Returned the call to the patient. He stated that he had his recent labs faxed over to the office last Thursday and would like to know if he needs anymore labs drawn at this point. He has his follow up with Dr. Gwenlyn Found on 10/23/17.

## 2017-10-12 NOTE — Telephone Encounter (Signed)
Spoke with pt, he had his cholesterol checked with his PCP, he will bring that to his appointment.

## 2017-10-12 NOTE — Telephone Encounter (Signed)
New Message:      Pt states he is calling to see if we can accept the labs he did on the week of 09/28/17 that he had done at his PCP Dr. Deland Pretty so he won't have to get labs done again.

## 2017-10-23 ENCOUNTER — Ambulatory Visit: Payer: BLUE CROSS/BLUE SHIELD | Admitting: Cardiovascular Disease

## 2017-10-23 ENCOUNTER — Encounter: Payer: Self-pay | Admitting: Cardiovascular Disease

## 2017-10-23 VITALS — BP 126/70 | HR 63 | Ht 69.0 in | Wt 190.0 lb

## 2017-10-23 DIAGNOSIS — Z87891 Personal history of nicotine dependence: Secondary | ICD-10-CM | POA: Diagnosis not present

## 2017-10-23 DIAGNOSIS — Z9861 Coronary angioplasty status: Secondary | ICD-10-CM | POA: Diagnosis not present

## 2017-10-23 DIAGNOSIS — I2510 Atherosclerotic heart disease of native coronary artery without angina pectoris: Secondary | ICD-10-CM | POA: Diagnosis not present

## 2017-10-23 DIAGNOSIS — E785 Hyperlipidemia, unspecified: Secondary | ICD-10-CM

## 2017-10-23 DIAGNOSIS — I10 Essential (primary) hypertension: Secondary | ICD-10-CM

## 2017-10-23 DIAGNOSIS — Z951 Presence of aortocoronary bypass graft: Secondary | ICD-10-CM

## 2017-10-23 DIAGNOSIS — G4733 Obstructive sleep apnea (adult) (pediatric): Secondary | ICD-10-CM

## 2017-10-23 NOTE — Assessment & Plan Note (Signed)
History of dyslipidemia on statin therapy with lipid profile performed by his PCP 10/01/2017 revealing total cholesterol 150, LDL 78 and HDL of 35.

## 2017-10-23 NOTE — Assessment & Plan Note (Signed)
History of essential hypertension her blood pressure measured at 126/70.  He is on Bystolic.  Continue current meds at current dosing.

## 2017-10-23 NOTE — Assessment & Plan Note (Signed)
History of CAD status post cardiac catheterization performed by Dr. Tamala Julian 01/16/2016 revealing three-vessel disease with low normal LV function.  He underwent coronary artery bypass grafting x4 by Dr. Cyndia Bent with a LIMA to his LAD, RIMA to the RCA, vein to diagonal branch and obtuse marginal branches.  He was readmitted with a non-STEMI 07/12/2016 revealing occluded vein grafts with patent IMA grafts.  Dr. Irish Lack then stented his AV groove circumflex successfully.  His EF improved from 35 to 40% up to normal by subsequent 2D echo.  He is remained on dual antiplatelet therapy and is completely asymptomatic.

## 2017-10-23 NOTE — Assessment & Plan Note (Signed)
History of remote tobacco abuse having quit 16 years ago.

## 2017-10-23 NOTE — Patient Instructions (Signed)
Your physician wants you to follow-up in: ONE YEAR WITH DR BERRY You will receive a reminder letter in the mail two months in advance. If you don't receive a letter, please call our office to schedule the follow-up appointment.   If you need a refill on your cardiac medications before your next appointment, please call your pharmacy.  

## 2017-10-23 NOTE — Assessment & Plan Note (Signed)
History of obstructive sleep apnea on CPAP. 

## 2017-10-23 NOTE — Progress Notes (Signed)
10/23/2017 Luke Smith   [birth year]  [id number]  Primary Physician Deland Pretty, MD Primary Cardiologist: Lorretta Harp MD Luke Smith, [place]  HPI:  Luke Smith is a 57 y.o.  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 I last saw him in the office  11/05/2016.Marland Kitchen He was referred by Dr. Deland Pretty for cardiovascular evaluation because of an abnormal MET test. His cardiovascular risk profile is positive for remote tobacco abuse having quit 14 years ago and smoked 10 pack years prior to that. There is no family history of heart disease. Never had a heart attack or stroke. He does have obstructive sleep apnea intolerant to CPAP. I have not seen him back for several years. Over the last month he developed new onset substernal chest burning associated with diaphoresis, shortness of breath and upper extremity radiation. He did undergo colonoscopy and endoscopy recently. This chest pain has been nitrate responsive. A Myoview stress test performed 12/07/15 was low risk evidence of ischemia. There was inferior basal thinning probably related to valve plane artifact. I had originally scheduled to perform cardiac catheterization on him several days later however he was admitted urgently with unstable angina and underwent cardiac catheterization on 01/16/16 by Dr. Tamala Julian revealing three-vessel disease with low normal LV function and inferior wall motion abnormality. The following day he underwent coronary artery bypass grafting X 4 by Dr. Cyndia Bent with a LIMA to his LAD, RIMA to the RCA and vein graft to diagonal branch and obtuse marginal branch. His postoperative course was uncomplicated. He did participate in cardiac rehabilitation and had returned back to work when I saw him last. He was admitted on 07/12/16 with a non-STEMI and underwent cardiac catheterization 2 days later by Dr. Irish Lack . The vein to diagonal branch and obtuse marginal  branch were occluded, RIMA to the RCA and LIMA to the LAD were patent. Interestingly, his EF was 35-40% by ventriculography and 65% by 2-D echo. He is completely asymptomatic on dual antiplatelet therapy. Dr. Irish Lack stented his AV groove circumflex with a Promus premiere drug-eluting stent.  Since I saw him a year ago he is remained asymptomatic.  He denies chest pain or shortness of breath.   Current Meds  Medication Sig  . Ascorbic Acid (VITAMIN C) 1000 MG tablet Take 1,000 mg by mouth 2 (two) times daily.  Marland Kitchen aspirin (ASPIRIN LOW DOSE) 81 MG EC tablet Take 1 tablet (81 mg total) by mouth daily.  Marland Kitchen atorvastatin (LIPITOR) 80 MG tablet Take 80 mg by mouth daily.  Marland Kitchen atorvastatin (LIPITOR) 80 MG tablet Take 1 tablet (80 mg total) by mouth daily.  . Cholecalciferol (VITAMIN D3) 5000 units CAPS Take 5,000 Units by mouth daily.  . clopidogrel (PLAVIX) 75 MG tablet Take 1 tablet (75 mg total) by mouth daily with breakfast.  . cyclobenzaprine (FLEXERIL) 10 MG tablet Take 1 tablet (10 mg total) by mouth 2 (two) times daily as needed for muscle spasms.  Marland Kitchen HYDROcodone-acetaminophen (NORCO/VICODIN) 5-325 MG tablet Take 1 tablet by mouth every 6 (six) hours as needed (for back pain).   . Magnesium 400 MG CAPS Take 1 tablet by mouth daily.  . metoprolol tartrate (LOPRESSOR) 25 MG tablet Take 1 tablet (25 mg total) by mouth 2 (two) times daily.  . nebivolol (BYSTOLIC) 5 MG tablet Take 5 mg by mouth daily.  . nitroGLYCERIN (NITROSTAT) 0.4 MG SL tablet Place 1 tablet (0.4 mg total)  under the tongue every 5 (five) minutes as needed for chest pain.  Marland Kitchen SILDENAFIL CITRATE PO Take 1-5 tablets by mouth as needed.  . zolpidem (AMBIEN) 10 MG tablet Take 10 mg by mouth at bedtime as needed for sleep.     Allergies  Allergen Reactions  . Aspirin Other (See Comments)    Acid reflux if he takes more than one     Social History   Socioeconomic History  . Marital status: Married    Spouse name: Not on file  .  Number of children: 2  . Years of education: Not on file  . Highest education level: Not on file  Occupational History  . Occupation: Chief Financial Officer  Social Needs  . Financial resource strain: Not on file  . Food insecurity:    Worry: Not on file    Inability: Not on file  . Transportation needs:    Medical: Not on file    Non-medical: Not on file  Tobacco Use  . Smoking status: Former Smoker    Packs/day: 0.50    Years: 20.00    Pack years: 10.00    Types: Cigarettes    Last attempt to quit: 04/14/2001    Years since quitting: 16.5  . Smokeless tobacco: Never Used  Substance and Sexual Activity  . Alcohol use: Yes    Alcohol/week: 3.6 oz    Types: 6 Cans of beer per week    Comment: social  . Drug use: No  . Sexual activity: Not on file  Lifestyle  . Physical activity:    Days per week: Not on file    Minutes per session: Not on file  . Stress: Not on file  Relationships  . Social connections:    Talks on phone: Not on file    Gets together: Not on file    Attends religious service: Not on file    Active member of club or organization: Not on file    Attends meetings of clubs or organizations: Not on file    Relationship status: Not on file  . Intimate partner violence:    Fear of current or ex partner: Not on file    Emotionally abused: Not on file    Physically abused: Not on file    Forced sexual activity: Not on file  Other Topics Concern  . Not on file  Social History Narrative   Married for 37 years.  Works as a Chief Financial Officer for [employer]: General: negative for chills, fever, night sweats or weight changes.  Cardiovascular: negative for chest pain, dyspnea on exertion, edema, orthopnea, palpitations, paroxysmal nocturnal dyspnea or shortness of breath Dermatological: negative for rash Respiratory: negative for cough or wheezing Urologic: negative for hematuria Abdominal: negative for nausea, vomiting, diarrhea, bright red blood per rectum,  melena, or hematemesis Neurologic: negative for visual changes, syncope, or dizziness All other systems reviewed and are otherwise negative except as noted above.    Blood pressure 126/70, pulse 63, height 5' 9" (1.753 m), weight 190 lb (86.2 kg).  General appearance: alert and no distress Neck: no adenopathy, no carotid bruit, no JVD, supple, symmetrical, trachea midline and thyroid not enlarged, symmetric, no tenderness/mass/nodules Lungs: clear to auscultation bilaterally Heart: regular rate and rhythm, S1, S2 normal, no murmur, click, rub or gallop Extremities: extremities normal, atraumatic, no cyanosis or edema Pulses: 2+ and symmetric Skin: Skin color, texture, turgor normal. No rashes or lesions Neurologic: Alert and oriented X 3, normal  strength and tone. Normal symmetric reflexes. Normal coordination and gait  EKG sinus rhythm at 63 without ST or T wave changes.  Personally reviewed this EKG.  ASSESSMENT AND PLAN:   Sleep apnea History of obstructive sleep apnea on CPAP  History of smoking History of remote tobacco abuse having quit 16 years ago.  Dyslipidemia History of dyslipidemia on statin therapy with lipid profile performed by his PCP 10/01/2017 revealing total cholesterol 150, LDL 78 and HDL of 35.  Hypertension History of essential hypertension her blood pressure measured at 126/70.  He is on Bystolic.  Continue current meds at current dosing.  Hx of CABG History of CAD status post cardiac catheterization performed by Dr. Tamala Julian 01/16/2016 revealing three-vessel disease with low normal LV function.  He underwent coronary artery bypass grafting x4 by Dr. Cyndia Bent with a LIMA to his LAD, RIMA to the RCA, vein to diagonal branch and obtuse marginal branches.  He was readmitted with a non-STEMI 07/12/2016 revealing occluded vein grafts with patent IMA grafts.  Dr. Irish Lack then stented his AV groove circumflex successfully.  His EF improved from 35 to 40% up to normal by  subsequent 2D echo.  He is remained on dual antiplatelet therapy and is completely asymptomatic.      Lorretta Harp MD FACP,FACC,FAHA, [facility] 10/23/2017 3:29 PM

## 2018-05-06 ENCOUNTER — Telehealth: Payer: Self-pay | Admitting: Cardiovascular Disease

## 2018-05-06 MED ORDER — ATORVASTATIN CALCIUM 80 MG PO TABS: 80.0000 mg | ORAL_TABLET | Freq: Every day | ORAL | 3 refills | Status: DC

## 2018-05-06 MED ORDER — CLOPIDOGREL BISULFATE 75 MG PO TABS: 75.0000 mg | ORAL_TABLET | Freq: Every day | ORAL | 3 refills | Status: DC

## 2018-05-06 NOTE — Telephone Encounter (Signed)
Patient spoke  Patient wanted  Prescription because he would like to shop  Around for less expensive cost -   RN INFORMED  PATIENT will be able to have rx signed next week- due  Dr Gwenlyn Found NOT IN OFFICE , BUT [facility] INFORMED PATIENT OF [facility]  PATIENT STATES HE HAS GOOD RX , BUT HE [place] OFFICE IF HE DECIDES  TO GO THAT ROUTE  , RN INFORMED PATIENT HE WILL RECEIVE A CALL ONCE RX IS SIGNED OTHER WISE

## 2018-05-06 NOTE — Telephone Encounter (Signed)
New Message:     Pt says he would have written prescriptions for his Atorvastatin and Clopidogrel please. Please call when they are ready, he will pick it up.

## 2018-05-07 ENCOUNTER — Ambulatory Visit: Payer: Managed Care, Other (non HMO) | Admitting: Diagnostic Neuroimaging

## 2018-05-07 ENCOUNTER — Telehealth: Payer: Self-pay | Admitting: Cardiovascular Disease

## 2018-05-07 NOTE — Telephone Encounter (Signed)
   Primary Cardiologist: Quay Burow, MD  Chart reviewed as part of pre-operative protocol coverage.   Dr. Gwenlyn Found, can you please comment on whether patient can hold plavix x5 days prior to his upcoming C-spine injection? Patient has a history of CAD with last PCI/DES placed 07/2016.   Please route your response back to P CV DIV PREOP   Thank you!  Abigail Butts, PA-C 05/07/2018, 4:25 PM

## 2018-05-07 NOTE — Telephone Encounter (Signed)
° °  [facility] Pre-operative Risk Assessment    Request for surgical clearance:  1. What type of surgery is being performed? Cervical Epidural   2. When is this surgery scheduled? Pending   3. What type of clearance is required (medical clearance vs. Pharmacy clearance to hold med vs. Both)? Pharmacy  4. Are there any medications that need to be held prior to surgery and how long? Can pt stop his Plavix for 5 days prior to his injection*   5. Practice name and name of physician performing surgery? Dr Suella Broad   6. What is your office phone number [phone]    7.   What is your office fax number [phone]  8.   Anesthesia type (None, local, MAC, general) ?None   Glyn Ade 05/07/2018, 3:19 PM  _________________________________________________________________   (provider comments below)

## 2018-05-11 ENCOUNTER — Telehealth: Payer: Self-pay

## 2018-05-11 NOTE — Telephone Encounter (Signed)
Pt aware that he can pick up paper Rx for atorvastatin and clopidogrel at front desk

## 2018-05-11 NOTE — Telephone Encounter (Signed)
Non-STEMI was almost 2 years ago.  Okay to hold antiplatelet agents for a week prior to his epidural.

## 2018-05-13 NOTE — Telephone Encounter (Signed)
   Primary Cardiologist: Quay Burow, MD  Chart reviewed as part of pre-operative protocol coverage. Per Dr. Gwenlyn Found, okay to hold plavix 5-7 days prior to his upcoming epidural injection. He should restart this medication when cleared to do so by Dr. Nelva Bush.   I will route this recommendation to the requesting party via Epic fax function and remove from pre-op pool.  Please call with questions.  Abigail Butts, PA-C 05/13/2018, 3:01 PM

## 2018-11-23 ENCOUNTER — Other Ambulatory Visit: Payer: Self-pay

## 2018-11-23 DIAGNOSIS — Z20822 Contact with and (suspected) exposure to covid-19: Secondary | ICD-10-CM

## 2018-11-24 LAB — NOVEL CORONAVIRUS, NAA: SARS-CoV-2, NAA: NOT DETECTED

## 2018-12-08 ENCOUNTER — Telehealth: Payer: Self-pay

## 2018-12-08 NOTE — Telephone Encounter (Signed)
Contacted pt PCP Dr. Katrine Coho office at [phone] and left message for medical records requesting that pt most recent lab work be faxed to [facility] at [phone] and advised to call office if additional info needed

## 2018-12-10 ENCOUNTER — Encounter: Payer: Self-pay | Admitting: Cardiovascular Disease

## 2018-12-10 ENCOUNTER — Other Ambulatory Visit: Payer: Self-pay

## 2018-12-10 ENCOUNTER — Ambulatory Visit (INDEPENDENT_AMBULATORY_CARE_PROVIDER_SITE_OTHER): Payer: Managed Care, Other (non HMO) | Admitting: Cardiovascular Disease

## 2018-12-10 VITALS — BP 124/84 | HR 72 | Temp 97.0°F | Ht 69.0 in | Wt 186.0 lb

## 2018-12-10 DIAGNOSIS — E785 Hyperlipidemia, unspecified: Secondary | ICD-10-CM | POA: Diagnosis not present

## 2018-12-10 DIAGNOSIS — I10 Essential (primary) hypertension: Secondary | ICD-10-CM | POA: Diagnosis not present

## 2018-12-10 DIAGNOSIS — G4733 Obstructive sleep apnea (adult) (pediatric): Secondary | ICD-10-CM

## 2018-12-10 DIAGNOSIS — Z951 Presence of aortocoronary bypass graft: Secondary | ICD-10-CM

## 2018-12-10 NOTE — Assessment & Plan Note (Signed)
History of essential hypertension with blood pressure measured today 124/84.  He is on Bystolic.

## 2018-12-10 NOTE — Progress Notes (Signed)
12/10/2018 Luke Smith   Jul 05, 1960  [id number]  Primary Physician Deland Pretty, MD Primary Cardiologist: Lorretta Harp MD [name], [place]  HPI:  Luke Smith is a 58 y.o.  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 I last saw him in the office  10/23/2017. He was referred by Dr. Deland Pretty for cardiovascular evaluation because of an abnormal MET test. His cardiovascular risk profile is positive for remote tobacco abuse having quit 14 years ago and smoked 10 pack years prior to that. There is no family history of heart disease. Never had a heart attack or stroke. He does have obstructive sleep apnea intolerant to CPAP. I have not seen him back for several years. Over the last month he developed new onset substernal chest burning associated with diaphoresis, shortness of breath and upper extremity radiation. He did undergo colonoscopy and endoscopy recently. This chest pain has been nitrate responsive. A Myoview stress test performed 12/07/15 was low risk evidence of ischemia. There was inferior basal thinning probably related to valve plane artifact. I had originally scheduled to perform cardiac catheterization on him several days later however he was admitted urgently with unstable angina and underwent cardiac catheterization on 01/16/16 by Dr. Tamala Julian revealing three-vessel disease with low normal LV function and inferior wall motion abnormality. The following day he underwent coronary artery bypass grafting X 4 by Dr. Cyndia Bent with a LIMA to his LAD, RIMA to the RCA and vein graft to diagonal branch and obtuse marginal branch. His postoperative course was uncomplicated. He did participate in cardiac rehabilitation and had returned back to work when I saw him last. He was admitted on 07/12/16 with a non-STEMI and underwent cardiac catheterization 2 days later by Dr.Varanasi. The vein to diagonal branch and obtuse marginal  branch were occluded, RIMA to the RCA and LIMA to the LAD were patent. Interestingly, his EF was 35-40% by ventriculography and 65% by 2-D echo. He is completely asymptomatic on dual antiplatelet therapy. Dr.Varanasistented his AV groove circumflex with a Promus premiere drug-eluting stent.  Since I saw him a year ago he is remained asymptomatic.  He denies chest pain or shortness of breath.   Current Meds  Medication Sig   Ascorbic Acid (VITAMIN C) 1000 MG tablet Take 1,000 mg by mouth 2 (two) times daily.   aspirin (ASPIRIN LOW DOSE) 81 MG EC tablet Take 1 tablet (81 mg total) by mouth daily.   atorvastatin (LIPITOR) 80 MG tablet Take 1 tablet (80 mg total) by mouth daily.   atorvastatin (LIPITOR) 80 MG tablet Take 1 tablet (80 mg total) by mouth daily.   Cholecalciferol (VITAMIN D3) 5000 units CAPS Take 5,000 Units by mouth daily.   clopidogrel (PLAVIX) 75 MG tablet Take 1 tablet (75 mg total) by mouth daily with breakfast.   cyclobenzaprine (FLEXERIL) 10 MG tablet Take 1 tablet (10 mg total) by mouth 2 (two) times daily as needed for muscle spasms.   HYDROcodone-acetaminophen (NORCO/VICODIN) 5-325 MG tablet Take 1 tablet by mouth every 6 (six) hours as needed (for back pain).    Magnesium 400 MG CAPS Take 1 tablet by mouth daily.   metoprolol tartrate (LOPRESSOR) 25 MG tablet Take 1 tablet (25 mg total) by mouth 2 (two) times daily.   nebivolol (BYSTOLIC) 5 MG tablet Take 5 mg by mouth daily.   nitroGLYCERIN (NITROSTAT) 0.4 MG SL tablet Place 1 tablet (0.4 mg total) under  the tongue every 5 (five) minutes as needed for chest pain.   SILDENAFIL CITRATE PO Take 1-5 tablets by mouth as needed.   zolpidem (AMBIEN) 10 MG tablet Take 10 mg by mouth at bedtime as needed for sleep.     Allergies  Allergen Reactions   Aspirin Other (See Comments)    Acid reflux if he takes more than one     Social History   Socioeconomic History   Marital status: Married    Spouse name:  Not on file   Number of children: 2   Years of education: Not on file   Highest education level: Not on file  Occupational History   Occupation: Chief Financial Officer  Social Needs   Financial resource strain: Not on file   Food insecurity    Worry: Not on file    Inability: Not on file   Transportation needs    Medical: Not on file    Non-medical: Not on file  Tobacco Use   Smoking status: Former Smoker    Packs/day: 0.50    Years: 20.00    Pack years: 10.00    Types: Cigarettes    Quit date: 04/14/2001    Years since quitting: 17.6   Smokeless tobacco: Never Used  Substance and Sexual Activity   Alcohol use: Yes    Alcohol/week: 6.0 standard drinks    Types: 6 Cans of beer per week    Comment: social   Drug use: No   Sexual activity: Not on file  Lifestyle   Physical activity    Days per week: Not on file    Minutes per session: Not on file   Stress: Not on file  Relationships   Social connections    Talks on phone: Not on file    Gets together: Not on file    Attends religious service: Not on file    Active member of club or organization: Not on file    Attends meetings of clubs or organizations: Not on file    Relationship status: Not on file   Intimate partner violence    Fear of current or ex partner: Not on file    Emotionally abused: Not on file    Physically abused: Not on file    Forced sexual activity: Not on file  Other Topics Concern   Not on file  Social History Narrative   Married for 37 years.  Works as a Chief Financial Officer for [employer]: General: negative for chills, fever, night sweats or weight changes.  Cardiovascular: negative for chest pain, dyspnea on exertion, edema, orthopnea, palpitations, paroxysmal nocturnal dyspnea or shortness of breath Dermatological: negative for rash Respiratory: negative for cough or wheezing Urologic: negative for hematuria Abdominal: negative for nausea, vomiting, diarrhea, bright red blood  per rectum, melena, or hematemesis Neurologic: negative for visual changes, syncope, or dizziness All other systems reviewed and are otherwise negative except as noted above.    Blood pressure 124/84, pulse 72, temperature (!) 97 F (36.1 C), height _0  (1.753 m), weight 186 lb (84.4 kg).  General appearance: alert and no distress Neck: no adenopathy, no carotid bruit, no JVD, supple, symmetrical, trachea midline and thyroid not enlarged, symmetric, no tenderness/mass/nodules Lungs: clear to auscultation bilaterally Heart: regular rate and rhythm, S1, S2 normal, no murmur, click, rub or gallop Extremities: extremities normal, atraumatic, no cyanosis or edema Pulses: 2+ and symmetric Skin: Skin color, texture, turgor normal. No rashes or lesions Neurologic: Alert and  oriented X 3, normal strength and tone. Normal symmetric reflexes. Normal coordination and gait  EKG sinus rhythm at 72 with nonspecific ST and T wave changes.  I personally reviewed this EKG.  ASSESSMENT AND PLAN:   Sleep apnea History of obstructive sleep apnea on CPAP  Dyslipidemia History of hyperlipidemia on high-dose statin therapy with lipid profile performed 10/08/2018 revealing total cholesterol 136, LDL 68 and HDL 36.  Hypertension History of essential hypertension with blood pressure measured today 124/84.  He is on Bystolic.  Hx of CABG History of CABG by Dr. Cyndia Bent after a heart cath performed by Dr. Tamala Julian 01/16/2016 revealed three-vessel disease with low normal LV function.  He had a LIMA to the LAD, RIMA to the RCA, vein graft to the diagonal branch and obtuse marginal branch.  His postop course was uncomplicated.  He did have a non-STEMI 07/12/2016 and underwent cardiac catheterization by Dr. Irish Lack revealing occluded vein to an obtuse marginal branch and diagonal branch.  He underwent native AV groove diagonal branch intervention.  His last 2D echo performed 11/10/2016 revealed normal LV systolic function.   He denies chest pain or shortness of breath and remains on dual antiplatelet therapy.      Lorretta Harp MD FACP,FACC,FAHA, [facility] 12/10/2018 3:35 PM

## 2018-12-10 NOTE — Assessment & Plan Note (Signed)
History of obstructive sleep apnea on CPAP. 

## 2018-12-10 NOTE — Assessment & Plan Note (Signed)
History of CABG by Dr. Cyndia Bent after a heart cath performed by Dr. Tamala Julian 01/16/2016 revealed three-vessel disease with low normal LV function.  He had a LIMA to the LAD, RIMA to the RCA, vein graft to the diagonal branch and obtuse marginal branch.  His postop course was uncomplicated.  He did have a non-STEMI 07/12/2016 and underwent cardiac catheterization by Dr. Irish Lack revealing occluded vein to an obtuse marginal branch and diagonal branch.  He underwent native AV groove diagonal branch intervention.  His last 2D echo performed 11/10/2016 revealed normal LV systolic function.  He denies chest pain or shortness of breath and remains on dual antiplatelet therapy.

## 2018-12-10 NOTE — Patient Instructions (Signed)

## 2018-12-10 NOTE — Assessment & Plan Note (Signed)
History of hyperlipidemia on high-dose statin therapy with lipid profile performed 10/08/2018 revealing total cholesterol 136, LDL 68 and HDL 36.

## 2019-03-18 ENCOUNTER — Other Ambulatory Visit: Payer: Self-pay

## 2019-03-18 DIAGNOSIS — Z20822 Contact with and (suspected) exposure to covid-19: Secondary | ICD-10-CM

## 2019-03-20 LAB — NOVEL CORONAVIRUS, NAA: SARS-CoV-2, NAA: NOT DETECTED

## 2019-05-30 ENCOUNTER — Other Ambulatory Visit: Payer: Self-pay | Admitting: Cardiovascular Disease

## 2019-11-29 ENCOUNTER — Encounter: Payer: Self-pay | Admitting: Cardiovascular Disease

## 2019-11-29 ENCOUNTER — Other Ambulatory Visit: Payer: Self-pay

## 2019-11-29 ENCOUNTER — Ambulatory Visit (INDEPENDENT_AMBULATORY_CARE_PROVIDER_SITE_OTHER): Payer: Managed Care, Other (non HMO) | Admitting: Cardiovascular Disease

## 2019-11-29 VITALS — BP 140/92 | HR 75 | Ht 69.0 in | Wt 195.0 lb

## 2019-11-29 DIAGNOSIS — G4733 Obstructive sleep apnea (adult) (pediatric): Secondary | ICD-10-CM | POA: Diagnosis not present

## 2019-11-29 DIAGNOSIS — E785 Hyperlipidemia, unspecified: Secondary | ICD-10-CM

## 2019-11-29 DIAGNOSIS — Z951 Presence of aortocoronary bypass graft: Secondary | ICD-10-CM

## 2019-11-29 DIAGNOSIS — I10 Essential (primary) hypertension: Secondary | ICD-10-CM

## 2019-11-29 NOTE — Patient Instructions (Signed)
Medication Instructions:  Stop Plavix   *If you need a refill on your cardiac medications before your next appointment, please call your pharmacy*   Follow-Up: At [facility], you and your health needs are our priority.  As part of our continuing mission to provide you with exceptional heart care, we have created designated Provider Care Teams.  These Care Teams include your primary Cardiologist (physician) and Advanced Practice Providers (APPs -  Physician Assistants and Nurse Practitioners) who all work together to provide you with the care you need, when you need it.  We recommend signing up for the patient portal called "MyChart".  Sign up information is provided on this After Visit Summary.  MyChart is used to connect with patients for Virtual Visits (Telemedicine).  Patients are able to view lab/test results, encounter notes, upcoming appointments, etc.  Non-urgent messages can be sent to your provider as well.   To learn more about what you can do with MyChart, go to [email or web address].    Your next appointment:   12 month(s)  The format for your next appointment:   In Person  Provider:   Quay Burow, MD

## 2019-11-29 NOTE — Assessment & Plan Note (Signed)
History of dyslipidemia on atorvastatin with lipid profile performed 10/13/2019 revealing total cholesterol of 168, LDL of 90 and HDL 43.  Apparently Dr. Shelia Media has added another lipid-lowering drug and the patient is aware of dietary modifications.

## 2019-11-29 NOTE — Assessment & Plan Note (Signed)
History of obstructive sleep apnea on CPAP. 

## 2019-11-29 NOTE — Assessment & Plan Note (Signed)
Remote  

## 2019-11-29 NOTE — Assessment & Plan Note (Signed)
History of CAD status post catheterization 01/16/2016 by Dr. Tamala Julian revealing three-vessel disease with low normal LV function.  The following day he underwent CABG x4 by Dr. Cyndia Bent the LIMA to his LAD, RIMA to the RCA, vein to the diagonal branch and obtuse marginal branch.  He was recath 07/12/2016 with a non-STEMI by Dr. Irish Lack.  The diagonal branch vein graft was occluded and the other grafts were patent.  His EF at that time was 35 to 40% by ventriculography 65% by 2D echo.  Dr. Irish Lack has also stented his AV groove circumflex.  Is completely asymptomatic on aspirin Plavix.

## 2019-11-29 NOTE — Progress Notes (Signed)
11/29/2019 Luke Smith   Nov 29, 1960  [id number]  Primary Physician Deland Pretty, MD Primary Cardiologist: Lorretta Harp MD [name], [place]  HPI:  Luke Smith is a 59 y.o.   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 I last saw him in the office  12/02/2018. He was referred by Dr. Deland Pretty for cardiovascular evaluation because of an abnormal MET test. His cardiovascular risk profile is positive for remote tobacco abuse having quit 14 years ago and smoked 10 pack years prior to that. There is no family history of heart disease. Never had a heart attack or stroke. He does have obstructive sleep apnea intolerant to CPAP. I have not seen him back for several years. Over the last month he developed new onset substernal chest burning associated with diaphoresis, shortness of breath and upper extremity radiation. He did undergo colonoscopy and endoscopy recently. This chest pain has been nitrate responsive. A Myoview stress test performed 12/07/15 was low risk evidence of ischemia. There was inferior basal thinning probably related to valve plane artifact. I had originally scheduled to perform cardiac catheterization on him several days later however he was admitted urgently with unstable angina and underwent cardiac catheterization on 01/16/16 by Dr. Tamala Julian revealing three-vessel disease with low normal LV function and inferior wall motion abnormality. The following day he underwent coronary artery bypass grafting X 4 by Dr. Cyndia Bent with a LIMA to his LAD, RIMA to the RCA and vein graft to diagonal branch and obtuse marginal branch. His postoperative course was uncomplicated. He did participate in cardiac rehabilitation and had returned back to work when I saw him last. He was admitted on 07/12/16 with a non-STEMI and underwent cardiac catheterization 2 days later by Dr.Varanasi. The vein to diagonal branch and obtuse marginal  branch were occluded, RIMA to the RCA and LIMA to the LAD were patent. Interestingly, his EF was 35-40% by ventriculography and 65% by 2-D echo. He is completely asymptomatic on dual antiplatelet therapy. Dr.Varanasistented his AV groove circumflex with a Promus premiere drug-eluting stent.  Since I saw him a year ago he is remained asymptomatic. He denies chest pain or shortness of breath.  He was recently playing golf with his son and injured his right brachial area with significant ecchymosis probably related to bicipital tendon tear.  He has had an MRI and is scheduled to see an orthopedic surgeon.  I told him that he can probably stop his Plavix.   Current Meds  Medication Sig  . Ascorbic Acid (VITAMIN C) 1000 MG tablet Take 1,000 mg by mouth 2 (two) times daily.  Marland Kitchen aspirin (ASPIRIN LOW DOSE) 81 MG EC tablet Take 1 tablet (81 mg total) by mouth daily.  Marland Kitchen atorvastatin (LIPITOR) 80 MG tablet TAKE ONE TABLET BY MOUTH DAILY  . Cholecalciferol (VITAMIN D3) 5000 units CAPS Take 5,000 Units by mouth daily.  . clopidogrel (PLAVIX) 75 MG tablet TAKE ONE TABLET BY MOUTH DAILY WITH BREAKFAST  . cyclobenzaprine (FLEXERIL) 10 MG tablet Take 1 tablet (10 mg total) by mouth 2 (two) times daily as needed for muscle spasms.  Marland Kitchen HYDROcodone-acetaminophen (NORCO/VICODIN) 5-325 MG tablet Take 1 tablet by mouth every 6 (six) hours as needed (for back pain).   . Magnesium 400 MG CAPS Take 1 tablet by mouth daily.  . nebivolol (BYSTOLIC) 5 MG tablet Take 5 mg by mouth daily.  . nitroGLYCERIN (NITROSTAT) 0.4 MG SL tablet  Place 1 tablet (0.4 mg total) under the tongue every 5 (five) minutes as needed for chest pain.  Marland Kitchen SILDENAFIL CITRATE PO Take 1-5 tablets by mouth as needed.  . zolpidem (AMBIEN) 10 MG tablet Take 10 mg by mouth at bedtime as needed for sleep.     Allergies  Allergen Reactions  . Aspirin Other (See Comments)    Acid reflux if he takes more than one     Social History   Socioeconomic  History  . Marital status: Married    Spouse name: Not on file  . Number of children: 2  . Years of education: Not on file  . Highest education level: Not on file  Occupational History  . Occupation: Chief Financial Officer  Tobacco Use  . Smoking status: Former Smoker    Packs/day: 0.50    Years: 20.00    Pack years: 10.00    Types: Cigarettes    Quit date: 04/14/2001    Years since quitting: 18.6  . Smokeless tobacco: Never Used  Substance and Sexual Activity  . Alcohol use: Yes    Alcohol/week: 6.0 standard drinks    Types: 6 Cans of beer per week    Comment: social  . Drug use: No  . Sexual activity: Not on file  Other Topics Concern  . Not on file  Social History Narrative   Married for 37 years.  Works as a Chief Financial Officer for [employer]:   . Difficulty of Paying Living Expenses:   Food Insecurity:   . Worried About Charity fundraiser in the Last Year:   . Arboriculturist in the Last Year:   Transportation Needs:   . Film/video editor (Medical):   Marland Kitchen Lack of Transportation (Non-Medical):   Physical Activity:   . Days of Exercise per Week:   . Minutes of Exercise per Session:   Stress:   . Feeling of Stress :   Social Connections:   . Frequency of Communication with Friends and Family:   . Frequency of Social Gatherings with Friends and Family:   . Attends Religious Services:   . Active Member of Clubs or Organizations:   . Attends Archivist Meetings:   Marland Kitchen Marital Status:   Intimate Partner Violence:   . Fear of Current or Ex-Partner:   . Emotionally Abused:   Marland Kitchen Physically Abused:   . Sexually Abused:      Review of Systems: General: negative for chills, fever, night sweats or weight changes.  Cardiovascular: negative for chest pain, dyspnea on exertion, edema, orthopnea, palpitations, paroxysmal nocturnal dyspnea or shortness of breath Dermatological: negative for rash Respiratory: negative for cough  or wheezing Urologic: negative for hematuria Abdominal: negative for nausea, vomiting, diarrhea, bright red blood per rectum, melena, or hematemesis Neurologic: negative for visual changes, syncope, or dizziness All other systems reviewed and are otherwise negative except as noted above.    Blood pressure (!) 140/92, pulse 75, height _0  (1.753 m), weight 195 lb (88.5 kg), SpO2 99 %.  General appearance: alert and no distress Neck: no adenopathy, no carotid bruit, no JVD, supple, symmetrical, trachea midline and thyroid not enlarged, symmetric, no tenderness/mass/nodules Lungs: clear to auscultation bilaterally Heart: regular rate and rhythm, S1, S2 normal, no murmur, click, rub or gallop Extremities: extremities normal, atraumatic, no cyanosis or edema Pulses: 2+ and symmetric Skin: Skin color, texture, turgor normal. No rashes or lesions Neurologic: Alert and oriented  X 3, normal strength and tone. Normal symmetric reflexes. Normal coordination and gait  EKG normal sinus rhythm at 75 with nonspecific ST and T wave changes. I  Personally reviewed this EKG.  ASSESSMENT AND PLAN:   Sleep apnea History of obstructive sleep apnea on CPAP  History of smoking Remote  Dyslipidemia History of dyslipidemia on atorvastatin with lipid profile performed 10/13/2019 revealing total cholesterol of 168, LDL of 90 and HDL 43.  Apparently Dr. Shelia Media has added another lipid-lowering drug and the patient is aware of dietary modifications.  Hypertension History of essential hypertension blood pressure measured today 140/92.  This is slightly higher than usual but he is in pain from W bicep rupture.  He is on Bystolic.  Hx of CABG History of CAD status post catheterization 01/16/2016 by Dr. Tamala Julian revealing three-vessel disease with low normal LV function.  The following day he underwent CABG x4 by Dr. Cyndia Bent the LIMA to his LAD, RIMA to the RCA, vein to the diagonal branch and obtuse marginal branch.  He  was recath 07/12/2016 with a non-STEMI by Dr. Irish Lack.  The diagonal branch vein graft was occluded and the other grafts were patent.  His EF at that time was 35 to 40% by ventriculography 65% by 2D echo.  Dr. Irish Lack has also stented his AV groove circumflex.  Is completely asymptomatic on aspirin Plavix.      Lorretta Harp MD FACP,FACC,FAHA, [facility] 11/29/2019 4:03 PM

## 2019-11-29 NOTE — Assessment & Plan Note (Signed)
History of essential hypertension blood pressure measured today 140/92.  This is slightly higher than usual but he is in pain from W bicep rupture.  He is on Bystolic.

## 2020-03-13 ENCOUNTER — Other Ambulatory Visit: Payer: Self-pay | Admitting: Cardiovascular Disease

## 2020-12-05 ENCOUNTER — Other Ambulatory Visit: Payer: Self-pay

## 2020-12-05 ENCOUNTER — Encounter: Payer: Self-pay | Admitting: Cardiovascular Disease

## 2020-12-05 ENCOUNTER — Ambulatory Visit (INDEPENDENT_AMBULATORY_CARE_PROVIDER_SITE_OTHER): Payer: Managed Care, Other (non HMO) | Admitting: Cardiovascular Disease

## 2020-12-05 VITALS — BP 125/60 | HR 67 | Ht 69.0 in | Wt 188.6 lb

## 2020-12-05 DIAGNOSIS — Z951 Presence of aortocoronary bypass graft: Secondary | ICD-10-CM

## 2020-12-05 DIAGNOSIS — I10 Essential (primary) hypertension: Secondary | ICD-10-CM | POA: Diagnosis not present

## 2020-12-05 DIAGNOSIS — E785 Hyperlipidemia, unspecified: Secondary | ICD-10-CM | POA: Diagnosis not present

## 2020-12-05 NOTE — Patient Instructions (Signed)

## 2020-12-05 NOTE — Assessment & Plan Note (Signed)
History of essential hypertension a blood pressure measured at 125/60.  He is on Bystolic.

## 2020-12-05 NOTE — Assessment & Plan Note (Signed)
History of CAD status post cardiac catheterization performed by Dr. Tamala Julian 01/16/2016 revealing three-vessel disease.  The following day he underwent CABG x4 by Dr. Cyndia Bent with a LIMA to his LAD, a RIMA to the RCA, vein to diagonal branch and obtuse marginal branch.  His postop course was uncomplicated.  He was readmitted 07/12/2016 with a non-STEMI and underwent cardiac catheterization by Dr. Irish Lack revealing occluded vein to a diagonal and obtuse marginal branches.  He had PCI and drug-eluting stenting of the native circumflex.  He was on dual antiplatelet therapy after that which she stopped a year ago.  He is asymptomatic.

## 2020-12-05 NOTE — Assessment & Plan Note (Signed)
History of hyperlipidemia on high-dose atorvastatin apparently not at goal and intolerant to Zetia followed by his PCP.  I did mention the possibility of beginning a PCSK9.

## 2020-12-05 NOTE — Progress Notes (Signed)
12/05/2020 Luke Smith   02/12/1961  [id number]  Primary Physician Luke Pretty, MD Primary Cardiologist: Luke Harp MD Luke Smith, Luke Smith  HPI:  Luke Smith is a 60 y.o.  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 I last saw him in the office    11/29/2019. He was referred by Dr. Deland Smith for cardiovascular evaluation because of an abnormal MET test. His cardiovascular risk profile is  positive for remote tobacco abuse having quit 14 years ago and smoked 10 pack years prior to that. There is no family history of heart disease. Never had a heart attack or stroke. He does have obstructive sleep apnea intolerant to CPAP. I have not seen him back for several years. Over the last month he developed new onset substernal chest burning associated with diaphoresis, shortness of breath and upper extremity radiation. He did undergo colonoscopy and endoscopy recently. This chest pain has been nitrate responsive. A Myoview stress test performed 12/07/15 was low risk evidence of ischemia. There was inferior basal thinning probably related to valve plane artifact. I had originally scheduled to perform cardiac catheterization on him several days later however he was admitted urgently with unstable angina and underwent cardiac catheterization on 01/16/16 by Dr. Tamala Smith revealing three-vessel disease with low normal LV function and inferior wall motion abnormality. The following day he underwent coronary artery bypass grafting X 4 by Dr. Cyndia Smith with a LIMA to his LAD, RIMA to the RCA and vein graft to diagonal branch and obtuse marginal branch. His postoperative course was uncomplicated. He did participate in cardiac rehabilitation and had returned back to work when I saw him last. He was admitted on 07/12/16 with a non-STEMI and underwent cardiac catheterization 2 days later by Dr. Irish Smith . The vein to diagonal branch and obtuse marginal  branch were occluded, RIMA to the RCA and LIMA to the LAD were patent. Interestingly, his EF was 35-40% by ventriculography and 65% by 2-D echo. He is completely asymptomatic on dual antiplatelet therapy. Dr. Irish Smith stented his AV groove circumflex with a Promus premiere drug-eluting stent.   Since I saw him a year ago he is remained asymptomatic.  He is very active and walks 2 to 3 miles a day 3 days a week with his wife.  He is totally asymptomatic.  Dr. Pennie Smith is following his lipid profile.  Apparently he is not at goal, was tried on Zetia in addition to high-dose atorvastatin which he was intolerant to.  He may be a candidate for a PCSK9.     Current Meds  Medication Sig   Ascorbic Acid (VITAMIN C) 1000 MG tablet Take 1,000 mg by mouth 2 (two) times daily.   aspirin (ASPIRIN LOW DOSE) 81 MG EC tablet Take 1 tablet (81 mg total) by mouth daily.   atorvastatin (LIPITOR) 80 MG tablet TAKE ONE TABLET BY MOUTH DAILY   Cholecalciferol (VITAMIN D3) 5000 units CAPS Take 5,000 Units by mouth daily.   cyclobenzaprine (FLEXERIL) 10 MG tablet Take 1 tablet (10 mg total) by mouth 2 (two) times daily as needed for muscle spasms.   HYDROcodone-acetaminophen (NORCO/VICODIN) 5-325 MG tablet Take 1 tablet by mouth every 6 (six) hours as needed (for back pain).    nebivolol (BYSTOLIC) 5 MG tablet Take 5 mg by mouth daily.   nitroGLYCERIN (NITROSTAT) 0.4 MG SL tablet Place 1 tablet (0.4 mg total) under the tongue every 5 (five)  minutes as needed for chest pain.   SILDENAFIL CITRATE PO Take 1-5 tablets by mouth as needed.   zolpidem (AMBIEN) 10 MG tablet Take 10 mg by mouth at bedtime as needed for sleep.     Allergies  Allergen Reactions   Aspirin Other (See Comments)    Acid reflux if he takes more than one     Social History   Socioeconomic History   Marital status: Married    Spouse name: Not on file   Number of children: 2   Years of education: Not on file   Highest education level: Not on  file  Occupational History   Occupation: Chief Financial Officer  Tobacco Use   Smoking status: Former    Packs/day: 0.50    Years: 20.00    Pack years: 10.00    Types: Cigarettes    Quit date: 04/14/2001    Years since quitting: 19.6   Smokeless tobacco: Never  Substance and Sexual Activity   Alcohol use: Yes    Alcohol/week: 6.0 standard drinks    Types: 6 Cans of beer per week    Comment: social   Drug use: No   Sexual activity: Not on file  Other Topics Concern   Not on file  Social History Narrative   Married for 37 years.  Works as a Chief Financial Officer for [employer] Strain: Not on [employer] Insecurity: Not on file  Transportation Needs: Not on file  Physical Activity: Not on file  Stress: Not on file  Social Connections: Not on file  Intimate Partner Violence: Not on file     Review of Systems: General: negative for chills, fever, night sweats or weight changes.  Cardiovascular: negative for chest pain, dyspnea on exertion, edema, orthopnea, palpitations, paroxysmal nocturnal dyspnea or shortness of breath Dermatological: negative for rash Respiratory: negative for cough or wheezing Urologic: negative for hematuria Abdominal: negative for nausea, vomiting, diarrhea, bright red blood per rectum, melena, or hematemesis Neurologic: negative for visual changes, syncope, or dizziness All other systems reviewed and are otherwise negative except as noted above.    Blood pressure 125/60, pulse 67, height 5' 9" (1.753 m), weight 188 lb 9.6 oz (85.5 kg), SpO2 99 %.  General appearance: alert and no distress Neck: no adenopathy, no carotid bruit, no JVD, supple, symmetrical, trachea midline, and thyroid not enlarged, symmetric, no tenderness/mass/nodules Lungs: clear to auscultation bilaterally Heart: regular rate and rhythm, S1, S2 normal, no murmur, click, rub or gallop Extremities: extremities normal, atraumatic, no cyanosis or  edema Pulses: 2+ and symmetric Skin: Skin color, texture, turgor normal. No rashes or lesions Neurologic: Grossly normal  EKG sinus rhythm at 67 with nonspecific ST and T wave changes.  I personally reviewed this EKG.  ASSESSMENT AND PLAN:   Dyslipidemia History of hyperlipidemia on high-dose atorvastatin apparently not at goal and intolerant to Zetia followed by his PCP.  I did mention the possibility of beginning a PCSK9.  Hypertension History of essential hypertension a blood pressure measured at 125/60.  He is on Bystolic.  Hx of CABG History of CAD status post cardiac catheterization performed by Dr. Tamala Smith 01/16/2016 revealing three-vessel disease.  The following day he underwent CABG x4 by Dr. Cyndia Smith with a LIMA to his LAD, a RIMA to the RCA, vein to diagonal branch and obtuse marginal branch.  His postop course was uncomplicated.  He was readmitted 07/12/2016 with a non-STEMI and underwent cardiac catheterization by Dr. Irish Smith  revealing occluded vein to a diagonal and obtuse marginal branches.  He had PCI and drug-eluting stenting of the native circumflex.  He was on dual antiplatelet therapy after that which she stopped a year ago.  He is asymptomatic.     Luke Harp MD FACP,FACC,FAHA, [facility] 12/05/2020 9:49 AM

## 2021-03-18 ENCOUNTER — Other Ambulatory Visit: Payer: Self-pay

## 2021-03-18 MED ORDER — ATORVASTATIN CALCIUM 80 MG PO TABS: 80.0000 mg | ORAL_TABLET | Freq: Every day | ORAL | 3 refills | Status: DC

## 2021-05-10 ENCOUNTER — Other Ambulatory Visit: Payer: Self-pay | Admitting: Urology

## 2021-05-13 ENCOUNTER — Encounter (HOSPITAL_COMMUNITY)
Admission: RE | Disposition: A | Discharge status: Home / Self Care | Payer: Self-pay | Source: Ambulatory Visit | Attending: Urology

## 2021-05-13 ENCOUNTER — Encounter (HOSPITAL_COMMUNITY): Payer: Self-pay | Admitting: Urology

## 2021-05-13 ENCOUNTER — Ambulatory Visit (HOSPITAL_COMMUNITY): Payer: Commercial Managed Care - HMO | Admitting: Certified Registered Nurse Anesthetist

## 2021-05-13 ENCOUNTER — Ambulatory Visit (HOSPITAL_COMMUNITY)
Admission: RE | Admit: 2021-05-13 | Discharge: 2021-05-13 | Disposition: A | Discharge status: Home / Self Care | Payer: Commercial Managed Care - HMO | Source: Ambulatory Visit | Attending: Urology | Admitting: Urology

## 2021-05-13 ENCOUNTER — Other Ambulatory Visit: Payer: Self-pay

## 2021-05-13 ENCOUNTER — Ambulatory Visit (HOSPITAL_COMMUNITY): Payer: Commercial Managed Care - HMO

## 2021-05-13 DIAGNOSIS — I5030 Unspecified diastolic (congestive) heart failure: Secondary | ICD-10-CM | POA: Diagnosis not present

## 2021-05-13 DIAGNOSIS — I110 Hypertensive heart disease with heart failure: Secondary | ICD-10-CM | POA: Insufficient documentation

## 2021-05-13 DIAGNOSIS — Z955 Presence of coronary angioplasty implant and graft: Secondary | ICD-10-CM | POA: Insufficient documentation

## 2021-05-13 DIAGNOSIS — Z951 Presence of aortocoronary bypass graft: Secondary | ICD-10-CM | POA: Insufficient documentation

## 2021-05-13 DIAGNOSIS — G4730 Sleep apnea, unspecified: Secondary | ICD-10-CM | POA: Insufficient documentation

## 2021-05-13 DIAGNOSIS — N201 Calculus of ureter: Secondary | ICD-10-CM | POA: Insufficient documentation

## 2021-05-13 DIAGNOSIS — Z87891 Personal history of nicotine dependence: Secondary | ICD-10-CM | POA: Insufficient documentation

## 2021-05-13 DIAGNOSIS — I2510 Atherosclerotic heart disease of native coronary artery without angina pectoris: Secondary | ICD-10-CM | POA: Diagnosis not present

## 2021-05-13 HISTORY — DX: Personal history of urinary calculi: Z87.442

## 2021-05-13 HISTORY — PX: CYSTOSCOPY/URETEROSCOPY/HOLMIUM LASER/STENT PLACEMENT: SHX6546

## 2021-05-13 LAB — BASIC METABOLIC PANEL
Anion gap: 8 (ref 5–15)
BUN: 16 mg/dL (ref 6–20)
CO2: 27 mmol/L (ref 22–32)
Calcium: 9.3 mg/dL (ref 8.9–10.3)
Chloride: 102 mmol/L (ref 98–111)
Creatinine, Ser: 0.92 mg/dL (ref 0.61–1.24)
GFR, Estimated: >60 mL/min (ref >60)
Glucose, Bld: 116 mg/dL — ABNORMAL HIGH (ref 70–99)
Potassium: 3.8 mmol/L (ref 3.5–5.1)
Sodium: 137 mmol/L (ref 135–145)

## 2021-05-13 SURGERY — CYSTOSCOPY/URETEROSCOPY/HOLMIUM LASER/STENT PLACEMENT
Anesthesia: General | Site: Ureter | Laterality: Bilateral

## 2021-05-13 MED ORDER — PROPOFOL 10 MG/ML IV BOLUS
INTRAVENOUS | Status: DC | PRN
  Administered 2021-05-13: 160 mg via INTRAVENOUS

## 2021-05-13 MED ORDER — LIDOCAINE HCL (PF) 2 % IJ SOLN
INTRAMUSCULAR | Status: AC
  Filled 2021-05-13: qty 5

## 2021-05-13 MED ORDER — CEFAZOLIN SODIUM-DEXTROSE 2-4 GM/100ML-% IV SOLN
2.0000 g | Freq: Once | INTRAVENOUS | Status: AC
  Administered 2021-05-13: 2 g via INTRAVENOUS
  Filled 2021-05-13: qty 100

## 2021-05-13 MED ORDER — LIDOCAINE 2% (20 MG/ML) 5 ML SYRINGE
INTRAMUSCULAR | Status: DC | PRN
  Administered 2021-05-13: 80 mg via INTRAVENOUS

## 2021-05-13 MED ORDER — FENTANYL CITRATE PF 50 MCG/ML IJ SOSY: 25.0000 ug | PREFILLED_SYRINGE | INTRAMUSCULAR | Status: DC | PRN

## 2021-05-13 MED ORDER — OXYCODONE HCL 5 MG/5ML PO SOLN: 5.0000 mg | Freq: Once | ORAL | Status: DC | PRN

## 2021-05-13 MED ORDER — ONDANSETRON HCL 4 MG/2ML IJ SOLN
INTRAMUSCULAR | Status: AC
  Filled 2021-05-13: qty 2

## 2021-05-13 MED ORDER — OXYCODONE HCL 5 MG PO TABS: 5.0000 mg | ORAL_TABLET | Freq: Once | ORAL | Status: DC | PRN

## 2021-05-13 MED ORDER — PHENYLEPHRINE 40 MCG/ML (10ML) SYRINGE FOR IV PUSH (FOR BLOOD PRESSURE SUPPORT)
PREFILLED_SYRINGE | INTRAVENOUS | Status: DC | PRN
  Administered 2021-05-13 (×2): 80 ug via INTRAVENOUS

## 2021-05-13 MED ORDER — DEXAMETHASONE SODIUM PHOSPHATE 10 MG/ML IJ SOLN
INTRAMUSCULAR | Status: DC | PRN
  Administered 2021-05-13: 10 mg via INTRAVENOUS

## 2021-05-13 MED ORDER — ONDANSETRON HCL 4 MG/2ML IJ SOLN
4.0000 mg | Freq: Once | INTRAMUSCULAR | Status: AC | PRN
  Administered 2021-05-13: 4 mg via INTRAVENOUS

## 2021-05-13 MED ORDER — MIDAZOLAM HCL 2 MG/2ML IJ SOLN
INTRAMUSCULAR | Status: AC
  Filled 2021-05-13: qty 2

## 2021-05-13 MED ORDER — MIDAZOLAM HCL 5 MG/5ML IJ SOLN
INTRAMUSCULAR | Status: DC | PRN
  Administered 2021-05-13: 2 mg via INTRAVENOUS

## 2021-05-13 MED ORDER — ACETAMINOPHEN 500 MG PO TABS
1000.0000 mg | ORAL_TABLET | Freq: Once | ORAL | Status: AC
  Administered 2021-05-13: 1000 mg via ORAL
  Filled 2021-05-13: qty 2

## 2021-05-13 MED ORDER — PHENAZOPYRIDINE HCL 200 MG PO TABS: 200.0000 mg | ORAL_TABLET | Freq: Three times a day (TID) | ORAL | 0 refills | Status: DC | PRN

## 2021-05-13 MED ORDER — SODIUM CHLORIDE 0.9 % IR SOLN
Status: DC | PRN
  Administered 2021-05-13: 3000 mL via INTRAVESICAL

## 2021-05-13 MED ORDER — FENTANYL CITRATE (PF) 100 MCG/2ML IJ SOLN
INTRAMUSCULAR | Status: DC | PRN
  Administered 2021-05-13 (×4): 25 ug via INTRAVENOUS

## 2021-05-13 MED ORDER — IOHEXOL 300 MG/ML  SOLN
INTRAMUSCULAR | Status: DC | PRN
Start: 2021-05-13 — End: 2021-05-13
  Administered 2021-05-13: 5 mL via URETHRAL

## 2021-05-13 MED ORDER — PROPOFOL 10 MG/ML IV BOLUS
INTRAVENOUS | Status: AC
  Filled 2021-05-13: qty 20

## 2021-05-13 MED ORDER — DEXAMETHASONE SODIUM PHOSPHATE 10 MG/ML IJ SOLN
INTRAMUSCULAR | Status: AC
  Filled 2021-05-13: qty 1

## 2021-05-13 MED ORDER — LACTATED RINGERS IV SOLN: INTRAVENOUS | Status: DC

## 2021-05-13 MED ORDER — FENTANYL CITRATE (PF) 100 MCG/2ML IJ SOLN
INTRAMUSCULAR | Status: AC
  Filled 2021-05-13: qty 2

## 2021-05-13 MED ORDER — CHLORHEXIDINE GLUCONATE 0.12 % MT SOLN
15.0000 mL | Freq: Once | OROMUCOSAL | Status: AC
  Administered 2021-05-13: 15 mL via OROMUCOSAL

## 2021-05-13 SURGICAL SUPPLY — 28 items
BAG COUNTER SPONGE SURGICOUNT (BAG) IMPLANT
BAG SPNG CNTER NS LX DISP (BAG)
BAG URO CATCHER STRL LF (MISCELLANEOUS) ×3 IMPLANT
BASKET ZERO TIP NITINOL 2.4FR (BASKET) ×1 IMPLANT
BSKT STON RTRVL ZERO TP 2.4FR (BASKET) ×1
CATH URETL OPEN END 6FR 70 (CATHETERS) ×1 IMPLANT
CLOTH BEACON ORANGE TIMEOUT ST (SAFETY) ×3 IMPLANT
GLOVE SRG 8 PF TXTR STRL LF DI (GLOVE) IMPLANT
GLOVE SURG ENC TEXT LTX SZ7.5 (GLOVE) ×3 IMPLANT
GLOVE SURG POLYISO LF SZ8 (GLOVE) ×1 IMPLANT
GLOVE SURG UNDER POLY LF SZ8 (GLOVE) ×2
GOWN STRL REUS W/TWL LRG LVL3 (GOWN DISPOSABLE) ×3 IMPLANT
GOWN STRL REUS W/TWL XL LVL3 (GOWN DISPOSABLE) ×1 IMPLANT
GUIDEWIRE STR DUAL SENSOR (WIRE) ×4 IMPLANT
GUIDEWIRE ZIPWRE .038 STRAIGHT (WIRE) IMPLANT
IV NS 1000ML (IV SOLUTION)
IV NS 1000ML BAXH (IV SOLUTION) ×2 IMPLANT
IV NS IRRIG 3000ML ARTHROMATIC (IV SOLUTION) ×1 IMPLANT
KIT TURNOVER KIT A (KITS) ×1 IMPLANT
LASER FIB FLEXIVA PULSE ID 365 (Laser) ×1 IMPLANT
MANIFOLD NEPTUNE II (INSTRUMENTS) ×3 IMPLANT
PACK CYSTO (CUSTOM PROCEDURE TRAY) ×3 IMPLANT
SHEATH NAVIGATOR HD 11/13X36 (SHEATH) IMPLANT
STENT URET 6FRX26 CONTOUR (STENTS) ×1 IMPLANT
TRACTIP FLEXIVA PULS ID 200XHI (Laser) IMPLANT
TRACTIP FLEXIVA PULSE ID 200 (Laser)
TUBING CONNECTING 10 (TUBING) ×3 IMPLANT
TUBING UROLOGY SET (TUBING) ×3 IMPLANT

## 2021-05-13 NOTE — Discharge Instructions (Signed)
You may see some blood in the urine and may have some burning with urination for 48-72 hours. You also may notice that you have to urinate more frequently or urgently after your procedure which is normal.  You should call should you develop an inability urinate, fever > 101, persistent nausea and vomiting that prevents you from eating or drinking to stay hydrated.  If you have a stent, you will likely urinate more frequently and urgently until the stent is removed and you may experience some discomfort/pain in the lower abdomen and flank especially when urinating. You may take pain medication prescribed to you if needed for pain. You may also intermittently have blood in the urine until the stent is removed. .You may remove your stent on Friday morning.  Simply pull the string that is taped to your body and the stent will easily come out.  This may be best done in the shower as some urine may come out with the stent.  Usually you will feel relief once the stent is removed, but occasionally patients can develop pain due to residual swelling of the ureter that may temporarily obstruct the kidney.  This can be managed by taking pain medication and it will typically resolve with time.  Please do not hesitate to call if you have pain that is not controlled with your pain medication or does not improved within 24-48 hours.   

## 2021-05-13 NOTE — H&P (Signed)
Office Visit Report     05/13/2021   --------------------------------------------------------------------------------   Luke Smith  MRN: [id number]  DOB: 22-Oct-1960, 61 year old Male  SSN:    PRIMARY CARE:  Deland Pretty, MD  REFERRING:  Deland Pretty, MD  PROVIDER:  Bjorn Loser, M.D.  TREATING:  Daine Gravel, NP  LOCATION:  [facility] [phone]     --------------------------------------------------------------------------------   CC/HPI: I was consulted to assess the patient for an episode of gross hematuria. He was having a lot of back pain perhaps a bit more in the right about 2 weeks ago. He has a lot of chronic low mid pain. He does take pain medicine not again for and took it for about 2 days. He does take daily aspirin but no blood thinner. He smoked 20 years ago   He voids every 3 hours and gets up once a night. Flow was reasonable to good   Patient had stones seen by my partner years ago   No history of bladder surgery. No neurologic issues. Bowel function normal.   05/13/2021: 61 year old male who presents today for KUB and discussion regarding bilateral nonobstructing ureteral calculi. These were easily visualized on [facility] imaging. He denies interval pain or discomfort. No complaints of gross hematuria. He does have scheduled bilateral ureteroscopy for later this afternoon. Unfortunately, this occurred out of difficult time as he is about to start a job in [facility] [facility]. He is very concerned about this job and being able to start. He is supposed to fly to [place] next week.     ALLERGIES: No Allergies    MEDICATIONS: Aspirin  Flomax 0.4 mg capsule 1 capsule PO Daily  Lipitor  Percocet 5 mg-325 mg tablet 1-2 tablet PO Q 8 H PRN  Bystolic  Hydrocodone     GU PSH: Locm 300-399Mg /Ml Iodine,1Ml - 05/10/2021       PSH Notes: Stent 2018   NON-GU PSH: Coronary Artery Bypass Grafting     GU PMH: Gross hematuria - 05/10/2021, -  05/08/2021 Nocturia - 05/08/2021 Urinary Frequency - 05/08/2021 Renal calculus (Stable), Bilateral - 2018, Bilateral, I'm going to have him return in 6 months for KUB to assess for metabolic stone activity., - 2017      PMH Notes:  07/03/16: History of bilateral renal calculi. [facility]. Last seen in 11/17 after being evaluated in the ED for flank and colic pain.  After a weekend trip to [place], the patient developed lower back pain more specifically on the right lower side with no radiation. He also was experiencing increased urinary frequency and some hesitancy. Denies dysuria or hematuria. Denies kidney stone passage. Symptoms were better last night after taking Vicodin and cyclobenzaprine.   Interval history 09/03/16: A CT scan done in 1/17 revealed bilateral renal calculi. He has returned for follow-up imaging to assess for stone activity.      NON-GU PMH: Hypertension Myocardial Infarction Sleep Apnea    FAMILY HISTORY: 1 Daughter - Other 1 son - Other Cancer - Father Death of family member - Father   SOCIAL HISTORY: Marital Status: Married Preferred Language: English; Ethnicity: Not Hispanic Or Latino; Race: White Current Smoking Status: Patient does not smoke anymore. Has not smoked since 02/12/2001. Smoked for 20 years.  Does not use smokeless tobacco. Does drink.  Drinks 3 caffeinated drinks per day. 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    REVIEW OF SYSTEMS:    GU Review Male:   Patient denies  frequent urination, hard to postpone urination, burning/ pain with urination, get up at night to urinate, leakage of urine, stream starts and stops, trouble starting your stream, have to strain to urinate , erection problems, and penile pain.  Gastrointestinal (Upper):   Patient denies nausea, vomiting, and indigestion/ heartburn.  Gastrointestinal (Lower):   Patient denies diarrhea and constipation.  Constitutional:   Patient denies fever, night sweats, weight loss, and  fatigue.  Musculoskeletal:   Patient denies back pain and joint pain.  Neurological:   Patient denies headaches and dizziness.  Psychologic:   Patient denies depression and anxiety.   VITAL SIGNS: None   MULTI-SYSTEM PHYSICAL EXAMINATION:    Constitutional: Well-nourished. No physical deformities. Normally developed. Good grooming.  Respiratory: No labored breathing, no use of accessory muscles.   Cardiovascular: Normal temperature, normal extremity pulses, no swelling, no varicosities.  Skin: No paleness, no jaundice, no cyanosis. No lesion, no ulcer, no rash.  Neurologic / Psychiatric: Oriented to time, oriented to place, oriented to person. No depression, no anxiety, no agitation.  Gastrointestinal: No mass, no tenderness, no rigidity, non obese abdomen.     Complexity of Data:  Source Of History:  Patient  Records Review:   Previous Doctor Records, Previous Hospital Records, Previous Patient Records  Urine Test Review:   Urinalysis, Urine Culture  X-Ray Review: KUB: Reviewed Films. Reviewed Report. Discussed With Patient.     05/13/21  Urinalysis  Urine Appearance Clear   Urine Color Yellow   Urine Glucose Neg mg/dL  Urine Bilirubin Neg mg/dL  Urine Ketones Neg mg/dL  Urine Specific Gravity 1.010   Urine Blood Neg ery/uL  Urine pH 6.0   Urine Protein Neg mg/dL  Urine Urobilinogen 0.2 mg/dL  Urine Nitrites Neg   Urine Leukocyte Esterase Neg leu/uL   PROCEDURES:         KUB - [id number]  A single view of the abdomen is obtained. Bilateral renal shadows are visualized. Tracing along the expected anatomical course of the bilateral ureters, there are previously noted opacities within the each distal ureter measuring approximately 6 mm each.      Patient confirmed No Neulasta OnPro Device.           Urinalysis Dipstick Dipstick Cont'd  Color: Yellow Bilirubin: Neg mg/dL  Appearance: Clear Ketones: Neg mg/dL  Specific Gravity: 1.010 Blood: Neg ery/uL  pH: 6.0 Protein: Neg  mg/dL  Glucose: Neg mg/dL Urobilinogen: 0.2 mg/dL    Nitrites: Neg    Leukocyte Esterase: Neg leu/uL    ASSESSMENT:      ICD-10 Details  1 GU:   Ureteral calculus - N20.1 Bilateral, Undiagnosed New Problem   PLAN:           Document Letter(s):  Created for Patient: Clinical Summary         Notes:   KUB with bilateral ureteral calculi. Urinalysis is clear. We discussed his upcoming procedure in full detail and all of his questions were answered. I did not draw stat renal function today as this can be done prior to surgery this afternoon. He is concerned about finances as well as upcoming changes regarding his new job. I will make this known to you his surgeon as well as advised that he call his employer and discuss possibly postponing start date. He voiced his understanding.         Next Appointment:      Next Appointment: 05/21/2021 11:30 AM    Appointment Type: [facility] OP Appointment  Location: [facility] [phone]    Provider: Daine Gravel, NP    Reason for Visit: PO 1 WEEK FOR CYSTO AND STENT REMOVAL      * Signed by Daine Gravel, NP on 05/13/21 at 10:27 AM (EST)*

## 2021-05-13 NOTE — Anesthesia Procedure Notes (Signed)
Procedure Name: LMA Insertion Date/Time: 05/13/2021 1:35 PM Performed by: Maxwell Caul, CRNA Pre-anesthesia Checklist: Patient identified, Emergency Drugs available, Suction available and Patient being monitored Patient Re-evaluated:Patient Re-evaluated prior to induction Oxygen Delivery Method: Circle system utilized Preoxygenation: Pre-oxygenation with 100% oxygen Induction Type: IV induction LMA: LMA with gastric port inserted LMA Size: 4.0 Placement Confirmation: positive ETCO2 and breath sounds checked- equal and bilateral Tube secured with: Tape Dental Injury: Teeth and Oropharynx as per pre-operative assessment

## 2021-05-13 NOTE — Op Note (Signed)
Preoperative diagnosis: Bilateral ureteral calculi  Postoperative diagnosis: Bilateral ureteral calculi  Procedure:  Cystoscopy Bilateral ureteroscopy and stone removal Ureteroscopic laser lithotripsy Left ureteral stent placement (6 x 26 with string) Bilateral retrograde pyelography with interpretation  Surgeon: Pryor Curia. M.D.  Anesthesia: General  Complications: None  Intraoperative findings: Bilateral retrograde pyelography demonstrated a filling defects within the distal ureters bilaterally consistent with the patients known calculus without other abnormalities.  EBL: Minimal  Specimens: Bilateral ureteral calculi  Disposition of specimens: [facility] for stone analysis  Indication: Luke Smith is a 61 y.o. year old patient with urolithiasis who was found to have bilateral ureteral stones. After reviewing the management options for treatment, the patient elected to proceed with the above surgical procedure(s). We have discussed the potential benefits and risks of the procedure, side effects of the proposed treatment, the likelihood of the patient achieving the goals of the procedure, and any potential problems that might occur during the procedure or recuperation. Informed consent has been obtained.  Description of procedure:  The patient was taken to the operating room and general anesthesia was induced.  The patient was placed in the dorsal lithotomy position, prepped and draped in the usual sterile fashion, and preoperative antibiotics were administered. A preoperative time-out was performed.   Cystourethroscopy was performed.  The patients urethra was examined and was normal. The bladder was then systematically examined in its entirety. There was no evidence for any bladder tumors, stones, or other mucosal pathology.    Attention then turned to the right ureteral orifice and a ureteral catheter was used to intubate the ureteral orifice.   Omnipaque contrast was injected through the ureteral catheter and a retrograde pyelogram was performed with findings as dictated above.  A 0.38 sensor guidewire was then advanced up the right ureter into the renal pelvis under fluoroscopic guidance. The 6 Fr semirigid ureteroscope was then advanced into the ureter next to the guidewire and the calculus was identified.   The stone was then fragmented with the 365 micron holmium laser fiber on a setting of 0.6J and frequency of 6 Hz.   All stones were then removed from the ureter with a zero tip nitinol basket.  Reinspection of the ureter revealed no remaining visible stones or fragments.  It was felt that a stent was not necessary on this side.  Attention then turned to the left ureteral orifice and a ureteral catheter was used to intubate the ureteral orifice.  Omnipaque contrast was injected through the ureteral catheter and a retrograde pyelogram was performed with findings as dictated above.  A 0.38 sensor guidewire was then advanced up the left ureter into the renal pelvis under fluoroscopic guidance. The 6 Fr semirigid ureteroscope was then advanced into the ureter next to the guidewire and the calculus was identified.   The stone was then fragmented with the 365 micron holmium laser fiber on a setting of 0.6 J and frequency of 6 Hz.   All stones were then removed from the ureter with a zero tip nitinol basket.  Reinspection of the ureter revealed no remaining visible stones or fragments.   The wire was then backloaded through the cystoscope and a ureteral stent was advance over the wire using Seldinger technique.  The stent was positioned appropriately under fluoroscopic and cystoscopic guidance.  The wire was then removed with an adequate stent curl noted in the renal pelvis as well as in the bladder. A string tether was left on this stent.  The bladder was then emptied and the procedure ended.  The patient appeared to tolerate the procedure  well and without complications.  The patient was able to be awakened and transferred to the recovery unit in satisfactory condition.

## 2021-05-13 NOTE — Transfer of Care (Signed)
Immediate Anesthesia Transfer of Care Note  Patient: Luke Smith  Procedure(s) Performed: CYSTOSCOPY/ RETROGRADE/URETEROSCOPY/HOLMIUM LASER/STENT PLACEMENT (Bilateral: Ureter)  Patient Location: PACU  Anesthesia Type:General  Level of Consciousness: drowsy  Airway & Oxygen Therapy: Patient Spontanous Breathing and Patient connected to face mask  Post-op Assessment: Report given to RN and Post -op Vital signs reviewed and stable  Post vital signs: Reviewed and stable  Last Vitals:  Vitals Value Taken Time  BP 141/84 05/13/21 1439  Temp    Pulse 64 05/13/21 1441  Resp 16 05/13/21 1441  SpO2 100 % 05/13/21 1441  Vitals shown include unvalidated device data.  Last Pain:  Vitals:   05/13/21 1211  TempSrc: Oral  PainSc: 3       Patients Stated Pain Goal: 3 (16/10/96 0454)  Complications: No notable events documented.

## 2021-05-13 NOTE — Anesthesia Preprocedure Evaluation (Addendum)
Anesthesia Evaluation  Patient identified by MRN, date of birth, ID band Patient awake    Reviewed: Allergy & Precautions, NPO status , Patient's Chart, lab work & pertinent test results, reviewed documented beta blocker date and time   Airway Mallampati: III  TM Distance: >3 FB Neck ROM: Full    Dental no notable dental hx. (+) Teeth Intact, Dental Advisory Given   Pulmonary sleep apnea and Continuous Positive Airway Pressure Ventilation , former smoker,  Quit smoking 2003, 10 pack year history    Pulmonary exam normal breath sounds clear to auscultation       Cardiovascular hypertension, Pt. on medications and Pt. on home beta blockers + CAD, + Cardiac Stents (2018 DES to LCX), + CABG (CABG 2017) and +CHF (grade 1 diastolic dysfunction)  Normal cardiovascular exam+ Valvular Problems/Murmurs MR  Rhythm:Regular Rate:Normal  Echo 2018: - Left ventricle: The cavity size was normal. Wall thickness was  normal. Systolic function was normal. The estimated ejection  fraction was in the range of 60% to 65%. Doppler parameters are  consistent with abnormal left ventricular relaxation (grade 1  diastolic dysfunction).  - Mitral valve: There was mild regurgitation.   Cath 2018: ? Mid RCA lesion, 99 %stenosed. RIMA to RCA is patent. ? 2nd Diag lesion, 50 %stenosed. SVG to diagonal is occluded. ? Mid LAD lesion, 85 %stenosed. LIMA to LAD is patent. ? Dist LAD lesion, 70 %stenosed beyond LIMA insertion. ? Mid Cx to Dist Cx lesion, 95 %stenosed. A STENT PROMUS PREM MR [id number] drug eluting stent was successfully placed, postdilated to 2.8 mm. ? Post intervention, there is a 0% residual stenosis. ? There is moderate left ventricular systolic dysfunction. ? The left ventricular ejection fraction is 35-45% by visual estimate, with anterolateral hypokinesis. ? LV end diastolic pressure is normal. ? There is no aortic valve stenosis. ? High  bifurcation of SFA and profunda femoral artery.   He will need dual antiplatelet therapy. He does have an intolerance to aspirin due to reflux.  Hopefully this can be treated either with a coated aspirin or H2 blocker.  If he cannot take aspirin, would continue clopidogrel long-term. He'll need aggressive secondary prevention.    Neuro/Psych negative neurological ROS  negative psych ROS   GI/Hepatic Neg liver ROS, GERD  Controlled,  Endo/Other  negative endocrine ROS  Renal/GU Renal diseaseB/L renal calculi  negative genitourinary   Musculoskeletal negative musculoskeletal ROS (+)   Abdominal   Peds  Hematology negative hematology ROS (+)   Anesthesia Other Findings   Reproductive/Obstetrics negative OB ROS                            Anesthesia Physical Anesthesia Plan  ASA: 3  Anesthesia Plan: General   Post-op Pain Management: Tylenol PO (pre-op)   Induction: Intravenous  PONV Risk Score and Plan: 3 and Ondansetron, Dexamethasone, Midazolam and Treatment may vary due to age or medical condition  Airway Management Planned: LMA  Additional Equipment: None  Intra-op Plan:   Post-operative Plan: Extubation in OR  Informed Consent: I have reviewed the patients History and Physical, chart, labs and discussed the procedure including the risks, benefits and alternatives for the proposed anesthesia with the patient or authorized representative who has indicated his/her understanding and acceptance.     Dental advisory given  Plan Discussed with: CRNA  Anesthesia Plan Comments:        Anesthesia Quick Evaluation

## 2021-05-14 ENCOUNTER — Encounter (HOSPITAL_COMMUNITY): Payer: Self-pay | Admitting: Urology

## 2021-05-14 NOTE — Anesthesia Postprocedure Evaluation (Signed)
Anesthesia Post Note  Patient: Luke Smith  Procedure(s) Performed: CYSTOSCOPY/ RETROGRADE/URETEROSCOPY/HOLMIUM LASER/STENT PLACEMENT (Bilateral: Ureter)     Patient location during evaluation: PACU Anesthesia Type: General Level of consciousness: sedated and patient cooperative Pain management: pain level controlled Vital Signs Assessment: post-procedure vital signs reviewed and stable Respiratory status: spontaneous breathing Cardiovascular status: stable Anesthetic complications: no   No notable events documented.  Last Vitals:  Vitals:   05/13/21 1515 05/13/21 1530  BP: (!) 146/93 (!) 151/81  Pulse: 68 75  Resp: 12   Temp: 36.4 C   SpO2: 99% 100%    Last Pain:  Vitals:   05/13/21 1515  TempSrc:   PainSc: 0-No pain                 Nolon Nations

## 2022-03-26 ENCOUNTER — Encounter: Payer: Self-pay | Admitting: Internal Medicine

## 2023-10-17 ENCOUNTER — Other Ambulatory Visit: Payer: Self-pay

## 2023-10-17 ENCOUNTER — Encounter (HOSPITAL_BASED_OUTPATIENT_CLINIC_OR_DEPARTMENT_OTHER): Payer: Self-pay | Admitting: Emergency Medicine

## 2023-10-17 ENCOUNTER — Emergency Department (HOSPITAL_BASED_OUTPATIENT_CLINIC_OR_DEPARTMENT_OTHER)
Admission: EM | Admit: 2023-10-17 | Discharge: 2023-10-17 | Disposition: A | Discharge status: Home / Self Care | Attending: Emergency Medicine | Admitting: Emergency Medicine

## 2023-10-17 ENCOUNTER — Emergency Department (HOSPITAL_BASED_OUTPATIENT_CLINIC_OR_DEPARTMENT_OTHER)

## 2023-10-17 DIAGNOSIS — Z7982 Long term (current) use of aspirin: Secondary | ICD-10-CM | POA: Diagnosis not present

## 2023-10-17 DIAGNOSIS — S0003XA Contusion of scalp, initial encounter: Secondary | ICD-10-CM | POA: Diagnosis not present

## 2023-10-17 DIAGNOSIS — S199XXA Unspecified injury of neck, initial encounter: Secondary | ICD-10-CM | POA: Diagnosis present

## 2023-10-17 DIAGNOSIS — S0990XA Unspecified injury of head, initial encounter: Secondary | ICD-10-CM

## 2023-10-17 DIAGNOSIS — Z951 Presence of aortocoronary bypass graft: Secondary | ICD-10-CM | POA: Diagnosis not present

## 2023-10-17 DIAGNOSIS — S161XXA Strain of muscle, fascia and tendon at neck level, initial encounter: Secondary | ICD-10-CM | POA: Insufficient documentation

## 2023-10-17 DIAGNOSIS — W06XXXA Fall from bed, initial encounter: Secondary | ICD-10-CM | POA: Diagnosis not present

## 2023-10-17 MED ORDER — OXYCODONE-ACETAMINOPHEN 5-325 MG PO TABS: 1.0000 | ORAL_TABLET | Freq: Three times a day (TID) | ORAL | 0 refills | Status: AC | PRN

## 2023-10-17 NOTE — Discharge Instructions (Addendum)
 You were seen in the emerged apartment for injuries after a fall couple days ago CAT scan of your head and neck did not show any acute traumatic injuries that were serious That showed your chronic cervical spine issues For mild to moderate pain take Tylenol  Motrin or your cyclobenzaprine  For severe pain take Percocet as directed Do not drink alcohol or drive while taking Percocet Follow-up with your primary care doctor [place] for reevaluation Return to the emerged part for severe pain or any other concerns

## 2023-10-17 NOTE — ED Triage Notes (Signed)
 Pt reports fell out of bed in a hotel Thursday morning when he was trying to turn off an alarm and get his phone; abrasion noted to RT side posterior head and nose; denies blood thinners; c/o neck pain

## 2023-10-17 NOTE — ED Provider Notes (Signed)
 [place] [facility] Provider Note   CSN: [id number] Arrival date & time: 10/17/23  1107     Patient presents with: Head Injury   Luke Smith is a 63 y.o. adult.  With history of status post CABG 2017 no anticoagulation who presents to the ED after a fall.  Patient fell out of bed in a hotel room in [place] 2 days ago.  Struck his head.  He may have lost consciousness.  Was not evaluated.  Since then has had headaches and neck pain.  No other injuries    Head Injury      Prior to Admission medications   Medication Sig Start Date End Date Taking? Authorizing Provider  oxyCODONE -acetaminophen  (PERCOCET/ROXICET) 5-325 MG tablet Take 1 tablet by mouth every 8 (eight) hours as needed for up to 3 days for severe pain (pain score 7-10). 10/17/23 10/20/23 Yes Pamella Ozell LABOR, DO  Ascorbic Acid (VITAMIN C) 1000 MG tablet Take 1,000 mg by mouth 2 (two) times daily.    [provider]  aspirin  (ASPIRIN  LOW DOSE) 81 MG EC tablet Take 1 tablet (81 mg total) by mouth daily. 07/13/17   Court Dorn PARAS, MD  atorvastatin  (LIPITOR ) 80 MG tablet Take 1 tablet (80 mg total) by mouth daily. 03/18/21 03/18/22  Court Dorn PARAS, MD  Cholecalciferol  (VITAMIN D3) 5000 units CAPS Take 5,000 Units by mouth daily.    [provider]  cyclobenzaprine  (FLEXERIL ) 10 MG tablet Take 1 tablet (10 mg total) by mouth 2 (two) times daily as needed for muscle spasms. 02/05/16   Zackowski, Scott, MD  HYDROcodone-acetaminophen  (NORCO/VICODIN) 5-325 MG tablet Take 1 tablet by mouth every 6 (six) hours as needed (for back pain).     [provider]  Magnesium  400 MG CAPS Take 1 tablet by mouth daily.    [provider]  nebivolol (BYSTOLIC) 5 MG tablet Take 5 mg by mouth daily.    [provider]  nitroGLYCERIN  (NITROSTAT ) 0.4 MG SL tablet Place 1 tablet (0.4 mg total) under the tongue every 5 (five) minutes as needed for chest pain. 11/05/16    Court Dorn PARAS, MD  phenazopyridine  (PYRIDIUM ) 200 MG tablet Take 1 tablet (200 mg total) by mouth 3 (three) times daily as needed for pain. 05/13/21   Renda Glance, MD  SILDENAFIL CITRATE PO Take 1-5 tablets by mouth as needed.    [provider]  tamsulosin (FLOMAX) 0.4 MG CAPS capsule Take 0.4 mg by mouth.    [provider]  zolpidem  (AMBIEN ) 10 MG tablet Take 10 mg by mouth at bedtime as needed for sleep.    [provider]    Allergies: Aspirin     Review of Systems  Updated Vital Signs BP (!) 183/99   Pulse (!) 101   Temp 97.8 F (36.6 C) (Oral)   Resp 20   Ht 5' 9 (1.753 m)   Wt 86.2 kg   SpO2 99%   BMI 28.06 kg/m   Physical Exam Vitals and nursing note reviewed.  HENT:     Head: Normocephalic and atraumatic.     Comments: Hematoma over right parietal scalp Eyes:     Pupils: Pupils are equal, round, and reactive to light.  Neck:     Comments: No midline tenderness Left paraspinal tenderness Cardiovascular:     Rate and Rhythm: Normal rate and regular rhythm.  Pulmonary:     Effort: Pulmonary effort is normal.     Breath  sounds: Normal breath sounds.  Abdominal:     Palpations: Abdomen is soft.     Tenderness: There is no abdominal tenderness.  Musculoskeletal:     Cervical back: Neck supple.     Comments: 5 out of 5 motor strength bilateral upper and lower extremities with sensation tact light touch throughout  Skin:    General: Skin is warm and dry.  Neurological:     Mental Status: He is alert.  Psychiatric:        Mood and Affect: Mood normal.     (all labs ordered are listed, but only abnormal results are displayed) Labs Reviewed - No data to display  EKG: None  Radiology: CT Cervical Spine Wo Contrast Result Date: 10/17/2023 CLINICAL DATA:  63 year old male status post fall from bed. Right posterior head injury. EXAM: CT CERVICAL SPINE WITHOUT CONTRAST TECHNIQUE: Multidetector CT imaging of the cervical spine was  performed without intravenous contrast. Multiplanar CT image reconstructions were also generated. RADIATION DOSE REDUCTION: This exam was performed according to the departmental dose-optimization program which includes automated exposure control, adjustment of the mA and/or kV according to patient size and/or use of iterative reconstruction technique. COMPARISON:  Head CT today. FINDINGS: Alignment: Straightening of cervical lordosis. Cervicothoracic junction alignment is within normal limits. Bilateral posterior element alignment is within normal limits. Skull base and vertebrae: Bone mineralization is within normal limits. Visualized skull base is intact. No atlanto-occipital dissociation. C1 and C2 appear intact and aligned. No acute osseous abnormality identified. Soft tissues and spinal canal: No prevertebral fluid or swelling. No visible canal hematoma. Negative visible noncontrast neck soft tissues. Disc levels: Chronic cervical disc and endplate degeneration at multiple levels. Mild spinal stenosis is possible at C6-C7. Chronic facet arthropathy most pronounced at C4-C5 on the left with evidence of developing facet ankylosis there (series 7, image 45). Upper chest: Negative visible noncontrast thoracic inlet. IMPRESSION: 1. No acute traumatic injury identified in the cervical spine. 2. 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 Mild spinal stenosis suspected at C6-C7. Electronically Signed   By: VEAR Hurst M.D.   On: 10/17/2023 12:36   CT Head Wo Contrast Result Date: 10/17/2023 CLINICAL DATA:  63 year old male status post fall from bed. Right posterior head injury. EXAM: CT HEAD WITHOUT CONTRAST TECHNIQUE: Contiguous axial images were obtained from the base of the skull through the vertex without intravenous contrast. RADIATION DOSE REDUCTION: This exam was performed according to the departmental dose-optimization program which includes automated  exposure control, adjustment of the mA and/or kV according to patient size and/or use of iterative reconstruction technique. COMPARISON:  None Available. FINDINGS: Brain: Cerebral volume is within normal limits for age. No midline shift, ventriculomegaly, mass effect, evidence of mass lesion, intracranial hemorrhage or evidence of cortically based acute infarction. Gray-white matter differentiation is within normal limits throughout the brain. Vascular: No suspicious intracranial vascular hyperdensity. Skull: No fracture identified. Sinuses/Orbits: Fluid levels in the posteroinferior left mastoid air cells. Left tympanic cavity appears clear. Less pronounced fluid in contralateral right posteroinferior mastoid air cells, right tympanic cavity clear. Paranasal sinuses appear clear. Other: Right parietal posterior convexity scalp hematoma on series 6, image 32. Underlying calvarium intact. Visualized orbit soft tissues are within normal limits. IMPRESSION: 1. Right parietal posterior convexity scalp hematoma. No skull fracture. 2. Normal for age noncontrast CT appearance of the brain. 3. Bilateral mastoid fluid, appears to be postinflammatory. Electronically Signed   By: VEAR Hurst HERO.D.  On: 10/17/2023 12:34     Procedures   Medications Ordered in the ED - No data to display  Clinical Course as of 10/17/23 1326  Sat Oct 17, 2023  1324 No acute traumatic findings.  Chronic findings in C-spine as above.  Will discharge with short course of Percocet instruction for PCP follow-up [MP]    Clinical Course User Index [MP] Pamella Ozell LABOR, DO                                 Medical Decision Making 63 year old male presenting after fall out of bed 2 days ago.  No anticoagulation.  Obvious head trauma.  Question LOC.  Now with headache neck pain.  No midline tenderness of cervical spine but does have some paraspinal tenderness.  Most likely cervical sprain strain.  Will obtain CT head C-spine to evaluate for  traumatic injury  Amount and/or Complexity of Data Reviewed Radiology: ordered.        Final diagnoses:  Injury of head, initial encounter  Strain of neck muscle, initial encounter    ED Discharge Orders          Ordered    oxyCODONE -acetaminophen  (PERCOCET/ROXICET) 5-325 MG tablet  Every 8 hours PRN        10/17/23 1325               Pamella Ozell LABOR, DO 10/17/23 1326

## 2023-12-26 ENCOUNTER — Other Ambulatory Visit: Payer: Self-pay

## 2023-12-26 ENCOUNTER — Observation Stay (HOSPITAL_BASED_OUTPATIENT_CLINIC_OR_DEPARTMENT_OTHER)
Admission: EM | Admit: 2023-12-26 | Discharge: 2023-12-27 | Disposition: A | Discharge status: Home / Self Care | Attending: Internal Medicine | Admitting: Internal Medicine

## 2023-12-26 ENCOUNTER — Encounter (HOSPITAL_BASED_OUTPATIENT_CLINIC_OR_DEPARTMENT_OTHER): Payer: Self-pay

## 2023-12-26 DIAGNOSIS — K921 Melena: Secondary | ICD-10-CM | POA: Diagnosis not present

## 2023-12-26 DIAGNOSIS — I10 Essential (primary) hypertension: Secondary | ICD-10-CM | POA: Diagnosis not present

## 2023-12-26 DIAGNOSIS — Z7982 Long term (current) use of aspirin: Secondary | ICD-10-CM | POA: Insufficient documentation

## 2023-12-26 DIAGNOSIS — F10920 Alcohol use, unspecified with intoxication, uncomplicated: Secondary | ICD-10-CM | POA: Insufficient documentation

## 2023-12-26 DIAGNOSIS — N400 Enlarged prostate without lower urinary tract symptoms: Secondary | ICD-10-CM | POA: Diagnosis not present

## 2023-12-26 DIAGNOSIS — R58 Hemorrhage, not elsewhere classified: Secondary | ICD-10-CM | POA: Diagnosis not present

## 2023-12-26 DIAGNOSIS — K922 Gastrointestinal hemorrhage, unspecified: Secondary | ICD-10-CM | POA: Diagnosis not present

## 2023-12-26 DIAGNOSIS — K625 Hemorrhage of anus and rectum: Secondary | ICD-10-CM | POA: Diagnosis present

## 2023-12-26 DIAGNOSIS — Z743 Need for continuous supervision: Secondary | ICD-10-CM | POA: Diagnosis not present

## 2023-12-26 DIAGNOSIS — Z79899 Other long term (current) drug therapy: Secondary | ICD-10-CM | POA: Diagnosis not present

## 2023-12-26 LAB — CBC WITH DIFFERENTIAL/PLATELET
Abs Immature Granulocytes: 0.02 K/uL (ref 0.00–0.07)
Basophils Absolute: 0 K/uL (ref 0.0–0.1)
Basophils Relative: 1 %
Eosinophils Absolute: 0.1 K/uL (ref 0.0–0.5)
Eosinophils Relative: 1 %
HCT: 38.1 % — ABNORMAL LOW (ref 39.0–52.0)
Hemoglobin: 13.2 g/dL (ref 13.0–17.0)
Immature Granulocytes: 0 %
Lymphocytes Relative: 20 %
Lymphs Abs: 1.5 K/uL (ref 0.7–4.0)
MCH: 31.2 pg (ref 26.0–34.0)
MCHC: 34.6 g/dL (ref 30.0–36.0)
MCV: 90.1 fL (ref 80.0–100.0)
Monocytes Absolute: 0.5 K/uL (ref 0.1–1.0)
Monocytes Relative: 6 %
Neutro Abs: 5.5 K/uL (ref 1.7–7.7)
Neutrophils Relative %: 72 %
Platelets: 161 K/uL (ref 150–400)
RBC: 4.23 MIL/uL (ref 4.22–5.81)
RDW: 12.3 % (ref 11.5–15.5)
WBC: 7.7 K/uL (ref 4.0–10.5)
nRBC: 0 % (ref 0.0–0.2)

## 2023-12-26 LAB — PROTIME-INR
INR: 1 (ref 0.8–1.2)
Prothrombin Time: 13.5 s (ref 11.4–15.2)

## 2023-12-26 LAB — COMPREHENSIVE METABOLIC PANEL WITH GFR
ALT: 40 U/L (ref 0–44)
AST: 27 U/L (ref 15–41)
Albumin: 4.4 g/dL (ref 3.5–5.0)
Alkaline Phosphatase: 63 U/L (ref 38–126)
Anion gap: 14 (ref 5–15)
BUN: 20 mg/dL (ref 8–23)
CO2: 21 mmol/L — ABNORMAL LOW (ref 22–32)
Calcium: 9 mg/dL (ref 8.9–10.3)
Chloride: 105 mmol/L (ref 98–111)
Creatinine, Ser: 0.92 mg/dL (ref 0.61–1.24)
GFR, Estimated: >60 mL/min (ref >60)
Glucose, Bld: 165 mg/dL — ABNORMAL HIGH (ref 70–99)
Potassium: 4.1 mmol/L (ref 3.5–5.1)
Sodium: 140 mmol/L (ref 135–145)
Total Bilirubin: 0.2 mg/dL (ref 0.0–1.2)
Total Protein: 6.6 g/dL (ref 6.5–8.1)

## 2023-12-26 LAB — HEMOGLOBIN AND HEMATOCRIT, BLOOD
HCT: 39 % (ref 39.0–52.0)
HCT: 36.9 % — ABNORMAL LOW (ref 39.0–52.0)
Hemoglobin: 12.9 g/dL — ABNORMAL LOW (ref 13.0–17.0)
Hemoglobin: 11.6 g/dL — ABNORMAL LOW (ref 13.0–17.0)

## 2023-12-26 LAB — OCCULT BLOOD X 1 CARD TO LAB, STOOL: Fecal Occult Bld: POSITIVE — AB

## 2023-12-26 MED ORDER — ONDANSETRON HCL 4 MG/2ML IJ SOLN: 4.0000 mg | Freq: Four times a day (QID) | INTRAMUSCULAR | Status: DC | PRN

## 2023-12-26 MED ORDER — ORAL CARE MOUTH RINSE: 15.0000 mL | OROMUCOSAL | Status: DC | PRN

## 2023-12-26 MED ORDER — ACETAMINOPHEN 650 MG RE SUPP: 650.0000 mg | Freq: Four times a day (QID) | RECTAL | Status: DC | PRN

## 2023-12-26 MED ORDER — HYDRALAZINE HCL 20 MG/ML IJ SOLN: 5.0000 mg | Freq: Four times a day (QID) | INTRAMUSCULAR | Status: DC | PRN

## 2023-12-26 MED ORDER — AMLODIPINE BESYLATE 5 MG PO TABS
5.0000 mg | ORAL_TABLET | Freq: Every day | ORAL | Status: DC
  Administered 2023-12-26 – 2023-12-27 (×2): 5 mg via ORAL
  Filled 2023-12-26 (×2): qty 1

## 2023-12-26 MED ORDER — ONDANSETRON HCL 4 MG PO TABS: 4.0000 mg | ORAL_TABLET | Freq: Four times a day (QID) | ORAL | Status: DC | PRN

## 2023-12-26 MED ORDER — ACETAMINOPHEN 325 MG PO TABS: 650.0000 mg | ORAL_TABLET | Freq: Four times a day (QID) | ORAL | Status: DC | PRN

## 2023-12-26 MED ORDER — ALBUTEROL SULFATE (2.5 MG/3ML) 0.083% IN NEBU: 2.5000 mg | INHALATION_SOLUTION | RESPIRATORY_TRACT | Status: DC | PRN

## 2023-12-26 MED ORDER — ZOLPIDEM TARTRATE 5 MG PO TABS: 5.0000 mg | ORAL_TABLET | Freq: Every evening | ORAL | Status: DC | PRN

## 2023-12-26 NOTE — ED Provider Notes (Signed)
 [place] [facility] Provider Note   CSN: [id number] Arrival date & time: 12/26/23  9287     Patient presents with: Rectal Bleeding   Luke Smith is a 63 y.o. adult.   HPI     63yo male with history of hypertension, nephrolithiasis, chronic pain, CAD with history of CABG, who presents with concern for rectal bleeding.  Early this morning had some abdominal cramping like he needed to have a bowel movement, and when he went to the bathroom instead of having a bowel movement he had blood.  Reports that it was bright red blood sort of free-flowing into the toilet bowl.  He had 2 other episodes like this where he felt he needed to have a bowel movement but then just had blood.  He has never had bleeding like this before.  He does not remember if he was diagnosed with diverticulosis.  He previously had colonoscopies with [facility] gastroenterology.  He used to take aspirin , but is no longer on any aspirin  or anticoagulation.  He denies any abdominal pain, rectal pain, history of constipation, nausea, vomiting, fever.  He did go to dinner last night.  Was recently started on a steroid by his spine doctor, but given nasal spray.  After the third episode of bleeding he is feeling lightheaded.  Past Medical History:  Diagnosis Date   Coronary artery disease    a.01/17/16: 4V CABG (LIMA to LAD, RIMA to RCA, SVG to D1, SVG to OM)   b. 07/2016: 2/4 patent bypass grafts. SVG--> OM down, s/p DES to OM, SVG--> D2 down but 50% occl D2 with good native flow   GERD (gastroesophageal reflux disease)    History of kidney stones    Hypertension    Kidney stone    Perforated ear drum    left ear   Sleep apnea     Past Surgical History:  Procedure Laterality Date   CARDIAC CATHETERIZATION N/A 01/16/2016   Procedure: Left Heart Cath and Coronary Angiography;  Surgeon: Victory LELON Sharps, MD;  Location: [facility] INVASIVE CV LAB;  Service: Cardiovascular;  Laterality: N/A;    CORONARY ARTERY BYPASS GRAFT N/A 01/17/2016   Procedure: CORONARY ARTERY BYPASS GRAFTING (CABG) x 4 (LIMA to LAD, SVG to DIAGONAL, SVG to OM, and RIMA to RCA) with EVH from RIGHT THIGH GREATER SAPHENOUS VEIN and BILATERAL MAMMARY HARVEST;  Surgeon: Dorise MARLA Fellers, MD;  Location: [facility] OR;  Service: Open Heart Surgery;  Laterality: N/A;   CORONARY STENT INTERVENTION N/A 07/14/2016   Procedure: Coronary Stent Intervention;  Surgeon: Candyce GORMAN Reek, MD;  Location: [facility] INVASIVE CV LAB;  Service: Cardiovascular;  Laterality: N/A;   CYSTOSCOPY/URETEROSCOPY/HOLMIUM LASER/STENT PLACEMENT Bilateral 05/13/2021   Procedure: CYSTOSCOPY/ RETROGRADE/URETEROSCOPY/HOLMIUM LASER/STENT PLACEMENT;  Surgeon: Renda Glance, MD;  Location: [facility] ORS;  Service: Urology;  Laterality: Bilateral;   DENTAL SURGERY  childhood   LEFT HEART CATH AND CORS/GRAFTS ANGIOGRAPHY N/A 07/14/2016   Procedure: Left Heart Cath and Cors/Grafts Angiography;  Surgeon: Candyce GORMAN Reek, MD;  Location: [facility] INVASIVE CV LAB;  Service: Cardiovascular;  Laterality: N/A;   NASAL SEPTUM SURGERY     TEE WITHOUT CARDIOVERSION N/A 01/17/2016   Procedure: TRANSESOPHAGEAL ECHOCARDIOGRAM (TEE);  Surgeon: Dorise MARLA Fellers, MD;  Location: [facility] OR;  Service: Open Heart Surgery;  Laterality: N/A;    Prior to Admission medications   Medication Sig Start Date End Date Taking? Authorizing Provider  Ascorbic Acid (VITAMIN C) 1000 MG tablet Take 1,000 mg by mouth  2 (two) times daily.    [provider]  aspirin  (ASPIRIN  LOW DOSE) 81 MG EC tablet Take 1 tablet (81 mg total) by mouth daily. 07/13/17   Court Dorn PARAS, MD  atorvastatin  (LIPITOR ) 80 MG tablet Take 1 tablet (80 mg total) by mouth daily. 03/18/21 03/18/22  Court Dorn PARAS, MD  Cholecalciferol  (VITAMIN D3) 5000 units CAPS Take 5,000 Units by mouth daily.    [provider]  cyclobenzaprine  (FLEXERIL ) 10 MG tablet Take 1 tablet (10 mg total) by mouth 2 (two) times daily as needed for muscle  spasms. 02/05/16   Zackowski, Scott, MD  HYDROcodone-acetaminophen  (NORCO/VICODIN) 5-325 MG tablet Take 1 tablet by mouth every 6 (six) hours as needed (for back pain).     [provider]  Magnesium  400 MG CAPS Take 1 tablet by mouth daily.    [provider]  nebivolol (BYSTOLIC) 5 MG tablet Take 5 mg by mouth daily.    [provider]  nitroGLYCERIN  (NITROSTAT ) 0.4 MG SL tablet Place 1 tablet (0.4 mg total) under the tongue every 5 (five) minutes as needed for chest pain. 11/05/16   Court Dorn PARAS, MD  phenazopyridine  (PYRIDIUM ) 200 MG tablet Take 1 tablet (200 mg total) by mouth 3 (three) times daily as needed for pain. 05/13/21   Renda Glance, MD  SILDENAFIL CITRATE PO Take 1-5 tablets by mouth as needed.    [provider]  tamsulosin (FLOMAX) 0.4 MG CAPS capsule Take 0.4 mg by mouth.    [provider]  zolpidem  (AMBIEN ) 10 MG tablet Take 10 mg by mouth at bedtime as needed for sleep.    [provider]    Allergies: Aspirin     Review of Systems  Updated Vital Signs BP (!) 157/93 (BP Location: Right Arm)   Pulse 98   Temp 98.2 F (36.8 C) (Oral)   Resp 18   SpO2 99%   Physical Exam Vitals and nursing note reviewed.  Constitutional:      General: He is not in acute distress.    Appearance: He is well-developed. He is not diaphoretic.  HENT:     Head: Normocephalic and atraumatic.  Eyes:     Conjunctiva/sclera: Conjunctivae normal.  Cardiovascular:     Rate and Rhythm: Normal rate and regular rhythm.     Heart sounds: Normal heart sounds. No murmur heard.    No friction rub. No gallop.  Pulmonary:     Effort: Pulmonary effort is normal. No respiratory distress.     Breath sounds: Normal breath sounds. No wheezing or rales.  Abdominal:     General: There is no distension.     Palpations: Abdomen is soft.     Tenderness: There is no abdominal tenderness. There is no guarding.  Genitourinary:    Rectum: Normal.  Guaiac result positive.     Comments: Gross blood on exam No external hemorrhoids Musculoskeletal:     Cervical back: Normal range of motion.  Skin:    General: Skin is warm and dry.  Neurological:     Mental Status: He is alert and oriented to person, place, and time.     (all labs ordered are listed, but only abnormal results are displayed) Labs Reviewed  CBC WITH DIFFERENTIAL/PLATELET - Abnormal; Notable for the following components:      Result Value   HCT 38.1 (*)    All other components within normal limits  COMPREHENSIVE METABOLIC PANEL WITH GFR - Abnormal; Notable for the following components:  CO2 21 (*)    Glucose, Bld 165 (*)    All other components within normal limits  OCCULT BLOOD X 1 CARD TO LAB, STOOL - Abnormal; Notable for the following components:   Fecal Occult Bld POSITIVE (*)    All other components within normal limits  PROTIME-INR    EKG: EKG Interpretation Date/Time:  Saturday December 26 2023 07:50:25 EDT Ventricular Rate:  89 PR Interval:  158 QRS Duration:  98 QT Interval:  401 QTC Calculation: 488 R Axis:   77  Text Interpretation: Sinus rhythm Probable left atrial enlargement Borderline repolarization abnormality Borderline prolonged QT interval No significant change since last tracing Confirmed by Dreama Longs ([id number]) on 12/26/2023 7:52:03 AM  Radiology: No results found.   Procedures   Medications Ordered in the ED - No data to display                                    63yo male with history of hypertension, nephrolithiasis, chronic pain, CAD with history of CABG, who presents with concern for rectal bleeding.  Differential diagnosis includes lower rectal bleeding from colitis, diverticulosis, AVM, internal hemorrhoids.  He is not having abdominal pain nor diarrhea to suggest colitis.  Does not have external hemorrhoids on exam to suggest internal hemorrhoids, although they are still in the differential.  History most  concerning for lower GI bleed such as a diverticular bleed.  Reviewed past CTs which do show evidence of diverticulosis.  He is not on anticoagulation.  EKG obtained given sensation of lightheadedness shows no acute abnormalities.  Labs completed and personally by and interpreted by me show normal INR, normal hemoglobin, no WBC.  Electrolytes WNL.  Plan on admission to the hospital for continued monitoring in the setting of possible diverticular bleed.  Notified gastroenterology [place] Dr. Shila who will consult.        Final diagnoses:  Lower GI bleed    ED Discharge Orders     None          Dreama Longs, MD 12/26/23 [phone]

## 2023-12-26 NOTE — Consult Note (Addendum)
 Consultation  Referring Provider: ER MD/[facility] [place] Primary Care Physician:  Clarice Nottingham, MD Primary Gastroenterologist:  Dr. Shila  Reason for Consultation: Acute GI bleed  HPI: Luke Smith is a 63 y.o. male, with history of obstructive sleep apnea, ureterolithiasis, hypertension, GERD and coronary artery disease status post CABG.o  Patient presented to the emergency room early this morning after he was awakened about 430 this morning with crampy abdominal pain and urge for bowel movement.  He denies any significant abdominal pain just cramps/urge for bowel movement.  He went to the bathroom and says he passed a lot of dark red blood.  There was no black stool or tarry stool.  He had about 4 episodes over about a 1 hour.  Then presented to the ER. Labs showed hemoglobin 13.2/hematocrit 38.1/MCV of 90/platelets 161 and WBC of 7.4 unsure what his baseline hemoglobin is as no labs in epic over the past couple of years. Pro time 13.5/INR 1.0 Potassium 4.1/BUN 20/creatinine 0.92/LFTs within normal limits.  No abdominal imaging. Patient has been hemodynamically stable since arrival, was transferred to [facility] this morning.  He has not had any further episodes of abdominal cramping or bleeding since arrival to the hospital.  Repeat lab earlier this afternoon with hemoglobin 12.9/hematocrit 39.0.  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 Esophageal B biopsy did not show any intestinal metaplasia to suggest Barrett's Colon biopsies/tubular adenomas.  He is overdue for follow-up  Patient says the only thing different that he has done over the past couple of weeks was to start a steroid  which was given for possible carpal tunnel.  On further review this is actually meloxicam.  He says he is not sure that it is helping his wrist much. He is not taking any aspirin  or other NSAIDs, no blood thinners  No prior history of GI bleeding.   Past Medical History:  Diagnosis Date   Coronary artery disease    a.01/17/16: 4V CABG (LIMA to LAD, RIMA to RCA, SVG to D1, SVG to OM)   b. 07/2016: 2/4 patent bypass grafts. SVG--> OM down, s/p DES to OM, SVG--> D2 down but 50% occl D2 with good native flow   GERD (gastroesophageal reflux disease)    History of kidney stones    Hypertension    Kidney stone    Perforated ear drum    left ear   Sleep apnea     Past Surgical History:  Procedure Laterality Date   CARDIAC CATHETERIZATION N/A 01/16/2016   Procedure: Left Heart Cath and Coronary Angiography;  Surgeon: Victory LELON Sharps, MD;  Location: [facility] INVASIVE CV LAB;  Service: Cardiovascular;  Laterality: N/A;   CORONARY ARTERY BYPASS GRAFT N/A 01/17/2016   Procedure: CORONARY ARTERY BYPASS GRAFTING (CABG) x 4 (LIMA to LAD, SVG to DIAGONAL, SVG to OM, and RIMA to RCA) with EVH from RIGHT THIGH GREATER SAPHENOUS VEIN and BILATERAL MAMMARY HARVEST;  Surgeon: Dorise MARLA Fellers, MD;  Location: [facility] OR;  Service: Open Heart Surgery;  Laterality: N/A;   CORONARY STENT INTERVENTION N/A 07/14/2016   Procedure: Coronary Stent Intervention;  Surgeon: Candyce GORMAN Reek, MD;  Location: [facility] INVASIVE CV LAB;  Service: Cardiovascular;  Laterality: N/A;   CYSTOSCOPY/URETEROSCOPY/HOLMIUM LASER/STENT PLACEMENT Bilateral 05/13/2021  Procedure: CYSTOSCOPY/ RETROGRADE/URETEROSCOPY/HOLMIUM LASER/STENT PLACEMENT;  Surgeon: Renda Glance, MD;  Location: [facility] ORS;  Service: Urology;  Laterality: Bilateral;   DENTAL SURGERY  childhood   LEFT HEART CATH AND CORS/GRAFTS ANGIOGRAPHY N/A 07/14/2016   Procedure: Left Heart Cath and Cors/Grafts Angiography;  Surgeon: Candyce GORMAN Reek, MD;  Location: [facility] INVASIVE CV LAB;  Service:  Cardiovascular;  Laterality: N/A;   NASAL SEPTUM SURGERY     TEE WITHOUT CARDIOVERSION N/A 01/17/2016   Procedure: TRANSESOPHAGEAL ECHOCARDIOGRAM (TEE);  Surgeon: Dorise MARLA Fellers, MD;  Location: [facility] OR;  Service: Open Heart Surgery;  Laterality: N/A;    Prior to Admission medications   Medication Sig Start Date End Date Taking? Authorizing Provider  Ascorbic Acid (VITAMIN C) 1000 MG tablet Take 1,000 mg by mouth 2 (two) times daily.    [provider]  aspirin  (ASPIRIN  LOW DOSE) 81 MG EC tablet Take 1 tablet (81 mg total) by mouth daily. 07/13/17   Court Dorn PARAS, MD  atorvastatin  (LIPITOR ) 80 MG tablet Take 1 tablet (80 mg total) by mouth daily. 03/18/21 03/18/22  Court Dorn PARAS, MD  Cholecalciferol  (VITAMIN D3) 5000 units CAPS Take 5,000 Units by mouth daily.    [provider]  cyclobenzaprine  (FLEXERIL ) 10 MG tablet Take 1 tablet (10 mg total) by mouth 2 (two) times daily as needed for muscle spasms. 02/05/16   Zackowski, Scott, MD  HYDROcodone-acetaminophen  (NORCO/VICODIN) 5-325 MG tablet Take 1 tablet by mouth every 6 (six) hours as needed (for back pain).     [provider]  Magnesium  400 MG CAPS Take 1 tablet by mouth daily.    [provider]  nebivolol (BYSTOLIC) 5 MG tablet Take 5 mg by mouth daily.    [provider]  nitroGLYCERIN  (NITROSTAT ) 0.4 MG SL tablet Place 1 tablet (0.4 mg total) under the tongue every 5 (five) minutes as needed for chest pain. 11/05/16   Court Dorn PARAS, MD  phenazopyridine  (PYRIDIUM ) 200 MG tablet Take 1 tablet (200 mg total) by mouth 3 (three) times daily as needed for pain. 05/13/21   Renda Glance, MD  SILDENAFIL CITRATE PO Take 1-5 tablets by mouth as needed.    [provider]  tamsulosin (FLOMAX) 0.4 MG CAPS capsule Take 0.4 mg by mouth.    [provider]  zolpidem  (AMBIEN ) 10 MG tablet Take 10 mg by mouth at bedtime as needed for sleep.    [provider]    Current  Facility-Administered Medications  Medication Dose Route Frequency Provider Last Rate Last Admin   acetaminophen  (TYLENOL ) tablet 650 mg  650 mg Oral Q6H PRN Zella, Mir M, MD       Or   acetaminophen  (TYLENOL ) suppository 650 mg  650 mg Rectal Q6H PRN Zella, Mir M, MD       albuterol  (PROVENTIL ) (2.5 MG/3ML) 0.083% nebulizer solution 2.5 mg  2.5 mg Nebulization Q2H PRN Zella, Mir M, MD       amLODipine  (NORVASC ) tablet 5 mg  5 mg Oral Daily Zella, Mir M, MD       hydrALAZINE  (APRESOLINE ) injection 5 mg  5 mg Intravenous Q6H PRN Zella, Mir M, MD       ondansetron  (ZOFRAN ) tablet 4 mg  4 mg Oral Q6H PRN Zella, Mir M, MD       Or   ondansetron  (ZOFRAN ) injection 4 mg  4 mg Intravenous Q6H PRN Zella Katha HERO, MD       Oral care mouth rinse  15 mL  Mouth Rinse PRN Zella, Mir M, MD       zolpidem  (AMBIEN ) tablet 5 mg  5 mg Oral QHS PRN Zella Katha HERO, MD        Allergies as of 12/26/2023 - Review Complete 12/26/2023  Allergen Reaction Noted   Aspirin  Other (See Comments)     Family History  Problem Relation Age of Onset   Bone cancer Father    Colon cancer Neg Hx     Social History   Socioeconomic History   Marital status: Married    Spouse name: Not on file   Number of children: 2   Years of education: Not on file   Highest education level: Not on file  Occupational History   Occupation: Art gallery manager  Tobacco Use   Smoking status: Former    Current packs/day: 0.00    Average packs/day: 0.5 packs/day for 20.0 years (10.0 ttl pk-yrs)    Types: Cigarettes    Start date: 04/14/1981    Quit date: 04/14/2001    Years since quitting: 22.7   Smokeless tobacco: Never  Substance and Sexual Activity   Alcohol use: Yes    Alcohol/week: 6.0 standard drinks of alcohol    Types: 6 Cans of beer per week    Comment: social   Drug use: No   Sexual activity: Not on file  Other Topics Concern   Not on file  Social History Narrative   Married for 37 years.   Works as a Art gallery manager for [employer]: Not on [employer] Insecurity: No Food Insecurity (12/26/2023)   Hunger Vital Sign    Worried About Running Out of Food in the Last Year: Never true    Ran Out of Food in the Last Year: Never true  Transportation Needs: No Transportation Needs (12/26/2023)   PRAPARE - Administrator, Civil Service (Medical): No    Lack of Transportation (Non-Medical): No  Physical Activity: Not on file  Stress: Not on file  Social Connections: Not on file  Intimate Partner Violence: Not At Risk (12/26/2023)   Humiliation, Afraid, Rape, and Kick questionnaire    Fear of Current or Ex-Partner: No    Emotionally Abused: No    Physically Abused: No    Sexually Abused: No    Review of Systems: Pertinent positive and negative review of systems were noted in the above HPI section.  All other review of systems was otherwise negative.   Physical Exam: Vital signs in last 24 hours: Temp:  [97.6 F (36.4 C)-98.2 F (36.8 C)] 97.6 F (36.4 C) (09/13 1314) Pulse Rate:  [89-98] 89 (09/13 1314) Resp:  [14-19] 19 (09/13 1314) BP: (157-181)/(90-107) 181/107 (09/13 1314) SpO2:  [98 %-100 %] 99 % (09/13 1314) Weight:  [83.8 kg] 83.8 kg (09/13 1314) Last BM Date : 12/26/23 General:   Alert,  Well-developed, well-nourished older white male, pleasant and cooperative in NAD, wife at bedside Head:  Normocephalic and atraumatic. Eyes:  Sclera clear, no icterus.   Conjunctiva pink. Ears:  Normal auditory acuity. Nose:  No deformity, discharge,  or lesions. Mouth:  No deformity or lesions.   Neck:  Supple; no masses or thyromegaly. Lungs:  Clear throughout to auscultation.   No wheezes, crackles, or rhonchi.  Heart:  Regular rate and rhythm; no murmurs, clicks, rubs,  or gallops. Abdomen:  Soft,nontender, very mild tenderness in the suprapubic area no guarding or rebound no palpable mass or  hepatosplenomegaly bowel  sounds present Rectal: Not done, documented to have gross blood on exam per ER MD this morning Msk:  Symmetrical without gross deformities. . Pulses:  Normal pulses noted. Extremities:  Without clubbing or edema. Neurologic:  Alert and  oriented x4;  grossly normal neurologically. Skin:  Intact without significant lesions or rashes.. Psych:  Alert and cooperative. Normal mood and affect.  Intake/Output from previous day: No intake/output data recorded. Intake/Output this shift: No intake/output data recorded.  Lab Results: Recent Labs    12/26/23 0730 12/26/23 1403  WBC 7.7  --   HGB 13.2 12.9*  HCT 38.1* 39.0  PLT 161  --    BMET Recent Labs    12/26/23 0730  NA 140  K 4.1  CL 105  CO2 21*  GLUCOSE 165*  BUN 20  CREATININE 0.92  CALCIUM  9.0   LFT Recent Labs    12/26/23 0730  PROT 6.6  ALBUMIN  4.4  AST 27  ALT 40  ALKPHOS 63  BILITOT 0.2   PT/INR Recent Labs    12/26/23 0730  LABPROT 13.5  INR 1.0     IMPRESSION:  #42 63 year old white male who presented to the emergency room after onset of grossly bloody bowel movements earlier this morning which awakened him from sleep with some abdominal cramping.  He had about 4 episodes of dark red blood in the commode prior to coming to the emergency room.  He has been quite hemodynamically stable and on arrival hemoglobin 13.2  BUN normal Follow-up hemoglobin this afternoon 12.9  Fortunately he has not had any further episodes of bleeding since arrival to the ER.  He has been on meloxicam over the past couple of weeks for carpal tunnel symptoms  Bleeding is very consistent with what we generally see with diverticular hemorrhage which at this point seems to be self-limited Possibly exacerbated by recent NSAID use Patient has previously documented pandiverticulosis with last colonoscopy 2017  #2 right wrist pain/carpal tunnel #3 coronary artery disease status post CABG #4 history of hypertension #5  history of kidney stones #6 history of recurrent adenomatous polyps-last colonoscopy 2017 and overdue for follow-up.   PLAN: Full liquid diet Continue to trend hemoglobin every 8 hours, transfuse as indicated Stop meloxicam  GI will follow-up in a.m., if he does not have any further bleeding overnight into tomorrow he may actually be able to be discharged and then plan for outpatient colonoscopy with Dr. Shila.  If he does have any further evidence of bleeding or drop in hemoglobin and we can plan for bowel prep tomorrow and colonoscopy potentially on Monday. Plans were discussed with the patient and wife, and all questions answered.   Amy EsterwoodPA-C 12/26/2023, 4:38 PM   Attending physician's note  I personally saw the patient and performed a substantive portion of the medical decision making process for this encounter (including a complete performance of the key components : MDM, Hx and Exam), in conjunction with the APP.  I agree with the APP's note, impression, and  the management plan for the number and complexity of problems addressed at the encounter for the patient and take responsibility for that plan with its inherent risk of complications, morbidity, or mortality with additional input as follows.    63 yr very pleasant gentleman presented with acute painless lower GI bleed  On exam abd soft, no tenderness or distension  No further bleeding since admission Hemodynamically stable, Hgb 13.2 earlier this AM  Continue to monitor CBC  and transfuse if below 7 Avoid NSAID's  Presentation is concerning for acute diverticular hemorrhage, is due for surveillance colonoscopy, neoplastic lesion also in the differential.  Will plan for colonoscopy soon inpatient vs outpatient based on clinical course Full liquid diet, will advance further tomorrow if no bleeding  If develops large volume hematochezia with hemodynamic instability will need stat CTA and possible embolization by IR     Moderate complex decision making (this includes chart review, review of results, face-to-face time used for counseling as well as treatment plan and follow-up. The patient was provided an opportunity to ask questions and all were answered. The patient agreed with the plan and demonstrated an understanding of the instructions.  LOIS Wilkie Mcgee , MD [phone]

## 2023-12-26 NOTE — H&P (Signed)
 History and Physical  Luke Smith:[id number] DOB: Feb 05, 1961 DOA: 12/26/2023  PCP: Clarice Nottingham, MD   Chief Complaint: Rectal bleeding  HPI: Luke Smith is a 63 y.o. adult with medical history significant for CAD, hypertension, GERD admitted to the hospital with bright red blood per rectum.  Patient was in his usual state of health until earlier this morning about 4 AM when he was woken from sleep feeling like he had to have a bowel movement.  He thereafter, he had 2 more bowel movements each was mostly bright red blood.  He denies nausea, abdominal pain, rectal pain, or any prior history of GI bleeding.  He has had normal colonoscopies states they were normal other than some small polyps removed.  Denies any changes in bowel or bladder habits, weight loss, or any other concerns.  He used to take antihypertensives, but did not like how they made him feel and so discontinued the medication.  Review of Systems: Please see HPI for pertinent positives and negatives. A complete 10 system review of systems are otherwise negative.  Past Medical History:  Diagnosis Date   Coronary artery disease    a.01/17/16: 4V CABG (LIMA to LAD, RIMA to RCA, SVG to D1, SVG to OM)   b. 07/2016: 2/4 patent bypass grafts. SVG--> OM down, s/p DES to OM, SVG--> D2 down but 50% occl D2 with good native flow   GERD (gastroesophageal reflux disease)    History of kidney stones    Hypertension    Kidney stone    Perforated ear drum    left ear   Sleep apnea    Past Surgical History:  Procedure Laterality Date   CARDIAC CATHETERIZATION N/A 01/16/2016   Procedure: Left Heart Cath and Coronary Angiography;  Surgeon: Victory LELON Sharps, MD;  Location: [facility] INVASIVE CV LAB;  Service: Cardiovascular;  Laterality: N/A;   CORONARY ARTERY BYPASS GRAFT N/A 01/17/2016   Procedure: CORONARY ARTERY BYPASS GRAFTING (CABG) x 4 (LIMA to LAD, SVG to DIAGONAL, SVG to OM, and RIMA to RCA) with EVH from RIGHT THIGH GREATER SAPHENOUS VEIN  and BILATERAL MAMMARY HARVEST;  Surgeon: Dorise MARLA Fellers, MD;  Location: [facility] OR;  Service: Open Heart Surgery;  Laterality: N/A;   CORONARY STENT INTERVENTION N/A 07/14/2016   Procedure: Coronary Stent Intervention;  Surgeon: Candyce GORMAN Reek, MD;  Location: [facility] INVASIVE CV LAB;  Service: Cardiovascular;  Laterality: N/A;   CYSTOSCOPY/URETEROSCOPY/HOLMIUM LASER/STENT PLACEMENT Bilateral 05/13/2021   Procedure: CYSTOSCOPY/ RETROGRADE/URETEROSCOPY/HOLMIUM LASER/STENT PLACEMENT;  Surgeon: Renda Glance, MD;  Location: [facility] ORS;  Service: Urology;  Laterality: Bilateral;   DENTAL SURGERY  childhood   LEFT HEART CATH AND CORS/GRAFTS ANGIOGRAPHY N/A 07/14/2016   Procedure: Left Heart Cath and Cors/Grafts Angiography;  Surgeon: Candyce GORMAN Reek, MD;  Location: [facility] INVASIVE CV LAB;  Service: Cardiovascular;  Laterality: N/A;   NASAL SEPTUM SURGERY     TEE WITHOUT CARDIOVERSION N/A 01/17/2016   Procedure: TRANSESOPHAGEAL ECHOCARDIOGRAM (TEE);  Surgeon: Dorise MARLA Fellers, MD;  Location: [facility] OR;  Service: Open Heart Surgery;  Laterality: N/A;   Social History:  reports that he quit smoking about 22 years ago. His smoking use included cigarettes. He started smoking about 42 years ago. He has a 10 pack-year smoking history. He has never used smokeless tobacco. He reports current alcohol use of about 6.0 standard drinks of alcohol per week. He reports that he does not use drugs.  Allergies  Allergen Reactions   Aspirin  Other (See Comments)    Acid  reflux if he takes more than one     Family History  Problem Relation Age of Onset   Bone cancer Father    Colon cancer Neg Hx      Prior to Admission medications   Medication Sig Start Date End Date Taking? Authorizing Provider  Ascorbic Acid (VITAMIN C) 1000 MG tablet Take 1,000 mg by mouth 2 (two) times daily.    [provider]  aspirin  (ASPIRIN  LOW DOSE) 81 MG EC tablet Take 1 tablet (81 mg total) by mouth daily. 07/13/17   Court Dorn PARAS, MD   atorvastatin  (LIPITOR ) 80 MG tablet Take 1 tablet (80 mg total) by mouth daily. 03/18/21 03/18/22  Court Dorn PARAS, MD  Cholecalciferol  (VITAMIN D3) 5000 units CAPS Take 5,000 Units by mouth daily.    [provider]  cyclobenzaprine  (FLEXERIL ) 10 MG tablet Take 1 tablet (10 mg total) by mouth 2 (two) times daily as needed for muscle spasms. 02/05/16   Zackowski, Scott, MD  HYDROcodone-acetaminophen  (NORCO/VICODIN) 5-325 MG tablet Take 1 tablet by mouth every 6 (six) hours as needed (for back pain).     [provider]  Magnesium  400 MG CAPS Take 1 tablet by mouth daily.    [provider]  nebivolol (BYSTOLIC) 5 MG tablet Take 5 mg by mouth daily.    [provider]  nitroGLYCERIN  (NITROSTAT ) 0.4 MG SL tablet Place 1 tablet (0.4 mg total) under the tongue every 5 (five) minutes as needed for chest pain. 11/05/16   Court Dorn PARAS, MD  phenazopyridine  (PYRIDIUM ) 200 MG tablet Take 1 tablet (200 mg total) by mouth 3 (three) times daily as needed for pain. 05/13/21   Renda Glance, MD  SILDENAFIL CITRATE PO Take 1-5 tablets by mouth as needed.    [provider]  tamsulosin (FLOMAX) 0.4 MG CAPS capsule Take 0.4 mg by mouth.    [provider]  zolpidem  (AMBIEN ) 10 MG tablet Take 10 mg by mouth at bedtime as needed for sleep.    [provider]    Physical Exam: BP (!) 181/107 (BP Location: Right Arm)   Pulse 89   Temp 97.6 F (36.4 C) (Oral)   Resp 19   Ht 5' 9 (1.753 m)   Wt 83.8 kg   SpO2 99%   BMI 27.28 kg/m  General:  Alert, oriented, calm, in no acute distress, his wife is at the bedside Eyes: EOMI, clear conjuctivae, white sclerea Neck: supple, no masses, trachea mildline  Cardiovascular: RRR, no murmurs or rubs, no peripheral edema  Respiratory: clear to auscultation bilaterally, no wheezes, no crackles  Abdomen: soft, nontender, nondistended, normal bowel tones heard  Skin: dry, no rashes  Musculoskeletal: no  joint effusions, normal range of motion  Psychiatric: appropriate affect, normal speech  Neurologic: extraocular muscles intact, clear speech, moving all extremities with intact sensorium         Labs on Admission:  Basic Metabolic Panel: Recent Labs  Lab 12/26/23 0730  NA 140  K 4.1  CL 105  CO2 21*  GLUCOSE 165*  BUN 20  CREATININE 0.92  CALCIUM  9.0   Liver Function Tests: Recent Labs  Lab 12/26/23 0730  AST 27  ALT 40  ALKPHOS 63  BILITOT 0.2  PROT 6.6  ALBUMIN  4.4   No results for input(s): LIPASE, AMYLASE in the last 168 hours. No results for input(s): AMMONIA in the last 168 hours. CBC: Recent Labs  Lab 12/26/23 0730 12/26/23 1403  WBC 7.7  --  NEUTROABS 5.5  --   HGB 13.2 12.9*  HCT 38.1* 39.0  MCV 90.1  --   PLT 161  --    Cardiac Enzymes: No results for input(s): CKTOTAL, CKMB, CKMBINDEX, TROPONINI in the last 168 hours. BNP (last 3 results) No results for input(s): BNP in the last 8760 hours.  ProBNP (last 3 results) No results for input(s): PROBNP in the last 8760 hours.  CBG: No results for input(s): GLUCAP in the last 168 hours.  Radiological Exams on Admission: No results found. Assessment/Plan  Luke Smith is a 63 y.o. adult with medical history significant for CAD, hypertension, GERD admitted to the hospital with bright red blood per rectum.  Bright red blood per rectum-patient is hemodynamically stable, with normal hemoglobin, not on any blood thinners.  Differential includes diverticular bleed, AVM, versus other.  Has not had any bowel movements since early this morning. -Observation admission -Clear liquid diet, n.p.o. after midnight -Avoid blood thinners -Trend hemoglobin -ER provider discussed with [facility], who will consult  Hypertension-uncontrolled, does not take any medication at home.  Previously did not tolerate beta-blockers well due to fatigue. -Start amlodipine  5 mg p.o. daily -IV hydralazine   for SBP greater than 160  DVT prophylaxis: SCDs only    Code Status: Full Code  Consults called: [facility]  Admission status: Observation  Time spent: 53 minutes  Luke Nicotra CHRISTELLA Gail MD [facility] Pager [phone]  If 7PM-7AM, please contact night-coverage [email or web address] Password [email or web address]  12/26/2023, 3:11 PM

## 2023-12-26 NOTE — ED Triage Notes (Signed)
 Pt presents with complaints of rectal bleeding. Pt reports waking up this am with 3 episodes of bloody diarrhea. Endorses nausea and dizziness. No abd pain.

## 2023-12-27 ENCOUNTER — Telehealth: Payer: Self-pay

## 2023-12-27 DIAGNOSIS — Z860101 Personal history of adenomatous and serrated colon polyps: Secondary | ICD-10-CM | POA: Diagnosis not present

## 2023-12-27 DIAGNOSIS — K922 Gastrointestinal hemorrhage, unspecified: Secondary | ICD-10-CM | POA: Diagnosis not present

## 2023-12-27 LAB — CBC
HCT: 37.9 % — ABNORMAL LOW (ref 39.0–52.0)
Hemoglobin: 11.9 g/dL — ABNORMAL LOW (ref 13.0–17.0)
MCH: 29.8 pg (ref 26.0–34.0)
MCHC: 31.4 g/dL (ref 30.0–36.0)
MCV: 94.8 fL (ref 80.0–100.0)
Platelets: 176 K/uL (ref 150–400)
RBC: 4 MIL/uL — ABNORMAL LOW (ref 4.22–5.81)
RDW: 12.4 % (ref 11.5–15.5)
WBC: 7.2 K/uL (ref 4.0–10.5)
nRBC: 0 % (ref 0.0–0.2)

## 2023-12-27 LAB — BASIC METABOLIC PANEL WITH GFR
Anion gap: 16 — ABNORMAL HIGH (ref 5–15)
BUN: 16 mg/dL (ref 8–23)
CO2: 19 mmol/L — ABNORMAL LOW (ref 22–32)
Calcium: 9.5 mg/dL (ref 8.9–10.3)
Chloride: 103 mmol/L (ref 98–111)
Creatinine, Ser: 0.83 mg/dL (ref 0.61–1.24)
GFR, Estimated: >60 mL/min (ref >60)
Glucose, Bld: 117 mg/dL — ABNORMAL HIGH (ref 70–99)
Potassium: 4.6 mmol/L (ref 3.5–5.1)
Sodium: 138 mmol/L (ref 135–145)

## 2023-12-27 LAB — HIV ANTIBODY (ROUTINE TESTING W REFLEX): HIV Screen 4th Generation wRfx: NONREACTIVE

## 2023-12-27 LAB — HEMOGLOBIN AND HEMATOCRIT, BLOOD
HCT: 40.2 % (ref 39.0–52.0)
Hemoglobin: 12.6 g/dL — ABNORMAL LOW (ref 13.0–17.0)

## 2023-12-27 MED ORDER — AMLODIPINE BESYLATE 10 MG PO TABS: 10.0000 mg | ORAL_TABLET | Freq: Every day | ORAL | 0 refills | Status: AC

## 2023-12-27 NOTE — Discharge Summary (Signed)
 Physician Discharge Summary  Luke Smith FMW:[id number] DOB: 03-10-1961 DOA: 12/26/2023  PCP: Clarice Nottingham, MD  Admit date: 12/26/2023 Discharge date: 12/27/2023  Admitted From: Home Disposition: Home  Recommendations for Outpatient Follow-up:  Follow up with PCP in 1-2 weeks Outpatient follow-up with  gastroenterology for colonoscopy Started on amlodipine  10 mg p.o. daily for poorly controlled hypertension Recommend repeat CBC 1 week to reassess hemoglobin level  Home Health: No Equipment/Devices: None  Discharge Condition: Stable CODE STATUS: Full code Diet recommendation: Heart healthy diet  History of present illness:  Luke Smith is a 63 year old male with past medical history significant for HTN, CAD, GERD, BPH who presented to [facility] ED on 12/26/2023 with complaints of rectal bleeding.  Reports awoken in the a.m. with 3 episodes of bloody diarrhea associate with nausea and dizziness.  Denies abdominal pain.  No history of GI bleed in the past.  Denies antiplatelet/anticoagulant use.  Does endorse recent use of meloxicam otherwise no other NSAIDs.  No changes in bowel or bladder habits, no unintentional weight loss.  In the ED, temperature 98.2 F, HR 98, RR 18, BP 157/93, SpO2 99% on room air.  WBC 7.7, hemoglobin 13.2, platelet count 161.  Sodium 140, potassium 4.1, chloride 105, CO2 21, glucose 165, BUN 20, creatinine 0.92.  AST 27, ALT 40, total Ruben 0.2.  FOBT positive.  GI was consulted.  TRH consulted for admission for further evaluation management of GI bleed.  Hospital course:  Lower GI bleed likely secondary to diverticulosis Patient presenting to ED with painless hematochezia x 3.  Denies anticoagulant/antiplatelet use, does endorse meloxicam use most recently but no other NSAIDs.  Seen by gastroenterology, no further bloody bowel movements and hemoglobin remained stable; 12.6 at time of discharge.  Plan for outpatient colonoscopy.  Recommend repeat CBC 1  week.  Outpatient follow-up with GI.  Essential hypertension, uncontrolled Started on amlodipine  10 mg p.o. daily.  Outpatient follow-up with PCP.  BPH Continue tamsulosin   Discharge Diagnoses:  Principal Problem:   Lower GI bleed    Discharge Instructions  Discharge Instructions     Call MD for:  difficulty breathing, headache or visual disturbances   Complete by: As directed    Call MD for:  extreme fatigue   Complete by: As directed    Call MD for:  persistant dizziness or light-headedness   Complete by: As directed    Call MD for:  persistant nausea and vomiting   Complete by: As directed    Call MD for:  severe uncontrolled pain   Complete by: As directed    Call MD for:  temperature >100.4   Complete by: As directed    Diet - low sodium heart healthy   Complete by: As directed    Increase activity slowly   Complete by: As directed       Allergies as of 12/27/2023       Reactions   Aspirin  Other (See Comments)   Acid reflux if he takes more than one         Medication List     TAKE these medications    amLODipine  10 MG tablet Commonly known as: NORVASC  Take 1 tablet (10 mg total) by mouth daily.   HYDROcodone-acetaminophen  5-325 MG tablet Commonly known as: NORCO/VICODIN Take 1 tablet by mouth every 6 (six) hours as needed (for back pain).   Magnesium  400 MG Caps Take 1 tablet by mouth daily.   nitroGLYCERIN  0.4 MG SL tablet Commonly known  as: NITROSTAT  Place 1 tablet (0.4 mg total) under the tongue every 5 (five) minutes as needed for chest pain.   SILDENAFIL CITRATE PO Take 1-5 tablets by mouth as needed.   tamsulosin 0.4 MG Caps capsule Commonly known as: FLOMAX Take 0.4 mg by mouth.   vitamin C 1000 MG tablet Take 1,000 mg by mouth 2 (two) times daily.   Vitamin D3 125 MCG (5000 UT) Caps Take 5,000 Units by mouth daily.   zolpidem  10 MG tablet Commonly known as: AMBIEN  Take 10 mg by mouth at bedtime as needed for sleep.         Follow-up Information     Clarice Nottingham, MD. Schedule an appointment as soon as possible for a visit in 1 week(s).   Specialty: Internal Medicine Contact information: [address] [place] [address] [place] [address] [phone]         Shila Gustav GAILS, MD. Schedule an appointment as soon as possible for a visit.   Specialty: Gastroenterology Why: Outpatient colonoscopy Contact information: [address] [place] [address] [phone]                Allergies  Allergen Reactions   Aspirin  Other (See Comments)    Acid reflux if he takes more than one     Consultations: [facility]   Procedures/Studies: No results found.   Subjective: Patient seen examined bedside, lying in bed.  No complaints this morning.  No further bloody bowel movements.  Hemoglobin remained stable.  Seen by GI with plans for outpatient follow-up with colonoscopy.  No other questions or concerns at this time.  Denies headache, no dizziness, no chest pain, no palpitations, no shortness of breath, no abdominal pain, no fever/chills/night sweats, no nausea/vomiting/diarrhea, no focal weakness, no fatigue, no paresthesia.  No acute events overnight per nursing staff.  Discharge Exam: Vitals:   12/27/23 0506 12/27/23 1411  BP: (!) 158/89 (!) 149/90  Pulse: 78 95  Resp: 15 18  Temp: 97.9 F (36.6 C) 97.9 F (36.6 C)  SpO2: 99% 100%   Vitals:   12/26/23 2127 12/27/23 0000 12/27/23 0506 12/27/23 1411  BP: (!) 159/90  (!) 158/89 (!) 149/90  Pulse: 83  78 95  Resp: 18 12 15 18   Temp: 98 F (36.7 C)  97.9 F (36.6 C) 97.9 F (36.6 C)  TempSrc: Oral  Oral   SpO2: 100%  99% 100%  Weight:      Height:        Physical Exam: GEN: NAD, alert and oriented x 3, wd/wn HEENT: NCAT, PERRL, EOMI, sclera clear, MMM PULM: CTAB w/o wheezes/crackles, normal respiratory effort, on room air CV: RRR w/o M/G/R GI: abd soft, NTND, NABS, no R/G/M MSK: no peripheral  edema, muscle strength globally intact 5/5 bilateral upper/lower extremities NEURO: CN II-XII intact, no focal deficits, sensation to light touch intact PSYCH: normal mood/affect Integumentary: dry/intact, no rashes or wounds    The results of significant diagnostics from this hospitalization (including imaging, microbiology, ancillary and laboratory) are listed below for reference.     Microbiology: No results found for this or any previous visit (from the past 240 hours).   Labs: BNP (last 3 results) No results for input(s): BNP in the last 8760 hours. Basic Metabolic Panel: Recent Labs  Lab 12/26/23 0730 12/27/23 0452  NA 140 138  K 4.1 4.6  CL 105 103  CO2 21* 19*  GLUCOSE 165* 117*  BUN 20 16  CREATININE 0.92 0.83  CALCIUM  9.0 9.5   Liver Function Tests: Recent Labs  Lab 12/26/23 0730  AST 27  ALT 40  ALKPHOS 63  BILITOT 0.2  PROT 6.6  ALBUMIN  4.4   No results for input(s): LIPASE, AMYLASE in the last 168 hours. No results for input(s): AMMONIA in the last 168 hours. CBC: Recent Labs  Lab 12/26/23 0730 12/26/23 1403 12/26/23 2041 12/27/23 0452 12/27/23 1350  WBC 7.7  --   --  7.2  --   NEUTROABS 5.5  --   --   --   --   HGB 13.2 12.9* 11.6* 11.9* 12.6*  HCT 38.1* 39.0 36.9* 37.9* 40.2  MCV 90.1  --   --  94.8  --   PLT 161  --   --  176  --    Cardiac Enzymes: No results for input(s): CKTOTAL, CKMB, CKMBINDEX, TROPONINI in the last 168 hours. BNP: Invalid input(s): POCBNP CBG: No results for input(s): GLUCAP in the last 168 hours. D-Dimer No results for input(s): DDIMER in the last 72 hours. Hgb A1c No results for input(s): HGBA1C in the last 72 hours. Lipid Profile No results for input(s): CHOL, HDL, LDLCALC, TRIG, CHOLHDL, LDLDIRECT in the last 72 hours. Thyroid function studies No results for input(s): TSH, T4TOTAL, T3FREE, THYROIDAB in the last 72 hours.  Invalid input(s): FREET3 Anemia  work up No results for input(s): VITAMINB12, FOLATE, FERRITIN, TIBC, IRON, RETICCTPCT in the last 72 hours. Urinalysis    Component Value Date/Time   COLORURINE AMBER (A) 02/05/2016 1036   APPEARANCEUR CLEAR 02/05/2016 1036   LABSPEC 1.026 02/05/2016 1036   PHURINE 5.5 02/05/2016 1036   GLUCOSEU NEGATIVE 02/05/2016 1036   HGBUR NEGATIVE 02/05/2016 1036   BILIRUBINUR SMALL (A) 02/05/2016 1036   KETONESUR 15 (A) 02/05/2016 1036   PROTEINUR NEGATIVE 02/05/2016 1036   NITRITE NEGATIVE 02/05/2016 1036   LEUKOCYTESUR NEGATIVE 02/05/2016 1036   Sepsis Labs Recent Labs  Lab 12/26/23 0730 12/27/23 0452  WBC 7.7 7.2   Microbiology No results found for this or any previous visit (from the past 240 hours).   Time coordinating discharge: Over 30 minutes  SIGNED:   Camellia PARAS [place], DO  [facility] 12/27/2023, 3:06 PM

## 2023-12-27 NOTE — Telephone Encounter (Signed)
-----   Message from Gordy HERO Pyrtle sent at 12/27/2023 10:00 AM EDT ----- Pod C: Patient admitted with acute diverticular hemorrhage Needs to follow-up with the pod C app and 2 to 4 weeks Plan for eventual outpatient colonoscopy with Dr. Nandigam Likely to go home this evening or tomorrow JMP

## 2023-12-27 NOTE — Progress Notes (Signed)
 Reviewed discharge instructions per the AVS including medications and follow up appointments with patient. Patient verbalized understanding. Yoana Staib, Lonell Louder, RN

## 2023-12-27 NOTE — Plan of Care (Signed)

## 2023-12-27 NOTE — Plan of Care (Signed)
   Problem: Health Behavior/Discharge Planning: Goal: Ability to manage health-related needs will improve Outcome: Completed/Met

## 2023-12-27 NOTE — Progress Notes (Signed)
    Progress Note   Assessment    63 year old male with a history of adenomatous colon polyps, colonic diverticulosis, CAD status post CABG, hypertension, kidney stones admitted with acute lower GI bleeding consistent with diverticular hemorrhage  Principal Problem:   Lower GI bleed   Recommendations   1.  Lower GI bleed secondary to presumed diverticulosis --painless hematochezia now resolved.  We discussed how this could stutter and so will need observation as we are doing.  Will advance diet.  Hemoglobin is down 1 to 2 g but stable. -- Advance to regular diet -- Repeat hemoglobin this morning with possibility for discharge this evening or in the morning -- Defer colonoscopy to outpatient with Dr. Nandigam which is patient preference -- In the event of acute rebleeding patient should have a stat CTA of the abdomen and pelvis, bleeding protocol.  If positive then a stat IR consultation for angiography with possible embolization -- He should avoid meloxicam  35 minutes total spent today including patient facing time, coordination of care, reviewing medical history/procedures/pertinent radiology studies, and documentation of the encounter.    Chief Complaint   No abdominal pain Had a full liquid breakfast No nausea or vomiting Had a small bowel movement with some old blood but no fresh blood or clots  Vital signs in last 24 hours: Temp:  [97.6 F (36.4 C)-98.1 F (36.7 C)] 97.9 F (36.6 C) (09/14 0506) Pulse Rate:  [78-91] 78 (09/14 0506) Resp:  [12-19] 15 (09/14 0506) BP: (151-181)/(89-107) 158/89 (09/14 0506) SpO2:  [98 %-100 %] 99 % (09/14 0506) Weight:  [83.8 kg] 83.8 kg (09/13 1314) Last BM Date : 12/27/23 Gen: awake, alert, NAD HEENT: anicteric  CV: RRR, no mrg Abd: soft, NT/ND, +BS throughout Ext: no c/c/e Neuro: nonfocal  Intake/Output from previous day: 09/13 0701 - 09/14 0700 In: 560 [P.O.:560] Out: -  Intake/Output this shift: Total I/O In: 360  [P.O.:360] Out: -   Lab Results: Recent Labs    12/26/23 0730 12/26/23 1403 12/26/23 2041 12/27/23 0452  WBC 7.7  --   --  7.2  HGB 13.2 12.9* 11.6* 11.9*  HCT 38.1* 39.0 36.9* 37.9*  PLT 161  --   --  176   BMET Recent Labs    12/26/23 0730 12/27/23 0452  NA 140 138  K 4.1 4.6  CL 105 103  CO2 21* 19*  GLUCOSE 165* 117*  BUN 20 16  CREATININE 0.92 0.83  CALCIUM  9.0 9.5   LFT Recent Labs    12/26/23 0730  PROT 6.6  ALBUMIN  4.4  AST 27  ALT 40  ALKPHOS 63  BILITOT 0.2   PT/INR Recent Labs    12/26/23 0730  LABPROT 13.5  INR 1.0    Studies/Results: No results found.    LOS: 0 days   Gordy CHRISTELLA Starch, MD 12/27/2023, 9:56 AM See TRACEY,  GI, to contact our on call provider

## 2023-12-28 NOTE — Telephone Encounter (Signed)
 Spoke with the patient and scheduled his follow up appointment 01/15/24 at 10:20 am.

## 2024-01-13 ENCOUNTER — Telehealth: Payer: Self-pay | Admitting: Gastroenterology

## 2024-01-13 NOTE — Telephone Encounter (Signed)
 Inbound call from patient stating he would like to know if he needs to get blood work done before his appointment on Friday 01/15/24.  Requesting a call back  Please advise  Thank you

## 2024-01-13 NOTE — Telephone Encounter (Signed)
 Spoke with pt. He has a hospital f/u appt tomorrow with Harlene for diverticular hemorrhage. Per hospital D/C note, Recommend repeat CBC 1 week to reassess hemoglobin level  This has not been drawn. Pt states he just saw his PCP yesterday and they were not able to order blood due to the system being down. Pt wants to know if he needs blood work prior to his OV tomorrow, or if he can wait until he comes in to get blood work done. Will route to provider to review and advise.

## 2024-01-13 NOTE — Telephone Encounter (Signed)
 Spoke with pt. He reports that he went to his PCP to have a CBC drawn. He will not need to come here. Pt reports that he just spoke with someone about rescheduling his appt.

## 2024-01-15 ENCOUNTER — Ambulatory Visit: Admitting: Gastroenterology

## 2024-03-01 ENCOUNTER — Ambulatory Visit (INDEPENDENT_AMBULATORY_CARE_PROVIDER_SITE_OTHER): Admitting: Gastroenterology

## 2024-03-01 ENCOUNTER — Encounter: Payer: Self-pay | Admitting: Gastroenterology

## 2024-03-01 VITALS — BP 130/78 | HR 66 | Ht 69.0 in | Wt 199.0 lb

## 2024-03-01 DIAGNOSIS — Z860100 Personal history of colon polyps, unspecified: Secondary | ICD-10-CM | POA: Diagnosis not present

## 2024-03-01 DIAGNOSIS — K922 Gastrointestinal hemorrhage, unspecified: Secondary | ICD-10-CM

## 2024-03-01 MED ORDER — NA SULFATE-K SULFATE-MG SULF 17.5-3.13-1.6 GM/177ML PO SOLN: 1.0000 | Freq: Once | ORAL | 0 refills | Status: AC

## 2024-03-01 NOTE — Progress Notes (Signed)
 03/01/2024 Luke Smith [id number] 01-May-1960   Discussed the use of AI scribe software for clinical note transcription with the patient, who gave verbal consent to proceed.  History of Present Illness Luke Smith is a 63 year old male who presents for hospital follow-up after a presumed diverticular bleed.  In September, he experienced a gastrointestinal bleed presumed to be due to a diverticular bleed. He was hospitalized, and the bleeding resolved without recurrence. Since discharge on December 27, 2023, he has not experienced any further bleeding. At discharge, his hemoglobin was 12.6 g/dL. Subsequent blood work was normal, Hgb 13.4 grams in early October at PCPs office.  He is overdue for a colonoscopy, with the last one performed in 2017, which revealed a 9 mm polyp/TA.  He was previously on meloxicam for wrist pain, but he is no longer taking it. He is not currently on medication for acid reflux and has no issues with heartburn or indigestion.  No current issues with bowel movements, no blood in stools, and no abdominal pain. His appetite is good.   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 Esophageal B biopsy did not show any intestinal metaplasia to suggest Barrett's Colon biopsies/tubular adenomas.  He is overdue for follow-up   Past Medical History:  Diagnosis Date   Coronary artery disease    a.01/17/16: 4V CABG (LIMA to LAD, RIMA to RCA, SVG to D1, SVG to OM)   b. 07/2016: 2/4 patent bypass grafts. SVG--> OM down, s/p DES to OM, SVG--> D2 down but 50% occl D2 with good native flow   GERD (gastroesophageal reflux disease)    History of kidney stones    Hypertension    Kidney stone     Perforated ear drum    left ear   Sleep apnea    Past Surgical History:  Procedure Laterality Date   CARDIAC CATHETERIZATION N/A 01/16/2016   Procedure: Left Heart Cath and Coronary Angiography;  Surgeon: Victory LELON Sharps, MD;  Location: [facility] INVASIVE CV LAB;  Service: Cardiovascular;  Laterality: N/A;   CORONARY ARTERY BYPASS GRAFT N/A 01/17/2016   Procedure: CORONARY ARTERY BYPASS GRAFTING (CABG) x 4 (LIMA to LAD, SVG to DIAGONAL, SVG to OM, and RIMA to RCA) with EVH from RIGHT THIGH GREATER SAPHENOUS VEIN and BILATERAL MAMMARY HARVEST;  Surgeon: Dorise MARLA Fellers, MD;  Location: [facility] OR;  Service: Open Heart Surgery;  Laterality: N/A;   CORONARY STENT INTERVENTION N/A 07/14/2016   Procedure: Coronary Stent Intervention;  Surgeon: Candyce GORMAN Reek, MD;  Location: [facility] INVASIVE CV LAB;  Service: Cardiovascular;  Laterality: N/A;   CYSTOSCOPY/URETEROSCOPY/HOLMIUM LASER/STENT PLACEMENT Bilateral 05/13/2021   Procedure: CYSTOSCOPY/ RETROGRADE/URETEROSCOPY/HOLMIUM LASER/STENT PLACEMENT;  Surgeon: Renda Glance, MD;  Location: [facility] ORS;  Service: Urology;  Laterality: Bilateral;   DENTAL SURGERY  childhood   LEFT HEART CATH AND CORS/GRAFTS ANGIOGRAPHY N/A 07/14/2016   Procedure: Left Heart Cath and Cors/Grafts Angiography;  Surgeon: Candyce GORMAN Reek, MD;  Location: [facility] INVASIVE CV LAB;  Service: Cardiovascular;  Laterality: N/A;   NASAL SEPTUM SURGERY     TEE WITHOUT CARDIOVERSION N/A 01/17/2016   Procedure: TRANSESOPHAGEAL ECHOCARDIOGRAM (TEE);  Surgeon: Dorise MARLA Fellers, MD;  Location: [facility] OR;  Service: Open Heart Surgery;  Laterality: N/A;    reports  that he quit smoking about 22 years ago. His smoking use included cigarettes. He started smoking about 42 years ago. He has a 10 pack-year smoking history. He has never used smokeless tobacco. He reports current alcohol use of about 6.0 standard drinks of alcohol per week. He reports that he does not use drugs. family history includes Bone cancer in his  father. Allergies  Allergen Reactions   Aspirin  Other (See Comments)    Acid reflux if he takes more than one       Outpatient Encounter Medications as of 03/01/2024  Medication Sig   amLODipine  (NORVASC ) 10 MG tablet Take 1 tablet (10 mg total) by mouth daily.   Ascorbic Acid (VITAMIN C) 1000 MG tablet Take 1,000 mg by mouth 2 (two) times daily.   Cholecalciferol  (VITAMIN D3) 5000 units CAPS Take 5,000 Units by mouth daily.   HYDROcodone-acetaminophen  (NORCO/VICODIN) 5-325 MG tablet Take 1 tablet by mouth every 6 (six) hours as needed (for back pain).    Magnesium  400 MG CAPS Take 1 tablet by mouth daily.   nebivolol (BYSTOLIC) 5 MG tablet Take 5 mg by mouth daily.   nitroGLYCERIN  (NITROSTAT ) 0.4 MG SL tablet Place 1 tablet (0.4 mg total) under the tongue every 5 (five) minutes as needed for chest pain.   SILDENAFIL CITRATE PO Take 1-5 tablets by mouth as needed.   tamsulosin (FLOMAX) 0.4 MG CAPS capsule Take 0.4 mg by mouth.   zolpidem  (AMBIEN ) 10 MG tablet Take 10 mg by mouth at bedtime as needed for sleep.   No facility-administered encounter medications on file as of 03/01/2024.     REVIEW OF SYSTEMS  : All other systems reviewed and negative except where noted in the History of Present Illness.   PHYSICAL EXAM: BP 130/78   Pulse 66   Ht 5' 9 (1.753 m)   Wt 199 lb (90.3 kg)   BMI 29.39 kg/m  General: Well developed white male in no acute distress Head: Normocephalic and atraumatic Eyes:  Sclerae anicteric, conjunctiva pink. Ears: Normal auditory acuity Lungs: Clear throughout to auscultation; no W/R/R. Heart: Regular rate and rhythm; no M/R/G. Abdomen: Soft, non-distended.  BS present.  Non-tender. Rectal:  Will be done at the time of colonoscopy. Musculoskeletal: Symmetrical with no gross deformities  Skin: No lesions on visible extremities Extremities: No edema  Neurological: Alert oriented x 4, grossly non-focal Psychological:  Alert and cooperative. Normal  mood and affect Assessment & Plan History of diverticular gastrointestinal bleed No recurrent bleeding since September. Previous GI bleed presumed to be diverticular in origin. No current symptoms such as abdominal pain or hematochezia. Discussed potential for future bleeding due to diverticular disease, which can occur unpredictably. Emphasized importance of maintaining soft stools to prevent recurrence.  Hgb was 13.4 grams in early October at PCPs office. - Ensure stool softness and avoid straining during bowel movements.  Colonic polyps, surveillance Overdue for colonoscopy. Last colonoscopy in 2017 revealed a 9 mm polyp, tubular adenoma. Surveillance colonoscopy recommended every 5 years due to history of polyps/size of polyp.  - Colonoscopy scheduled with Dr. Nandigam.  The risks, benefits, and alternatives to colonoscopy were discussed with the patient and he consents to proceed.    CC:  Clarice Nottingham, MD

## 2024-03-01 NOTE — Patient Instructions (Signed)
 You have been scheduled for a colonoscopy. Please follow written instructions given to you at your visit today.   If you use inhalers (even only as needed), please bring them with you on the day of your procedure.  DO NOT TAKE 7 DAYS PRIOR TO TEST- Trulicity (dulaglutide) Ozempic, Wegovy (semaglutide) Mounjaro, Zepbound (tirzepatide) Bydureon Bcise (exanatide extended release)  DO NOT TAKE 1 DAY PRIOR TO YOUR TEST Rybelsus (semaglutide) Adlyxin (lixisenatide) Victoza (liraglutide) Byetta (exanatide) ___________________________________________________________________________   If your blood pressure at your visit was 140/90 or greater, please contact your primary care physician to follow up on this.  _______________________________________________________  If you are age 74 or older, your body mass index should be between 23-30. Your Body mass index is 29.39 kg/m. If this is out of the aforementioned range listed, please consider follow up with your Primary Care Provider.  If you are age 23 or younger, your body mass index should be between 19-25. Your Body mass index is 29.39 kg/m. If this is out of the aformentioned range listed, please consider follow up with your Primary Care Provider.   ________________________________________________________  The [facility] providers would like to encourage you to use MYCHART to communicate with providers for non-urgent requests or questions.  Due to long hold times on the telephone, sending your provider a message by [facility] may be a faster and more efficient way to get a response.  Please allow 48 business hours for a response.  Please remember that this is for non-urgent requests.  _______________________________________________________  [facility] is using a team-based approach to care.  Your team is made up of your doctor and two to three APPS. Our APPS (Nurse Practitioners and Physician Assistants) work with your physician to  ensure care continuity for you. They are fully qualified to address your health concerns and develop a treatment plan. They communicate directly with your gastroenterologist to care for you. Seeing the Advanced Practice Practitioners on your physician's team can help you by facilitating care more promptly, often allowing for earlier appointments, access to diagnostic testing, procedures, and other specialty referrals.

## 2024-03-15 ENCOUNTER — Telehealth: Payer: Self-pay | Admitting: Gastroenterology

## 2024-03-15 MED ORDER — NA SULFATE-K SULFATE-MG SULF 17.5-3.13-1.6 GM/177ML PO SOLN: 1.0000 | Freq: Once | ORAL | 0 refills | Status: AC

## 2024-03-15 NOTE — Telephone Encounter (Signed)
 Sent new instructions via Mychart. Suprep sent to [employer].

## 2024-03-15 NOTE — Telephone Encounter (Signed)
 Patient needing updated prep instructions. Also requesting prep med sent to [facility] on [address]. Also requesting f/u call.

## 2024-03-22 ENCOUNTER — Encounter: Payer: Self-pay | Admitting: Gastroenterology

## 2024-03-22 ENCOUNTER — Ambulatory Visit: Admitting: Gastroenterology

## 2024-03-22 VITALS — BP 145/79 | HR 61 | Temp 97.9°F | Resp 12 | Ht 69.0 in | Wt 199.0 lb

## 2024-03-22 DIAGNOSIS — D122 Benign neoplasm of ascending colon: Secondary | ICD-10-CM | POA: Diagnosis not present

## 2024-03-22 DIAGNOSIS — Z860100 Personal history of colon polyps, unspecified: Secondary | ICD-10-CM

## 2024-03-22 MED ORDER — SODIUM CHLORIDE 0.9 % IV SOLN: 500.0000 mL | INTRAVENOUS | Status: AC

## 2024-03-22 NOTE — Progress Notes (Signed)
 Called to room to assist during endoscopic procedure.  Patient ID and intended procedure confirmed with present staff. Received instructions for my participation in the procedure from the performing physician.

## 2024-03-22 NOTE — Progress Notes (Unsigned)
 Vss nad trans to pacu

## 2024-03-22 NOTE — Progress Notes (Unsigned)
 [facility] History and Physical   Primary Care Physician:  Clarice Nottingham, MD   Reason for Procedure:  History of adenomatous colon polyps  Plan:    Surveillance colonoscopy with possible interventions as needed     HPI: Luke Smith is a very pleasant 63 y.o. adult here for surveillance colonoscopy. Denies any nausea, vomiting, abdominal pain, melena or bright red blood per rectum  The risks and benefits as well as alternatives of endoscopic procedure(s) have been discussed and reviewed.  The patient was provided an opportunity to ask questions and all were answered. The patient agreed with the plan and demonstrated an understanding of the instructions.   Past Medical History:  Diagnosis Date   Coronary artery disease    a.01/17/16: 4V CABG (LIMA to LAD, RIMA to RCA, SVG to D1, SVG to OM)   b. 07/2016: 2/4 patent bypass grafts. SVG--> OM down, s/p DES to OM, SVG--> D2 down but 50% occl D2 with good native flow   GERD (gastroesophageal reflux disease)    History of kidney stones    Hypertension    Kidney stone    Perforated ear drum    left ear   Sleep apnea     Past Surgical History:  Procedure Laterality Date   CARDIAC CATHETERIZATION N/A 01/16/2016   Procedure: Left Heart Cath and Coronary Angiography;  Surgeon: Victory LELON Sharps, MD;  Location: [facility] INVASIVE CV LAB;  Service: Cardiovascular;  Laterality: N/A;   CORONARY ARTERY BYPASS GRAFT N/A 01/17/2016   Procedure: CORONARY ARTERY BYPASS GRAFTING (CABG) x 4 (LIMA to LAD, SVG to DIAGONAL, SVG to OM, and RIMA to RCA) with EVH from RIGHT THIGH GREATER SAPHENOUS VEIN and BILATERAL MAMMARY HARVEST;  Surgeon: Dorise MARLA Fellers, MD;  Location: [facility] OR;  Service: Open Heart Surgery;  Laterality: N/A;   CORONARY STENT INTERVENTION N/A 07/14/2016   Procedure: Coronary Stent Intervention;  Surgeon: Candyce GORMAN Reek, MD;  Location: [facility] INVASIVE CV LAB;  Service: Cardiovascular;  Laterality: N/A;   CYSTOSCOPY/URETEROSCOPY/HOLMIUM  LASER/STENT PLACEMENT Bilateral 05/13/2021   Procedure: CYSTOSCOPY/ RETROGRADE/URETEROSCOPY/HOLMIUM LASER/STENT PLACEMENT;  Surgeon: Renda Glance, MD;  Location: [facility] ORS;  Service: Urology;  Laterality: Bilateral;   DENTAL SURGERY  childhood   LEFT HEART CATH AND CORS/GRAFTS ANGIOGRAPHY N/A 07/14/2016   Procedure: Left Heart Cath and Cors/Grafts Angiography;  Surgeon: Candyce GORMAN Reek, MD;  Location: [facility] INVASIVE CV LAB;  Service: Cardiovascular;  Laterality: N/A;   NASAL SEPTUM SURGERY     TEE WITHOUT CARDIOVERSION N/A 01/17/2016   Procedure: TRANSESOPHAGEAL ECHOCARDIOGRAM (TEE);  Surgeon: Dorise MARLA Fellers, MD;  Location: [facility] OR;  Service: Open Heart Surgery;  Laterality: N/A;    Prior to Admission medications   Medication Sig Start Date End Date Taking? Authorizing Provider  Ascorbic Acid (VITAMIN C) 1000 MG tablet Take 1,000 mg by mouth 2 (two) times daily.   Yes [provider]  Cholecalciferol  (VITAMIN D3) 5000 units CAPS Take 5,000 Units by mouth daily.   Yes [provider]  fluticasone (FLONASE) 50 MCG/ACT nasal spray 1 spray in each nostril Nasally Once a day; Duration: 30 days 01/13/24  Yes [provider]  HYDROcodone-acetaminophen  (NORCO/VICODIN) 5-325 MG tablet Take 1 tablet by mouth every 6 (six) hours as needed (for back pain).    Yes [provider]  nebivolol (BYSTOLIC) 5 MG tablet Take 5 mg by mouth daily.   Yes [provider]  amLODipine  (NORVASC ) 10 MG tablet Take 1 tablet (10 mg total) by mouth daily. Patient  not taking: Reported on 03/22/2024 12/27/23 03/26/24  [place], Eric J, DO  Magnesium  400 MG CAPS Take 1 tablet by mouth daily. Patient not taking: Reported on 03/22/2024    [provider]  nitroGLYCERIN  (NITROSTAT ) 0.4 MG SL tablet Place 1 tablet (0.4 mg total) under the tongue every 5 (five) minutes as needed for chest pain. 11/05/16   Court Dorn PARAS, MD  SILDENAFIL CITRATE PO Take 1-5 tablets by mouth as needed.     [provider]  tamsulosin (FLOMAX) 0.4 MG CAPS capsule Take 0.4 mg by mouth.    [provider]  zolpidem  (AMBIEN ) 10 MG tablet Take 10 mg by mouth at bedtime as needed for sleep.    [provider]    Current Outpatient Medications  Medication Sig Dispense Refill   Ascorbic Acid (VITAMIN C) 1000 MG tablet Take 1,000 mg by mouth 2 (two) times daily.     Cholecalciferol  (VITAMIN D3) 5000 units CAPS Take 5,000 Units by mouth daily.     fluticasone (FLONASE) 50 MCG/ACT nasal spray 1 spray in each nostril Nasally Once a day; Duration: 30 days     HYDROcodone-acetaminophen  (NORCO/VICODIN) 5-325 MG tablet Take 1 tablet by mouth every 6 (six) hours as needed (for back pain).      nebivolol (BYSTOLIC) 5 MG tablet Take 5 mg by mouth daily.     amLODipine  (NORVASC ) 10 MG tablet Take 1 tablet (10 mg total) by mouth daily. (Patient not taking: Reported on 03/22/2024) 90 tablet 0   Magnesium  400 MG CAPS Take 1 tablet by mouth daily. (Patient not taking: Reported on 03/22/2024)     nitroGLYCERIN  (NITROSTAT ) 0.4 MG SL tablet Place 1 tablet (0.4 mg total) under the tongue every 5 (five) minutes as needed for chest pain. 25 tablet 1   SILDENAFIL CITRATE PO Take 1-5 tablets by mouth as needed.     tamsulosin (FLOMAX) 0.4 MG CAPS capsule Take 0.4 mg by mouth.     zolpidem  (AMBIEN ) 10 MG tablet Take 10 mg by mouth at bedtime as needed for sleep.     Current Facility-Administered Medications  Medication Dose Route Frequency Provider Last Rate Last Admin   0.9 %  sodium chloride  infusion  500 mL Intravenous Continuous Antanisha Mohs V, MD        Allergies as of 03/22/2024 - Review Complete 03/22/2024  Allergen Reaction Noted   Aspirin  Other (See Comments)     Family History  Problem Relation Age of Onset   Bone cancer Father    Colon cancer Neg Hx     Social History   Socioeconomic History   Marital status: Married    Spouse name: Not on file   Number of children: 2    Years of education: Not on file   Highest education level: Not on file  Occupational History   Occupation: art gallery manager  Tobacco Use   Smoking status: Former    Current packs/day: 0.00    Average packs/day: 0.5 packs/day for 20.0 years (10.0 ttl pk-yrs)    Types: Cigarettes    Start date: 04/14/1981    Quit date: 04/14/2001    Years since quitting: 22.9   Smokeless tobacco: Never  Substance and Sexual Activity   Alcohol use: Yes    Alcohol/week: 6.0 standard drinks of alcohol    Types: 6 Cans of beer per week    Comment: social   Drug use: No   Sexual activity: Not on file  Other Topics Concern   Not  on file  Social History Narrative   Married for 37 years.  Works as a art gallery manager for [employer]: Not on [employer] Insecurity: No Food Insecurity (12/26/2023)   Hunger Vital Sign    Worried About Running Out of Food in the Last Year: Never true    Ran Out of Food in the Last Year: Never true  Transportation Needs: No Transportation Needs (12/26/2023)   PRAPARE - Administrator, Civil Service (Medical): No    Lack of Transportation (Non-Medical): No  Physical Activity: Not on file  Stress: Not on file  Social Connections: Not on file  Intimate Partner Violence: Not At Risk (12/26/2023)   Humiliation, Afraid, Rape, and Kick questionnaire    Fear of Current or Ex-Partner: No    Emotionally Abused: No    Physically Abused: No    Sexually Abused: No    Review of Systems:  All other review of systems negative except as mentioned in the HPI.  Physical Exam: Vital signs in last 24 hours: BP (!) 161/83   Pulse (!) 56   Temp 97.9 F (36.6 C) (Temporal)   Ht 5' 9 (1.753 m)   Wt 199 lb (90.3 kg)   SpO2 100%   BMI 29.39 kg/m  General:   Alert, NAD Lungs:  Clear .   Heart:  Regular rate and rhythm Abdomen:  Soft, nontender and nondistended. Neuro/Psych:  Alert and cooperative. Normal mood and affect. A and O x  3  Reviewed labs, radiology imaging, old records and pertinent past GI work up  Patient is appropriate for planned procedure(s) and anesthesia in an ambulatory setting   K. Veena Brady Plant , MD [phone]

## 2024-03-22 NOTE — Patient Instructions (Signed)
 YOU HAD AN ENDOSCOPIC PROCEDURE TODAY AT THE  ENDOSCOPY CENTER:   Refer to the procedure report that was given to you for any specific questions about what was found during the examination.  If the procedure report does not answer your questions, please call your gastroenterologist to clarify.  If you requested that your care partner not be given the details of your procedure findings, then the procedure report has been included in a sealed envelope for you to review at your convenience later.  YOU SHOULD EXPECT: Some feelings of bloating in the abdomen. Passage of more gas than usual.  Walking can help get rid of the air that was put into your GI tract during the procedure and reduce the bloating. If you had a lower endoscopy (such as a colonoscopy or flexible sigmoidoscopy) you may notice spotting of blood in your stool or on the toilet paper. If you underwent a bowel prep for your procedure, you may not have a normal bowel movement for a few days.  Please Note:  You might notice some irritation and congestion in your nose or some drainage.  This is from the oxygen used during your procedure.  There is no need for concern and it should clear up in a day or so.  SYMPTOMS TO REPORT IMMEDIATELY:  Following lower endoscopy (colonoscopy or flexible sigmoidoscopy):  Excessive amounts of blood in the stool  Significant tenderness or worsening of abdominal pains  Swelling of the abdomen that is new, acute  Fever of 100F or higher   For urgent or emergent issues, a gastroenterologist can be reached at any hour by calling [phone]. Do not use MyChart messaging for urgent concerns.    DIET:  We do recommend a small meal at first, but then you may proceed to your regular diet.  Drink plenty of fluids but you should avoid alcoholic beverages for 24 hours.  MEDICATIONS: Continue present medications.  FOLLOW UP: Await pathology results. Repeat colonoscopy in 7 years for surveillance based on  pathology results.  Educational handouts given to patient: Polyps, Diverticulosis, Hemorrhoids.  Thank you for allowing us  to provide for your healthcare needs today.  ACTIVITY:  You should plan to take it easy for the rest of today and you should NOT DRIVE or use heavy machinery until tomorrow (because of the sedation medicines used during the test).    FOLLOW UP: Our staff will call the number listed on your records the next business day following your procedure.  We will call around 7:15- 8:00 am to check on you and address any questions or concerns that you may have regarding the information given to you following your procedure. If we do not reach you, we will leave a message.     If any biopsies were taken you will be contacted by phone or by letter within the next 1-3 weeks.  Please call us  at [phone] if you have not heard about the biopsies in 3 weeks.    SIGNATURES/CONFIDENTIALITY: You and/or your care partner have signed paperwork which will be entered into your electronic medical record.  These signatures attest to the fact that that the information above on your After Visit Summary has been reviewed and is understood.  Full responsibility of the confidentiality of this discharge information lies with you and/or your care-partner.

## 2024-03-22 NOTE — Op Note (Signed)
 [facility] Patient Name: Luke Smith Procedure Date: 03/22/2024 1:07 PM MRN: [id number] Endoscopist: Gustav ALONSO Mcgee , MD, [id number] Age: 63 Referring MD:  Date of Birth: 1961/02/15 Gender: Male Account #: [id number] Procedure:                Colonoscopy Indications:              High risk colon cancer surveillance: Personal                            history of colonic polyps, High risk colon cancer                            surveillance: Personal history of adenoma less than                            10 mm in size Medicines:                Monitored Anesthesia Care Procedure:                Pre-Anesthesia Assessment:                           - Prior to the procedure, a History and Physical                            was performed, and patient medications and                            allergies were reviewed. The patient's tolerance of                            previous anesthesia was also reviewed. The risks                            and benefits of the procedure and the sedation                            options and risks were discussed with the patient.                            All questions were answered, and informed consent                            was obtained. Prior Anticoagulants: The patient has                            taken no anticoagulant or antiplatelet agents. ASA                            Grade Assessment: III - A patient with severe                            systemic disease. After reviewing the risks and  benefits, the patient was deemed in satisfactory                            condition to undergo the procedure.                           After obtaining informed consent, the colonoscope                            was passed under direct vision. Throughout the                            procedure, the patient's blood pressure, pulse, and                            oxygen saturations were monitored  continuously. The                            Olympus Scope [id number] was introduced through the                            anus and advanced to the the cecum, identified by                            appendiceal orifice and ileocecal valve. The                            colonoscopy was performed without difficulty. The                            patient tolerated the procedure well. The quality                            of the bowel preparation was adequate. The                            ileocecal valve, appendiceal orifice, and rectum                            were photographed. Scope In: 1:22:25 PM Scope Out: 1:40:01 PM Scope Withdrawal Time: 0 hours 12 minutes 58 seconds  Total Procedure Duration: 0 hours 17 minutes 36 seconds  Findings:                 The perianal and digital rectal examinations were                            normal.                           A 4 mm polyp was found in the ascending colon. The                            polyp was sessile. The polyp was removed with a  cold snare. Resection and retrieval were complete.                           Scattered large-mouthed, medium-mouthed and                            small-mouthed diverticula were found in the sigmoid                            colon, descending colon, transverse colon and                            ascending colon.                           Non-bleeding external and internal hemorrhoids were                            found during retroflexion. The hemorrhoids were                            medium-sized. Complications:            No immediate complications. Estimated Blood Loss:     Estimated blood loss was minimal. Impression:               - One 4 mm polyp in the ascending colon, removed                            with a cold snare. Resected and retrieved.                           - Diverticulosis in the sigmoid colon, in the                            descending  colon, in the transverse colon and in                            the ascending colon.                           - Non-bleeding external and internal hemorrhoids. Recommendation:           - Resume previous diet.                           - Continue present medications.                           - Await pathology results.                           - Repeat colonoscopy in 7 years for surveillance                            based on pathology results. Sarp Vernier V. Sanyiah Kanzler, MD 03/22/2024 1:46:53 PM This report has been signed electronically.

## 2024-03-23 ENCOUNTER — Encounter: Payer: Self-pay | Admitting: Gastroenterology

## 2024-03-23 ENCOUNTER — Telehealth: Payer: Self-pay

## 2024-03-23 NOTE — Telephone Encounter (Signed)
°  Follow up Call-     03/22/2024   12:25 PM  Call back number  Post procedure Call Back phone  # [phone]  Permission to leave phone message Yes     Patient questions:  Do you have a fever, pain , or abdominal swelling? No. Pain Score  0 *  Have you tolerated food without any problems? Yes.    Have you been able to return to your normal activities? Yes.    Do you have any questions about your discharge instructions: Diet   No. Medications  No. Follow up visit  No.  Do you have questions or concerns about your Care? No.  Actions: * If pain score is 4 or above: No action needed, pain <4.

## 2024-03-25 LAB — SURGICAL PATHOLOGY

## 2024-04-20 ENCOUNTER — Encounter: Admitting: Gastroenterology

## 2024-05-13 ENCOUNTER — Ambulatory Visit: Payer: Self-pay | Admitting: Gastroenterology
